# Patient Record
Sex: Female | Born: 1935 | Race: White | Hispanic: No | Marital: Married | Smoking: Never smoker
Health system: Southern US, Community
[De-identification: ages and names within clinical notes are randomized; demographics above are authoritative.]

## PROBLEM LIST (undated history)

## (undated) DIAGNOSIS — E039 Hypothyroidism, unspecified: Secondary | ICD-10-CM

## (undated) DIAGNOSIS — E785 Hyperlipidemia, unspecified: Secondary | ICD-10-CM

## (undated) DIAGNOSIS — F419 Anxiety disorder, unspecified: Secondary | ICD-10-CM

## (undated) DIAGNOSIS — M549 Dorsalgia, unspecified: Secondary | ICD-10-CM

## (undated) DIAGNOSIS — K279 Peptic ulcer, site unspecified, unspecified as acute or chronic, without hemorrhage or perforation: Secondary | ICD-10-CM

## (undated) DIAGNOSIS — Z973 Presence of spectacles and contact lenses: Secondary | ICD-10-CM

## (undated) DIAGNOSIS — K219 Gastro-esophageal reflux disease without esophagitis: Secondary | ICD-10-CM

## (undated) DIAGNOSIS — G4700 Insomnia, unspecified: Secondary | ICD-10-CM

## (undated) DIAGNOSIS — M1990 Unspecified osteoarthritis, unspecified site: Secondary | ICD-10-CM

## (undated) DIAGNOSIS — I10 Essential (primary) hypertension: Secondary | ICD-10-CM

## (undated) DIAGNOSIS — R413 Other amnesia: Secondary | ICD-10-CM

## (undated) DIAGNOSIS — I499 Cardiac arrhythmia, unspecified: Secondary | ICD-10-CM

## (undated) DIAGNOSIS — I4891 Unspecified atrial fibrillation: Secondary | ICD-10-CM

## (undated) HISTORY — DX: Gastro-esophageal reflux disease without esophagitis: K21.9

## (undated) HISTORY — DX: Unspecified atrial fibrillation: I48.91

## (undated) HISTORY — PX: ESOPHAGOGASTRODUODENOSCOPY: SHX1529

## (undated) HISTORY — PX: COLONOSCOPY: SHX174

## (undated) HISTORY — DX: Peptic ulcer, site unspecified, unspecified as acute or chronic, without hemorrhage or perforation: K27.9

## (undated) HISTORY — DX: Dorsalgia, unspecified: M54.9

## (undated) HISTORY — DX: Hypothyroidism, unspecified: E03.9

## (undated) HISTORY — PX: OTHER SURGICAL HISTORY: SHX169

## (undated) HISTORY — DX: Hyperlipidemia, unspecified: E78.5

## (undated) HISTORY — DX: Unspecified osteoarthritis, unspecified site: M19.90

---

## 1997-06-17 ENCOUNTER — Ambulatory Visit (HOSPITAL_BASED_OUTPATIENT_CLINIC_OR_DEPARTMENT_OTHER): Admission: RE | Admit: 1997-06-17 | Discharge: 1997-06-17 | Payer: Self-pay | Admitting: *Deleted

## 1997-09-23 ENCOUNTER — Ambulatory Visit (HOSPITAL_BASED_OUTPATIENT_CLINIC_OR_DEPARTMENT_OTHER): Admission: RE | Admit: 1997-09-23 | Discharge: 1997-09-23 | Payer: Self-pay | Admitting: *Deleted

## 2002-01-30 ENCOUNTER — Ambulatory Visit (HOSPITAL_COMMUNITY): Admission: RE | Admit: 2002-01-30 | Discharge: 2002-01-30 | Payer: Self-pay | Admitting: Gastroenterology

## 2002-09-05 ENCOUNTER — Inpatient Hospital Stay (HOSPITAL_COMMUNITY): Admission: EM | Admit: 2002-09-05 | Discharge: 2002-09-07 | Payer: Self-pay | Admitting: Emergency Medicine

## 2002-09-06 ENCOUNTER — Encounter (INDEPENDENT_AMBULATORY_CARE_PROVIDER_SITE_OTHER): Payer: Self-pay | Admitting: Specialist

## 2007-09-12 ENCOUNTER — Ambulatory Visit (HOSPITAL_COMMUNITY): Admission: RE | Admit: 2007-09-12 | Discharge: 2007-09-12 | Payer: Self-pay | Admitting: Family Medicine

## 2007-09-12 ENCOUNTER — Ambulatory Visit: Payer: Self-pay | Admitting: Vascular Surgery

## 2009-07-03 ENCOUNTER — Encounter: Payer: Self-pay | Admitting: Cardiology

## 2009-07-03 ENCOUNTER — Ambulatory Visit: Payer: Self-pay | Admitting: Cardiology

## 2009-07-03 ENCOUNTER — Ambulatory Visit (HOSPITAL_COMMUNITY): Admission: RE | Admit: 2009-07-03 | Discharge: 2009-07-03 | Payer: Self-pay | Admitting: Cardiology

## 2009-07-03 ENCOUNTER — Encounter (INDEPENDENT_AMBULATORY_CARE_PROVIDER_SITE_OTHER): Payer: Self-pay | Admitting: *Deleted

## 2009-07-03 ENCOUNTER — Ambulatory Visit: Payer: Self-pay

## 2009-07-03 DIAGNOSIS — E039 Hypothyroidism, unspecified: Secondary | ICD-10-CM | POA: Insufficient documentation

## 2009-07-03 DIAGNOSIS — M810 Age-related osteoporosis without current pathological fracture: Secondary | ICD-10-CM

## 2009-07-03 DIAGNOSIS — I4891 Unspecified atrial fibrillation: Secondary | ICD-10-CM | POA: Insufficient documentation

## 2009-07-03 DIAGNOSIS — K219 Gastro-esophageal reflux disease without esophagitis: Secondary | ICD-10-CM

## 2009-07-03 DIAGNOSIS — M549 Dorsalgia, unspecified: Secondary | ICD-10-CM | POA: Insufficient documentation

## 2009-07-03 DIAGNOSIS — E785 Hyperlipidemia, unspecified: Secondary | ICD-10-CM

## 2009-07-03 DIAGNOSIS — M1710 Unilateral primary osteoarthritis, unspecified knee: Secondary | ICD-10-CM

## 2009-07-11 DIAGNOSIS — R079 Chest pain, unspecified: Secondary | ICD-10-CM

## 2009-07-14 ENCOUNTER — Telehealth: Payer: Self-pay | Admitting: Cardiology

## 2009-07-21 ENCOUNTER — Ambulatory Visit: Payer: Self-pay | Admitting: Cardiology

## 2009-07-21 DIAGNOSIS — I10 Essential (primary) hypertension: Secondary | ICD-10-CM

## 2009-07-25 ENCOUNTER — Ambulatory Visit: Payer: Self-pay | Admitting: Internal Medicine

## 2009-07-25 LAB — CONVERTED CEMR LAB: POC INR: 1.3

## 2009-07-31 ENCOUNTER — Ambulatory Visit: Payer: Self-pay | Admitting: Cardiology

## 2009-08-01 ENCOUNTER — Telehealth: Payer: Self-pay | Admitting: Cardiology

## 2009-08-02 ENCOUNTER — Emergency Department (HOSPITAL_COMMUNITY): Admission: EM | Admit: 2009-08-02 | Discharge: 2009-08-02 | Payer: Self-pay | Admitting: Emergency Medicine

## 2009-08-07 ENCOUNTER — Ambulatory Visit: Payer: Self-pay | Admitting: Internal Medicine

## 2009-08-14 ENCOUNTER — Ambulatory Visit: Payer: Self-pay

## 2009-08-25 ENCOUNTER — Ambulatory Visit: Payer: Self-pay | Admitting: Cardiology

## 2009-08-25 LAB — CONVERTED CEMR LAB: POC INR: 1.6

## 2009-09-01 ENCOUNTER — Ambulatory Visit: Payer: Self-pay | Admitting: Cardiology

## 2009-09-01 LAB — CONVERTED CEMR LAB: POC INR: 1.7

## 2009-09-11 ENCOUNTER — Ambulatory Visit: Payer: Self-pay | Admitting: Internal Medicine

## 2009-09-17 ENCOUNTER — Ambulatory Visit: Payer: Self-pay | Admitting: Internal Medicine

## 2009-09-17 ENCOUNTER — Ambulatory Visit: Payer: Self-pay | Admitting: Cardiology

## 2009-09-29 ENCOUNTER — Ambulatory Visit: Payer: Self-pay | Admitting: Cardiology

## 2009-10-13 ENCOUNTER — Ambulatory Visit: Payer: Self-pay | Admitting: Cardiovascular Disease

## 2009-10-13 LAB — CONVERTED CEMR LAB: POC INR: 1.8

## 2009-10-27 ENCOUNTER — Ambulatory Visit: Payer: Self-pay | Admitting: Cardiology

## 2009-11-12 ENCOUNTER — Ambulatory Visit: Payer: Self-pay | Admitting: Cardiovascular Disease

## 2009-11-24 ENCOUNTER — Ambulatory Visit: Payer: Self-pay | Admitting: Cardiovascular Disease

## 2009-11-24 LAB — CONVERTED CEMR LAB: POC INR: 2.6

## 2009-12-15 ENCOUNTER — Ambulatory Visit: Payer: Self-pay | Admitting: Cardiology

## 2009-12-15 LAB — CONVERTED CEMR LAB: POC INR: 2.7

## 2010-01-12 ENCOUNTER — Ambulatory Visit: Payer: Self-pay | Admitting: Internal Medicine

## 2010-01-12 LAB — CONVERTED CEMR LAB
INR: 2.8
POC INR: 2.8

## 2010-02-09 ENCOUNTER — Ambulatory Visit: Payer: Self-pay | Admitting: Internal Medicine

## 2010-03-05 ENCOUNTER — Ambulatory Visit: Payer: Self-pay | Admitting: Cardiology

## 2010-03-05 LAB — CONVERTED CEMR LAB: POC INR: 2.6

## 2010-04-02 ENCOUNTER — Ambulatory Visit: Admission: RE | Admit: 2010-04-02 | Discharge: 2010-04-02 | Payer: Self-pay | Source: Home / Self Care

## 2010-04-07 NOTE — Medication Information (Signed)
Summary: rov/sp  Anticoagulant Therapy  Managed by: Weston Brass, PharmD Referring MD: Daleen Squibb PCP: Dewain Penning Supervising MD: Antoine Poche MD, Fayrene Fearing Indication 1: Atrial Fibrillation Lab Used: [facility] [place] Site: [address] INR POC 2.3 INR RANGE 2.0-3.0  Dietary changes: no    Health status changes: no    Bleeding/hemorrhagic complications: no    Recent/future hospitalizations: no    Any changes in medication regimen? no    Recent/future dental: no  Any missed doses?: no       Is patient compliant with meds? yes       Allergies: 1)  ! Asa 2)  ! Codeine 3)  ! Sulfa  Anticoagulation Management History:      The patient is taking warfarin and comes in today for a routine follow up visit.  Positive risk factors for bleeding include an age of 75 years or older.  The bleeding index is 'intermediate risk'.  Positive CHADS2 values include History of HTN and Age > 18 years old.  Anticoagulation responsible provider: Antoine Poche MD, Fayrene Fearing.  INR POC: 2.3.  Cuvette Lot#: [id number].  Exp: 12/2010.    Anticoagulation Management Assessment/Plan:      The patient's current anticoagulation dose is Warfarin sodium 2.5 mg tabs: 1 once daily  AS DIRECTED.  The target INR is 2.0-3.0.  The next INR is due 11/12/2009.  Anticoagulation instructions were given to patient.  Results were reviewed/authorized by Weston Brass, PharmD.  She was notified by Liana Gerold, PharmD Candidate.         Prior Anticoagulation Instructions: INR 1.8  Take 2 1/2 tablets today then increase dose to 2 tablets every day except 1 1/2 tablets on Tuesday and Saturday.   Current Anticoagulation Instructions: INR 2.3  Continue 2 tablets daily except 1.5 tablets on Tue and Sat.  Return to clinic in 2 weeks.

## 2010-04-07 NOTE — Medication Information (Signed)
Summary: rov/cb  Anticoagulant Therapy  Managed by: Cloyde Reams, RN, BSN Referring MD: Daleen Squibb PCP: Dewain Penning Supervising MD: Ladona Ridgel MD, Sharlot Gowda Indication 1: Atrial Fibrillation Lab Used: [facility] [place] Site: [address] INR POC 1.6 INR RANGE 2.0-3.0  Dietary changes: no    Health status changes: no    Bleeding/hemorrhagic complications: no    Recent/future hospitalizations: no    Any changes in medication regimen? no    Recent/future dental: no  Any missed doses?: no       Is patient compliant with meds? yes       Allergies: 1)  ! Asa 2)  ! Codeine 3)  ! Sulfa  Anticoagulation Management History:      The patient is taking warfarin and comes in today for a routine follow up visit.  Positive risk factors for bleeding include an age of 75 years or older.  The bleeding index is 'intermediate risk'.  Positive CHADS2 values include History of HTN and Age > 12 years old.  Anticoagulation responsible provider: Ladona Ridgel MD, Sharlot Gowda.  INR POC: 1.6.  Cuvette Lot#: [id number].  Exp: 11/2010.    Anticoagulation Management Assessment/Plan:      The patient's current anticoagulation dose is Warfarin sodium 2.5 mg tabs: 1 once daily  AS DIRECTED.  The target INR is 2.0-3.0.  The next INR is due 09/17/2009.  Anticoagulation instructions were given to patient.  Results were reviewed/authorized by Cloyde Reams, RN, BSN.  She was notified by Cloyde Reams, RN, BSN.         Prior Anticoagulation Instructions: INR 1.7. Take 2 tablets today, then take 1.5 tablets daily except 1 tablet on Mon and Fri. Recheck in 7-10 days.  Current Anticoagulation Instructions: INR 1.6  Start taking 1.5 tablets daily. Recheck in 10 days.

## 2010-04-07 NOTE — Medication Information (Signed)
Summary: rov/sp  Anticoagulant Therapy  Managed by: Eda Keys, PharmD Referring MD: Daleen Squibb PCP: Dewain Penning Supervising MD: Shirlee Latch MD, Hodari Chuba Indication 1: Atrial Fibrillation Lab Used: [facility] [place] Site: [address] INR POC 1.6 INR RANGE 2.0-3.0  Dietary changes: yes       Details: A couple extra servings of cabbage and slaw this week.    Health status changes: no    Bleeding/hemorrhagic complications: no    Recent/future hospitalizations: no    Any changes in medication regimen? no    Recent/future dental: no  Any missed doses?: no       Is patient compliant with meds? yes      Comments: Patient has been taking 1.5 tablets only on TTS, vs STTS as recorded at last visit.   Allergies: 1)  ! Asa 2)  ! Codeine 3)  ! Sulfa  Anticoagulation Management History:      The patient is taking warfarin and comes in today for a routine follow up visit.  Positive risk factors for bleeding include an age of 5 years or older.  The bleeding index is 'intermediate risk'.  Positive CHADS2 values include History of HTN and Age > 14 years old.  Anticoagulation responsible provider: Shirlee Latch MD, Kholton Coate.  INR POC: 1.6.  Cuvette Lot#: [id number].  Exp: 11/2010.    Anticoagulation Management Assessment/Plan:      The patient's current anticoagulation dose is Warfarin sodium 2.5 mg tabs: 1 once daily  AS DIRECTED.  The target INR is 2.0-3.0.  The next INR is due 09/01/2009.  Anticoagulation instructions were given to patient.  Results were reviewed/authorized by Eda Keys, PharmD.  She was notified by Eda Keys.         Prior Anticoagulation Instructions: INR 1.7  Take 2 tablets today then increase dose to 1 1/2 tablets every day except 1 tablet on Monday, Wednesday and Friday   Current Anticoagulation Instructions: INR 1.6  Take 2 tablets today.  Then start NEW dosing schedule of 1 tablet on Monday, Wednesday and Friday and 1.5 tablets on Sunday,  Tuesday, Thursday, and Saturday.  Return to clinic in 1 week.

## 2010-04-07 NOTE — Assessment & Plan Note (Signed)
Summary: per check out/sf   Visit Type:  rov Primary Provider:  Dewain Penning  CC:  fatigue.....  History of Present Illness: Amanda [name] comes in today for further evaluation and management of her tachycardia and palpitations. she carries a diagnosis of atrial fibrillation.  Since increasing her metoprolol to 50 mg a day, she has had very few symptoms. She may have a little bit more fatigue but it is not a significant issue per her daughter. Her biggest complaint is that of constipation. She takes milk of magnesia with resultant diarrhea.  She is tolerating Coumadin well. No bleeding.  Her blood pressure has also been under better control.  Current Medications (verified): 1)  Calcium-Vitamin D 600-200 Mg-Unit Tabs (Calcium-Vitamin D) .Marland Kitchen.. 1 Tab Three Times A Day 2)  Centrum  Tabs (Multiple Vitamins-Minerals) .Marland Kitchen.. 1 Tab Once Daily 3)  Fish Oil 1000 Mg Caps (Omega-3 Fatty Acids) .Marland Kitchen.. 1 Cap Two Times A Day 4)  Garlic Oil 1000 Mg Caps (Garlic) .Marland Kitchen.. 1 Cap Once Daily 5)  Lorazepam 0.5 Mg Tabs (Lorazepam) .... 1/2-1 Tab At Bedtime As Needed 6)  Red Yeast Rice 600 Mg Caps (Red Yeast Rice Extract) .Marland Kitchen.. 1 Cap Once Daily 7)  Synthroid 75 Mcg Tabs (Levothyroxine Sodium) .Marland Kitchen.. 1 Tab Once Daily 8)  Turmeric Curcumin  Caps (Misc Natural Products) .Marland Kitchen.. 1 Cap Once Daily 9)  Vitamin D 400 Unit Caps (Cholecalciferol) .Marland Kitchen.. 1 Cap Once Daily 10)  Vitamin E 400 Unit Caps (Vitamin E) .Marland Kitchen.. 1 Cap Once Daily 11)  Folic Acid 400 Mcg Tabs (Folic Acid) .Marland Kitchen.. 1 Tab Once Daily 12)  Cinnamon 500 Mg Caps (Cinnamon) .Marland Kitchen.. 1 Cap Once Daily 13)  Metoprolol Succinate 50 Mg Xr24h-Tab (Metoprolol Succinate) .Marland Kitchen.. 1 Once Daily 14)  Warfarin Sodium 2.5 Mg Tabs (Warfarin Sodium) .Marland Kitchen.. 1 Once Daily  As Directed  Allergies: 1)  ! Asa 2)  ! Codeine 3)  ! Sulfa  Past History:  Past Medical History: Last updated: 07/11/2009 ATRIAL FIBRILLATION (ICD-427.31) CHEST PAIN (ICD-786.50) HYPERLIPIDEMIA (ICD-272.4) GERD  (ICD-530.81) BACK PAIN (ICD-724.5) OSTEOARTHRITIS, KNEE (ICD-715.96) HYPOTHYROIDISM (ICD-244.9) OSTEOPOROSIS (ICD-733.00)    Past Surgical History: Last updated: 07/03/2009 Thyroid Arthritis Ulcer surgery  Family History: Last updated: 07/03/2009 Family History of Cancer: Kidney Family History of Coronary Artery Disease:  Fx h/o arthritis  Social History: Last updated: 07/03/2009 Tobacco Use - No.  Alcohol Use - no Drug Use - no  Risk Factors: Smoking Status: never (07/03/2009)  Review of Systems       negative other than history of present illness  Vital Signs:  Patient profile:   77 year old female Height:      63 inches Weight:      123 pounds BMI:     21.87 Pulse rate:   88 / minute Pulse rhythm:   regular BP sitting:   120 / 80  (left arm) Cuff size:   large  Vitals Entered By: Danielle Rankin, CMA (September 17, 2009 1:52 PM)  Physical Exam  General:  Well developed, well nourished, in no acute distress. Head:  normocephalic and atraumatic Eyes:  PERRLA/EOM intact; conjunctiva and lids normal. Neck:  Neck supple, no JVD. No masses, thyromegaly or abnormal cervical nodes. Chest Amanda [name]:  no deformities or breast masses noted Lungs:  Clear bilaterally to auscultation and percussion. Heart:  Non-displaced PMI, chest non-tender; regular rate and rhythm, S1, S2 without murmurs, rubs or gallops. Carotid upstroke normal, no bruit. Normal abdominal aortic size, no bruits. Femorals normal pulses, no bruits.  Pedals normal pulses. No edema, no varicosities. Msk:  Back normal, normal gait. Muscle strength and tone normal. Pulses:  pulses normal in all 4 extremities Extremities:  No clubbing or cyanosis. Neurologic:  Alert and oriented x 3. Skin:  Intact without lesions or rashes. Psych:  Normal affect.   Impression & Recommendations:  Problem # 1:  HYPERTENSION, UNSPECIFIED (ICD-401.9) Assessment Improved  Her updated medication list for this problem includes:     Metoprolol Succinate 50 Mg Xr24h-tab (Metoprolol succinate) .Marland Kitchen... 1 once daily  Problem # 2:  ENCOUNTER FOR LONG-TERM USE OF OTHER MEDICATIONS (ICD-V58.69)  Problem # 3:  ATRIAL FIBRILLATION (ICD-427.31) Assessment: Unchanged  Her updated medication list for this problem includes:    Metoprolol Succinate 50 Mg Xr24h-tab (Metoprolol succinate) .Marland Kitchen... 1 once daily    Warfarin Sodium 2.5 Mg Tabs (Warfarin sodium) .Marland Kitchen... 1 once daily  as directed  Problem # 4:  CHEST PAIN (ICD-786.50) Assessment: Improved  Her updated medication list for this problem includes:    Metoprolol Succinate 50 Mg Xr24h-tab (Metoprolol succinate) .Marland Kitchen... 1 once daily    Warfarin Sodium 2.5 Mg Tabs (Warfarin sodium) .Marland Kitchen... 1 once daily  as directed  Patient Instructions: 1)  Your physician recommends that you schedule a follow-up appointment in: 1 year with Dr. Daleen Squibb 2)  Your physician recommends that you continue on your current medications as directed. Please refer to the Current Medication list given to you today. 3)  You may take an over the counter stool softner (docusate sodium) for constipation.  If that does not help you may take otc  Miralax

## 2010-04-07 NOTE — Assessment & Plan Note (Signed)
Summary: np6/atrial fib/jml   Visit Type:  DOD ADD-ON Primary Provider:  Dewain Penning  CC:  pt was seen today at Dr. Yehuda Budd office and had an EKG done today which showed Afib so pt was referred over to see Dr. Daleen Squibb today...pt denies any cardiac complaints today.  History of Present Illness: Amanda [name] comes in today for evaluation and management of new onset atrial fibrillation.  She went to Dr.Spear's office today for blood work and have her knee injected. She's found to be in atrial fibrillation. Her electrocardiogram shows a heart rate of about 90 beats per minute with no specific ST segment changes.  She is totally asymptomatic. She says she's had heart quivers of brief duration for years but nothing being found. She denies any history of stroke, diabetes, history of congestive heart failure, coronary disease, or vascular disease  She is very active working in her yard raking and working her flowers.  She has a history of black tarry stools years ago. At the time she was taking 81 mg of aspirin. Endoscopy showed a peptic ulcer. Her daughter thinks that it may have been caused by bacteria and perhaps aspirin may worse. Her aspirin was stopped at that time. She has had no bleeding since.  She really doesn't want to take Coumadin.   Current Medications (verified): 1)  Calcium-Vitamin D 600-200 Mg-Unit Tabs (Calcium-Vitamin D) .Marland Kitchen.. 1 Tab Three Times A Day 2)  Centrum  Tabs (Multiple Vitamins-Minerals) .Marland Kitchen.. 1 Tab Once Daily 3)  Fish Oil 1000 Mg Caps (Omega-3 Fatty Acids) .Marland Kitchen.. 1 Cap Two Times A Day 4)  Garlic Oil 1000 Mg Caps (Garlic) .Marland Kitchen.. 1 Cap Once Daily 5)  Lorazepam 0.5 Mg Tabs (Lorazepam) .... 1/2-1 Tab At Bedtime As Needed 6)  Red Yeast Rice 600 Mg Caps (Red Yeast Rice Extract) .Marland Kitchen.. 1 Cap Once Daily 7)  Synthroid 75 Mcg Tabs (Levothyroxine Sodium) .Marland Kitchen.. 1 Tab Once Daily 8)  Turmeric Curcumin  Caps (Misc Natural Products) .Marland Kitchen.. 1 Cap Once Daily 9)  Vitamin D 400 Unit Caps  (Cholecalciferol) .Marland Kitchen.. 1 Cap Once Daily 10)  Vitamin E 400 Unit Caps (Vitamin E) .Marland Kitchen.. 1 Cap Once Daily 11)  Folic Acid 400 Mcg Tabs (Folic Acid) .Marland Kitchen.. 1 Tab Once Daily 12)  Cinnamon 500 Mg Caps (Cinnamon) .Marland Kitchen.. 1 Cap Once Daily  Allergies (verified): 1)  ! Asa 2)  ! Codeine 3)  ! Sulfa  Review of Systems       negative other than history of present illness  Vital Signs:  Patient profile:   77 year old female Height:      63 inches Weight:      126 pounds BMI:     22.40 Pulse rate:   98 / minute Pulse rhythm:   regular BP sitting:   132 / 80  (left arm) Cuff size:   large  Vitals Entered By: Danielle Rankin, CMA (July 03, 2009 1:57 PM)  Physical Exam  General:  Well developed, well nourished, in no acute distress. Head:  normocephalic and atraumatic Eyes:  PERRLA/EOM intact; conjunctiva and lids normal. Neck:  Neck supple, no JVD. No masses, thyromegaly or abnormal cervical nodes. Chest Montrell Cessna:  no deformities or breast masses noted Lungs:  Clear bilaterally to auscultation and percussion. Heart:  PMI nondisplaced, regular rate and rhythm, no murmur rub or gallop. Carotids are normal without bruits Abdomen:  normal with no midline bruit Msk:  decreased ROM.   Pulses:  pulses normal in all 4 extremities Extremities:  No clubbing or cyanosis. Neurologic:  Alert and oriented x 3. Skin:  Intact without lesions or rashes. Psych:  Normal affect.   EKG  Procedure date:  07/03/2009  Findings:      atrial fibrillation, rate about 90-95 beats per minute, nonspecific ST segment changes.  Impression & Recommendations:  Problem # 1:  ATRIAL FIBRILLATION (ICD-427.31) Assessment New She is relatively asymptomatic. She has occasional heart quivers but has had these for years. Her risk of thromboembolic stroke is low if she has a normal 2-D echocardiogram. We are going to obtain a 2-D echo today. She is not very active and taking Coumadin but is willing to try enteric-coated aspirin  despite the above history. We could probably give her 81 mg of enteric coated aspirin per day with meals and watch for any sign of bleeding. Her daughter's cough with this as well.  Rate control may be an issue as well. I would like to try low dose of a beta blocker such as metoprolol succinate 25 mg a day.  We will bring her back in about 3-4 weeks to followup history further questions.  Problem # 2:  HYPOTHYROIDISM (ICD-244.9) Dr. Collins Scotland is checking blood work including a TSH. Her updated medication list for this problem includes:    Synthroid 75 Mcg Tabs (Levothyroxine sodium) .Marland Kitchen... 1 tab once daily  Other Orders: Echocardiogram (Echo)  Patient Instructions: 1)  Your physician recommends that you schedule a follow-up appointment in: 3-4 WEEKS  WITH DR Zeina Akkerman 2)  Your physician has recommended you make the following change in your medication: METOPROLOL SUCC 25 MG once daily 3)  Your physician has requested that you have an echocardiogram.  Echocardiography is a painless test that uses sound waves to create images of your heart. It provides your doctor with information about the size and shape of your heart and how well your heart's chambers and valves are working.  This procedure takes approximately one hour. There are no restrictions for this procedure.IF ECHO OKAY START ENTERIC COATED ASA 81 MG  once daily

## 2010-04-07 NOTE — Medication Information (Signed)
Summary: rov/eac  Anticoagulant Therapy  Managed by: Elaina Pattee, PharmD Referring MD: Daleen Squibb PCP: Dewain Penning Supervising MD: Jens Som MD, Arlys John Indication 1: Atrial Fibrillation Lab Used: [facility]  Site: [address] INR POC 1.7 INR RANGE 2.0-3.0  Dietary changes: no    Health status changes: no    Bleeding/hemorrhagic complications: no    Recent/future hospitalizations: no    Any changes in medication regimen? no    Recent/future dental: no  Any missed doses?: no       Is patient compliant with meds? yes       Allergies: 1)  ! Asa 2)  ! Codeine 3)  ! Sulfa  Anticoagulation Management History:      The patient is taking warfarin and comes in today for a routine follow up visit.  Positive risk factors for bleeding include an age of 75 years or older.  The bleeding index is 'intermediate risk'.  Positive CHADS2 values include History of HTN and Age > 70 years old.  Anticoagulation responsible provider: Jens Som MD, Arlys John.  INR POC: 1.7.  Cuvette Lot#: [id number].  Exp: 11/2010.    Anticoagulation Management Assessment/Plan:      The patient's current anticoagulation dose is Warfarin sodium 2.5 mg tabs: 1 once daily  AS DIRECTED.  The target INR is 2.0-3.0.  The next INR is due 09/11/2009.  Anticoagulation instructions were given to patient.  Results were reviewed/authorized by Elaina Pattee, PharmD.  She was notified by Elaina Pattee, PharmD.         Prior Anticoagulation Instructions: INR 1.6  Take 2 tablets today.  Then start NEW dosing schedule of 1 tablet on Monday, Wednesday and Friday and 1.5 tablets on Sunday, Tuesday, Thursday, and Saturday.  Return to clinic in 1 week.    Current Anticoagulation Instructions: INR 1.7. Take 2 tablets today, then take 1.5 tablets daily except 1 tablet on Mon and Fri. Recheck in 7-10 days.

## 2010-04-07 NOTE — Medication Information (Signed)
Summary: rov/tm  Anticoagulant Therapy  Managed by: Bethena Midget, RN, BSN Referring MD: Daleen Squibb PCP: Dewain Penning Supervising MD: Riley Kill MD, Maisie Fus Indication 1: Atrial Fibrillation Lab Used: [facility] [place] Site: [address] INR POC 1.7 INR RANGE 2.0-3.0  Dietary changes: no    Health status changes: no    Bleeding/hemorrhagic complications: no    Recent/future hospitalizations: no    Any changes in medication regimen? no    Recent/future dental: no  Any missed doses?: no       Is patient compliant with meds? yes       Allergies: 1)  ! Asa 2)  ! Codeine 3)  ! Sulfa  Anticoagulation Management History:      The patient is taking warfarin and comes in today for a routine follow up visit.  Positive risk factors for bleeding include an age of 75 years or older.  The bleeding index is 'intermediate risk'.  Positive CHADS2 values include History of HTN and Age > 33 years old.  Anticoagulation responsible provider: Riley Kill MD, Maisie Fus.  INR POC: 1.7.  Cuvette Lot#: [id number].  Exp: 12/2010.    Anticoagulation Management Assessment/Plan:      The patient's current anticoagulation dose is Warfarin sodium 2.5 mg tabs: 1 once daily  AS DIRECTED.  The target INR is 2.0-3.0.  The next INR is due 10/13/2009.  Anticoagulation instructions were given to patient.  Results were reviewed/authorized by Bethena Midget, RN, BSN.  She was notified by Bethena Midget, RN, BSN.         Prior Anticoagulation Instructions: INR 1.6 Today take 2 pills then change dose to 1 1/2 pills everyday except 2 pills on Thursdays. Recheck in 10 days.   Current Anticoagulation Instructions: INR 1.7 Change dose to 1.5 pills everyday except 2 pills on Mondays, Wednesdays and Fridays. Recheck in 2 weeks.

## 2010-04-07 NOTE — Progress Notes (Signed)
Summary: echo results  Phone Note Call from Patient Call back at Home Phone [phone]   Caller: Patient Reason for Call: Lab or Test Results Summary of Call: request results of echo Initial call taken by: Migdalia Dk,  Jul 14, 2009 8:06 AM  Follow-up for Phone Call        Spoke with Mrs. Jared and she is aware of ECHO results.  She has a scheduled appt. with Dr. Daleen Squibb on 07/21/09. Lisabeth Devoid RN

## 2010-04-07 NOTE — Medication Information (Signed)
Summary: rov/sl  Anticoagulant Therapy  Managed by: Louann Sjogren, PharmD Referring MD: Daleen Squibb PCP: Dewain Penning Supervising MD: Allred MD,James Indication 1: Atrial Fibrillation Lab Used: [facility] [place] Site: [address] INR POC 2.8 INR RANGE 2.0-3.0  Dietary changes: no    Health status changes: no    Bleeding/hemorrhagic complications: no    Recent/future hospitalizations: no    Any changes in medication regimen? no    Recent/future dental: yes     Details: Tooth pulled 11/8; fax INR to dentist  Any missed doses?: no       Is patient compliant with meds? yes       Current Medications (verified): 1)  Calcium-Vitamin D 600-200 Mg-Unit Tabs (Calcium-Vitamin D) .Marland Kitchen.. 1 Tab Three Times A Day 2)  Centrum  Tabs (Multiple Vitamins-Minerals) .Marland Kitchen.. 1 Tab Once Daily 3)  Fish Oil 1000 Mg Caps (Omega-3 Fatty Acids) .Marland Kitchen.. 1 Cap Two Times A Day 4)  Garlic Oil 1000 Mg Caps (Garlic) .Marland Kitchen.. 1 Cap Once Daily 5)  Lorazepam 0.5 Mg Tabs (Lorazepam) .... 1/2-1 Tab At Bedtime As Needed 6)  Red Yeast Rice 600 Mg Caps (Red Yeast Rice Extract) .Marland Kitchen.. 1 Cap Once Daily 7)  Synthroid 75 Mcg Tabs (Levothyroxine Sodium) .Marland Kitchen.. 1 Tab Once Daily 8)  Turmeric Curcumin  Caps (Misc Natural Products) .Marland Kitchen.. 1 Cap Once Daily 9)  Vitamin D 400 Unit Caps (Cholecalciferol) .Marland Kitchen.. 1 Cap Once Daily 10)  Vitamin E 400 Unit Caps (Vitamin E) .Marland Kitchen.. 1 Cap Once Daily 11)  Folic Acid 400 Mcg Tabs (Folic Acid) .Marland Kitchen.. 1 Tab Once Daily 12)  Cinnamon 500 Mg Caps (Cinnamon) .Marland Kitchen.. 1 Cap Once Daily 13)  Metoprolol Succinate 50 Mg Xr24h-Tab (Metoprolol Succinate) .Marland Kitchen.. 1 Once Daily 14)  Warfarin Sodium 2.5 Mg Tabs (Warfarin Sodium) .Marland Kitchen.. 1 Once Daily  As Directed  Allergies (verified): 1)  ! Asa 2)  ! Codeine 3)  ! Sulfa  Anticoagulation Management History:      The patient is taking warfarin and comes in today for a routine follow up visit.  Positive risk factors for bleeding include an age of 75 years or older.   The bleeding index is 'intermediate risk'.  Positive CHADS2 values include History of HTN and Age > 10 years old.  Today's INR is 2.8.  Anticoagulation responsible provider: Allred MD,James.  INR POC: 2.8.  Cuvette Lot#: [id number].  Exp: 12/2010.    Anticoagulation Management Assessment/Plan:      The patient's current anticoagulation dose is Warfarin sodium 2.5 mg tabs: 1 once daily  AS DIRECTED.  The target INR is 2.0-3.0.  The next INR is due 01/11/2011.  Anticoagulation instructions were given to patient.  Results were reviewed/authorized by Louann Sjogren, PharmD.         Prior Anticoagulation Instructions: INR 2.7  Continue taking Coumadin 2 tabs (5 mg) all days except Coumadin 1.5 tabs (3.75 mg).  Return to clinic in 4 weeks.   Current Anticoagulation Instructions: INR 2.8  Continue current regimen of 2 tablets daily except 1.5 tablets on Tuesday.

## 2010-04-07 NOTE — Medication Information (Signed)
Summary: rov/sp  Anticoagulant Therapy  Managed by: Weston Brass, PharmD Referring MD: Daleen Squibb PCP: Dewain Penning Supervising MD: Tenny Craw MD, Gunnar Fusi Indication 1: Atrial Fibrillation Lab Used: [facility] [place] Site: [address] INR POC 1.7 INR RANGE 2.0-3.0  Dietary changes: no    Health status changes: yes       Details: stools have returned to normal since off abx   Bleeding/hemorrhagic complications: no    Recent/future hospitalizations: no    Any changes in medication regimen? yes       Details: finished cephalexin  Recent/future dental: no  Any missed doses?: no       Is patient compliant with meds? yes       Allergies: 1)  ! Asa 2)  ! Codeine 3)  ! Sulfa  Anticoagulation Management History:      The patient is taking warfarin and comes in today for a routine follow up visit.  Positive risk factors for bleeding include an age of 75 years or older.  The bleeding index is 'intermediate risk'.  Positive CHADS2 values include History of HTN and Age > 75 years old.  Anticoagulation responsible provider: Tenny Craw MD, Gunnar Fusi.  INR POC: 1.7.  Cuvette Lot#: [id number].  Exp: 10/2010.    Anticoagulation Management Assessment/Plan:      The patient's current anticoagulation dose is Warfarin sodium 2.5 mg tabs: 1 once daily  AS DIRECTED.  The target INR is 2.0-3.0.  The next INR is due 08/25/2009.  Anticoagulation instructions were given to patient.  Results were reviewed/authorized by Weston Brass, PharmD.  She was notified by Weston Brass PharmD.         Prior Anticoagulation Instructions: INR 1.7  Increase dose to 1 tablet every day except 1 1/2 tablets on Tuesday, Thursday, and Saturday  Current Anticoagulation Instructions: INR 1.7  Take 2 tablets today then increase dose to 1 1/2 tablets every day except 1 tablet on Monday, Wednesday and Friday

## 2010-04-07 NOTE — Miscellaneous (Signed)
  Clinical Lists Changes  Medications: Added new medication of METOPROLOL SUCCINATE 25 MG XR24H-TAB (METOPROLOL SUCCINATE) 1 once daily - Signed Rx of METOPROLOL SUCCINATE 25 MG XR24H-TAB (METOPROLOL SUCCINATE) 1 once daily;  #30 x 11;  Signed;  Entered by: Scherrie Bateman, LPN;  Authorized by: Gaylord Shih, MD, [facility];  Method used: Electronically to [facility]  [phone]*, [address], [place], [place], [place]  [address], Ph: [phone] or [phone], Fax: [phone]    Prescriptions: METOPROLOL SUCCINATE 25 MG XR24H-TAB (METOPROLOL SUCCINATE) 1 once daily  #30 x 11   Entered by:   Scherrie Bateman, LPN   Authorized by:   Gaylord Shih, MD, [facility]   Signed by:   Scherrie Bateman, LPN on 76/19/5093   Method used:   Electronically to        [facility]  [phone]* (retail)       [address]       [place], [place]  [address]       Ph: [phone] or [phone]       Fax: [phone]   RxID:   [id number]

## 2010-04-07 NOTE — Medication Information (Signed)
Summary: rov/ewj  Anticoagulant Therapy  Managed by: Bethena Midget, RN, BSN Referring MD: Daleen Squibb PCP: Dewain Penning Supervising MD: Johney Frame MD, Fayrene Fearing Indication 1: Atrial Fibrillation Lab Used: [facility] [place] Site: [address] INR POC 1.6 INR RANGE 2.0-3.0  Dietary changes: no    Health status changes: no    Bleeding/hemorrhagic complications: no    Recent/future hospitalizations: no    Any changes in medication regimen? no    Recent/future dental: no  Any missed doses?: no       Is patient compliant with meds? yes       Allergies: 1)  ! Asa 2)  ! Codeine 3)  ! Sulfa  Anticoagulation Management History:      The patient is taking warfarin and comes in today for a routine follow up visit.  Positive risk factors for bleeding include an age of 75 years or older.  The bleeding index is 'intermediate risk'.  Positive CHADS2 values include History of HTN and Age > 42 years old.  Anticoagulation responsible provider: Timothy Trudell MD, Fayrene Fearing.  INR POC: 1.6.  Cuvette Lot#: [id number].  Exp: 11/2010.    Anticoagulation Management Assessment/Plan:      The patient's current anticoagulation dose is Warfarin sodium 2.5 mg tabs: 1 once daily  AS DIRECTED.  The target INR is 2.0-3.0.  The next INR is due 09/29/2009.  Anticoagulation instructions were given to patient.  Results were reviewed/authorized by Bethena Midget, RN, BSN.  She was notified by Bethena Midget, RN, BSN.         Prior Anticoagulation Instructions: INR 1.6  Start taking 1.5 tablets daily. Recheck in 10 days.    Current Anticoagulation Instructions: INR 1.6 Today take 2 pills then change dose to 1 1/2 pills everyday except 2 pills on Thursdays. Recheck in 10 days.

## 2010-04-07 NOTE — Medication Information (Signed)
Summary: rov/eac  Anticoagulant Therapy  Managed by: Weston Brass, PharmD Referring MD: Daleen Squibb PCP: Dewain Penning Supervising MD: Graciela Husbands MD, Viviann Spare Indication 1: Atrial Fibrillation Lab Used: [facility] [place] Site: [address] INR POC 1.7 INR RANGE 2.0-3.0  Dietary changes: no    Health status changes: yes       Details: went to ER over the weekend for UTI  Bleeding/hemorrhagic complications: yes       Details: pt noticed some darker stools.  Has a history of ulcer that was over 5 years ago.  Suggested she f/u with Dr. Yehuda Budd and possible hemmocult stools to determine if there is any blood in the stool  Recent/future hospitalizations: no    Any changes in medication regimen? yes       Details: on cephalexin for UTI- has about 6 days left  Recent/future dental: no  Any missed doses?: no       Is patient compliant with meds? yes       Allergies: 1)  ! Asa 2)  ! Codeine 3)  ! Sulfa  Anticoagulation Management History:      The patient is taking warfarin and comes in today for a routine follow up visit.  Positive risk factors for bleeding include an age of 75 years or older.  The bleeding index is 'intermediate risk'.  Positive CHADS2 values include History of HTN and Age > 44 years old.  Anticoagulation responsible provider: Graciela Husbands MD, Viviann Spare.  INR POC: 1.7.  Cuvette Lot#: [id number].  Exp: 10/2010.    Anticoagulation Management Assessment/Plan:      The patient's current anticoagulation dose is Warfarin sodium 2.5 mg tabs: 1 once daily  AS DIRECTED.  The target INR is 2.0-3.0.  The next INR is due 08/14/2009.  Anticoagulation instructions were given to patient.  Results were reviewed/authorized by Weston Brass, PharmD.  She was notified by Weston Brass PharmD.         Prior Anticoagulation Instructions: INR 1.7  Start NEW dosing schedule of 1.5 tablets on Thursday and Tuesday and 1 tablet all other days.  Return to clinic in 1 week.    Current Anticoagulation  Instructions: INR 1.7  Increase dose to 1 tablet every day except 1 1/2 tablets on Tuesday, Thursday, and Saturday

## 2010-04-07 NOTE — Medication Information (Signed)
Summary: rov/sp  Anticoagulant Therapy  Managed by: Eda Keys, PharmD Referring MD: Daleen Squibb PCP: Dewain Penning Supervising MD: Daleen Squibb MD, Maisie Fus Indication 1: Atrial Fibrillation Lab Used: [facility] [place] Site: [address] INR POC 1.7 INR RANGE 2.0-3.0  Dietary changes: no    Health status changes: no    Bleeding/hemorrhagic complications: no    Recent/future hospitalizations: no    Any changes in medication regimen? no    Recent/future dental: no  Any missed doses?: no       Is patient compliant with meds? yes       Allergies: 1)  ! Asa 2)  ! Codeine 3)  ! Sulfa  Anticoagulation Management History:      The patient is taking warfarin and comes in today for a routine follow up visit.  Positive risk factors for bleeding include an age of 74 years or older.  The bleeding index is 'intermediate risk'.  Positive CHADS2 values include History of HTN and Age > 73 years old.  Anticoagulation responsible provider: Daleen Squibb MD, Maisie Fus.  INR POC: 1.7.  Cuvette Lot#: [id number].  Exp: 10/2010.    Anticoagulation Management Assessment/Plan:      The patient's current anticoagulation dose is Warfarin sodium 2.5 mg tabs: 1 once daily  AS DIRECTED.  The target INR is 2.0-3.0.  The next INR is due 08/07/2009.  Anticoagulation instructions were given to patient.  Results were reviewed/authorized by Eda Keys, PharmD.  She was notified by Eda Keys.         Prior Anticoagulation Instructions: INR 1.3  Take 1 1/2 tablets today and tomorrow then resume 1 tablet every day.   Current Anticoagulation Instructions: INR 1.7  Start NEW dosing schedule of 1.5 tablets on Thursday and Tuesday and 1 tablet all other days.  Return to clinic in 1 week.

## 2010-04-07 NOTE — Medication Information (Signed)
Summary: rov/sp  Anticoagulant Therapy  Managed by: Weston Brass, PharmD Referring MD: Daleen Squibb PCP: Dewain Penning Supervising MD: Clifton James MD,Christopher Indication 1: Atrial Fibrillation Lab Used: [facility] [place] Site: [address] INR POC 1.9 INR RANGE 2.0-3.0  Dietary changes: no    Health status changes: no    Bleeding/hemorrhagic complications: no    Recent/future hospitalizations: no    Any changes in medication regimen? no    Recent/future dental: no  Any missed doses?: no       Is patient compliant with meds? yes       Allergies: 1)  ! Asa 2)  ! Codeine 3)  ! Sulfa  Anticoagulation Management History:      The patient is taking warfarin and comes in today for a routine follow up visit.  Positive risk factors for bleeding include an age of 75 years or older.  The bleeding index is 'intermediate risk'.  Positive CHADS2 values include History of HTN and Age > 81 years old.  Anticoagulation responsible provider: Clifton James MD,Christopher.  INR POC: 1.9.  Cuvette Lot#: [id number].  Exp: 12/2010.    Anticoagulation Management Assessment/Plan:      The patient's current anticoagulation dose is Warfarin sodium 2.5 mg tabs: 1 once daily  AS DIRECTED.  The target INR is 2.0-3.0.  The next INR is due 11/24/2009.  Anticoagulation instructions were given to patient.  Results were reviewed/authorized by Weston Brass, PharmD.  She was notified by Lyna Poser PharmD.         Prior Anticoagulation Instructions: INR 2.3  Continue 2 tablets daily except 1.5 tablets on Tue and Sat.  Return to clinic in 2 weeks.  Current Anticoagulation Instructions: INR 1.9 Take 2 tablets on saturday instead of 1.5. Continue the same dose all other days. 2 tablets on sunday, monday, wednesday, thursday, friday and saturday. Take 1.5 tablets on tuesday. see you in 1 week.

## 2010-04-07 NOTE — Progress Notes (Signed)
Summary: not feeling well  Phone Note Call from Patient Call back at [phone]   Caller: Daughter/Deloris Reason for Call: Talk to Nurse Summary of Call: every since the dr up her meds she has not been feeling well....very tired, dizzy, headache and nausea, is not going to take her meds until she hears from [place] Initial call taken by: Migdalia Dk,  Aug 01, 2009 10:08 AM  Follow-up for Phone Call        PT daughter call back regarding her mother not feeling well  Deloris call back [phone] Judie Grieve  Aug 01, 2009 1:12 PM   Additional Follow-up for Phone Call Additional follow up Details #1::        SPOKE WITH DR Lindzee Gouge RE PT PER DR TAKE METOPROLOL 50 MG 1/2 TAB two times a day  AND TO PURCHASE  A B/P MACHINE IF CAN AFFORD AND KEEP A B/P LOG INSTRUCTED TO CALL BACK BEGINNING OF NEXT WEEK WITH UPDATE.VERBALIZED UNDERSTANDING. Additional Follow-up by: Scherrie Bateman, LPN,  Aug 01, 2009 2:42 PM

## 2010-04-07 NOTE — Medication Information (Signed)
Summary: rov/jaj  Anticoagulant Therapy  Managed by: Reina Fuse, PharmD Referring MD: Daleen Squibb PCP: Dewain Penning Supervising MD: Antoine Poche MD, Fayrene Fearing Indication 1: Atrial Fibrillation Lab Used: [facility] [place] Site: [address] INR POC 2.7 INR RANGE 2.0-3.0  Dietary changes: no    Health status changes: no    Bleeding/hemorrhagic complications: no    Recent/future hospitalizations: no    Any changes in medication regimen? no    Recent/future dental: no  Any missed doses?: no       Is patient compliant with meds? yes       Current Medications (verified): 1)  Calcium-Vitamin D 600-200 Mg-Unit Tabs (Calcium-Vitamin D) .Marland Kitchen.. 1 Tab Three Times A Day 2)  Centrum  Tabs (Multiple Vitamins-Minerals) .Marland Kitchen.. 1 Tab Once Daily 3)  Fish Oil 1000 Mg Caps (Omega-3 Fatty Acids) .Marland Kitchen.. 1 Cap Two Times A Day 4)  Garlic Oil 1000 Mg Caps (Garlic) .Marland Kitchen.. 1 Cap Once Daily 5)  Lorazepam 0.5 Mg Tabs (Lorazepam) .... 1/2-1 Tab At Bedtime As Needed 6)  Red Yeast Rice 600 Mg Caps (Red Yeast Rice Extract) .Marland Kitchen.. 1 Cap Once Daily 7)  Synthroid 75 Mcg Tabs (Levothyroxine Sodium) .Marland Kitchen.. 1 Tab Once Daily 8)  Turmeric Curcumin  Caps (Misc Natural Products) .Marland Kitchen.. 1 Cap Once Daily 9)  Vitamin D 400 Unit Caps (Cholecalciferol) .Marland Kitchen.. 1 Cap Once Daily 10)  Vitamin E 400 Unit Caps (Vitamin E) .Marland Kitchen.. 1 Cap Once Daily 11)  Folic Acid 400 Mcg Tabs (Folic Acid) .Marland Kitchen.. 1 Tab Once Daily 12)  Cinnamon 500 Mg Caps (Cinnamon) .Marland Kitchen.. 1 Cap Once Daily 13)  Metoprolol Succinate 50 Mg Xr24h-Tab (Metoprolol Succinate) .Marland Kitchen.. 1 Once Daily 14)  Warfarin Sodium 2.5 Mg Tabs (Warfarin Sodium) .Marland Kitchen.. 1 Once Daily  As Directed  Allergies (verified): 1)  ! Asa 2)  ! Codeine 3)  ! Sulfa  Anticoagulation Management History:      Positive risk factors for bleeding include an age of 45 years or older.  The bleeding index is 'intermediate risk'.  Positive CHADS2 values include History of HTN and Age > 36 years old.  Anticoagulation  responsible provider: Antoine Poche MD, Fayrene Fearing.  INR POC: 2.7.  Exp: 12/2010.    Anticoagulation Management Assessment/Plan:      The patient's current anticoagulation dose is Warfarin sodium 2.5 mg tabs: 1 once daily  AS DIRECTED.  The target INR is 2.0-3.0.  The next INR is due 01/12/2010.  Anticoagulation instructions were given to patient.  Results were reviewed/authorized by Reina Fuse, PharmD.  She was notified by Reina Fuse, PharmD.         Prior Anticoagulation Instructions: INR 2.6  Continue taking 2 tablets everyday except take 1 1/2 tablets on Tuesdays. Re-check INR in 3 weeks.   Current Anticoagulation Instructions: INR 2.7  Continue taking Coumadin 2 tabs (5 mg) all days except Coumadin 1.5 tabs (3.75 mg).  Return to clinic in 4 weeks.

## 2010-04-07 NOTE — Medication Information (Signed)
Summary: rov  Anticoagulant Therapy  Managed by: Weston Brass, PharmD Referring MD: Daleen Squibb PCP: Dewain Penning Supervising MD: Tenny Craw MD, Gunnar Fusi Indication 1: Atrial Fibrillation Lab Used: [facility] [place] Site: [address] INR POC 3.2 INR RANGE 2.0-3.0  Dietary changes: no    Health status changes: no    Bleeding/hemorrhagic complications: no    Recent/future hospitalizations: no    Any changes in medication regimen? no    Recent/future dental: no  Any missed doses?: no       Is patient compliant with meds? yes       Allergies: 1)  ! Asa 2)  ! Codeine 3)  ! Sulfa  Anticoagulation Management History:      The patient is taking warfarin and comes in today for a routine follow up visit.  Positive risk factors for bleeding include an age of 75 years or older.  The bleeding index is 'intermediate risk'.  Positive CHADS2 values include History of HTN and Age > 75 years old.  Her last INR was 2.8.  Anticoagulation responsible provider: Tenny Craw MD, Gunnar Fusi.  INR POC: 3.2.  Cuvette Lot#: [id number].  Exp: 02/2011.    Anticoagulation Management Assessment/Plan:      The patient's current anticoagulation dose is Warfarin sodium 2.5 mg tabs: 1 once daily  AS DIRECTED.  The target INR is 2.0-3.0.  The next INR is due 03/05/2010.  Anticoagulation instructions were given to patient.  Results were reviewed/authorized by Weston Brass, PharmD.  She was notified by Weston Brass PharmD.         Prior Anticoagulation Instructions: INR 2.8  Continue current regimen of 2 tablets daily except 1.5 tablets on Tuesday.  Current Anticoagulation Instructions: INR 3.2  Take 1 tablet today then resume same dose of 2 tablets every day excpet 1 1/2 tablets on Tuesday.  Recheck INR in 3 weeks.

## 2010-04-07 NOTE — Medication Information (Signed)
Summary: rov/tm  Anticoagulant Therapy  Managed by: Weston Brass, PharmD Referring MD: Daleen Squibb PCP: Dewain Penning Supervising MD: Clifton James MD, Cristal Deer Indication 1: Atrial Fibrillation Lab Used: [facility] [place] Site: [address] INR POC 1.8 INR RANGE 2.0-3.0  Dietary changes: no    Health status changes: no    Bleeding/hemorrhagic complications: no    Recent/future hospitalizations: no    Any changes in medication regimen? yes       Details: Began taking OTC probiotic  Recent/future dental: no  Any missed doses?: no       Is patient compliant with meds? yes       Allergies: 1)  ! Asa 2)  ! Codeine 3)  ! Sulfa  Anticoagulation Management History:      The patient is taking warfarin and comes in today for a routine follow up visit.  Positive risk factors for bleeding include an age of 16 years or older.  The bleeding index is 'intermediate risk'.  Positive CHADS2 values include History of HTN and Age > 34 years old.  Anticoagulation responsible provider: Clifton James MD, Cristal Deer.  INR POC: 1.8.  Exp: 12/2010.    Anticoagulation Management Assessment/Plan:      The patient's current anticoagulation dose is Warfarin sodium 2.5 mg tabs: 1 once daily  AS DIRECTED.  The target INR is 2.0-3.0.  The next INR is due 10/27/2009.  Anticoagulation instructions were given to patient.  Results were reviewed/authorized by Weston Brass, PharmD.  She was notified by Liana Gerold, PharmD Candidate.         Prior Anticoagulation Instructions: INR 1.7 Change dose to 1.5 pills everyday except 2 pills on Mondays, Wednesdays and Fridays. Recheck in 2 weeks.   Current Anticoagulation Instructions: INR 1.8  Take 2 1/2 tablets today then increase dose to 2 tablets every day except 1 1/2 tablets on Tuesday and Saturday.

## 2010-04-07 NOTE — Medication Information (Signed)
Summary: rov/mw  Anticoagulant Therapy  Managed by: Weston Brass, PharmD Referring MD: Daleen Squibb PCP: Dewain Penning Supervising MD: Excell Seltzer MD, Casimiro Needle Indication 1: Atrial Fibrillation Lab Used: [facility] [place] Site: [address] INR POC 2.6 INR RANGE 2.0-3.0  Dietary changes: no    Health status changes: no    Bleeding/hemorrhagic complications: no    Recent/future hospitalizations: no    Any changes in medication regimen? no    Recent/future dental: no  Any missed doses?: no       Is patient compliant with meds? yes       Allergies: 1)  ! Asa 2)  ! Codeine 3)  ! Sulfa  Anticoagulation Management History:      The patient is taking warfarin and comes in today for a routine follow up visit.  Positive risk factors for bleeding include an age of 75 years or older.  The bleeding index is 'intermediate risk'.  Positive CHADS2 values include History of HTN and Age > 69 years old.  Anticoagulation responsible provider: Excell Seltzer MD, Casimiro Needle.  INR POC: 2.6.  Cuvette Lot#: [id number].  Exp: 12/2010.    Anticoagulation Management Assessment/Plan:      The patient's current anticoagulation dose is Warfarin sodium 2.5 mg tabs: 1 once daily  AS DIRECTED.  The target INR is 2.0-3.0.  The next INR is due 12/15/2009.  Anticoagulation instructions were given to patient.  Results were reviewed/authorized by Weston Brass, PharmD.  She was notified by Harrel Carina, PharmD candidate.         Prior Anticoagulation Instructions: INR 1.9 Take 2 tablets on saturday instead of 1.5. Continue the same dose all other days. 2 tablets on sunday, monday, wednesday, thursday, friday and saturday. Take 1.5 tablets on tuesday. see you in 1 week.  Current Anticoagulation Instructions: INR 2.6  Continue taking 2 tablets everyday except take 1 1/2 tablets on Tuesdays. Re-check INR in 3 weeks.

## 2010-04-07 NOTE — Medication Information (Signed)
Summary: started 2.5mg / 5/16/afib/saf  Anticoagulant Therapy  Managed by: Weston Brass, PharmD Referring MD: Daleen Squibb PCP: Dewain Penning Supervising MD: Tenny Craw MD, Gunnar Fusi Indication 1: Atrial Fibrillation Lab Used: [facility] [place] Site: [address] INR POC 1.3 INR RANGE 2.0-3.0  Dietary changes: no    Health status changes: yes       Details: Started on Coumadin by Dr. Daleen Squibb 4 days ago in the office.  Was started on 2.5mg  daily   Bleeding/hemorrhagic complications: no    Recent/future hospitalizations: no    Any changes in medication regimen? no    Recent/future dental: no  Any missed doses?: no       Is patient compliant with meds? yes      Comments: Pt educated on risks of bleeding, medication interactions, and dietary concerns  Allergies: 1)  ! Asa 2)  ! Codeine 3)  ! Sulfa  Anticoagulation Management History:      The patient comes in today for her initial visit for anticoagulation therapy.  Positive risk factors for bleeding include an age of 75 years or older.  The bleeding index is 'intermediate risk'.  Positive CHADS2 values include History of HTN and Age > 43 years old.  Anticoagulation responsible provider: Tenny Craw MD, Gunnar Fusi.  INR POC: 1.3.  Cuvette Lot#: [id number].  Exp: 10/2010.    Anticoagulation Management Assessment/Plan:      The patient's current anticoagulation dose is Warfarin sodium 2.5 mg tabs: 1 once daily  AS DIRECTED.  The target INR is 2.0-3.0.  The next INR is due 07/31/2009.  Anticoagulation instructions were given to patient.  Results were reviewed/authorized by Weston Brass, PharmD.  She was notified by Weston Brass PharmD.         Current Anticoagulation Instructions: INR 1.3  Take 1 1/2 tablets today and tomorrow then resume 1 tablet every day.

## 2010-04-07 NOTE — Assessment & Plan Note (Signed)
Summary: per check out/sf   Visit Type:  rov Primary Provider:  Dewain Penning  CC:  no cardiac complaints today.  History of Present Illness: Mrs. Halls returns today for followup of her new onset atrial fibrillation.  She feels better with less heart fluttering since starting the low-dose metoprolol.  She did not start the aspirin waiting to hear from the echocardiogram.  Echo showed anterior lateral hypokinesia with normal Nevelyn Mellott thickness and normal cavity size. Her ejection fraction is around 40%. His mom much regurgitation and moderate left H1 enlargement mild right enlargement. She has mild-to-moderate tricuspid regurgitation with a peak pulmonary pressure 42 mm of mercury.  There is no history of a coronary event but she has had a history of hypertension. She is totally asymptomatic.  Current Medications (verified): 1)  Calcium-Vitamin D 600-200 Mg-Unit Tabs (Calcium-Vitamin D) .Marland Kitchen.. 1 Tab Three Times A Day 2)  Centrum  Tabs (Multiple Vitamins-Minerals) .Marland Kitchen.. 1 Tab Once Daily 3)  Fish Oil 1000 Mg Caps (Omega-3 Fatty Acids) .Marland Kitchen.. 1 Cap Two Times A Day 4)  Garlic Oil 1000 Mg Caps (Garlic) .Marland Kitchen.. 1 Cap Once Daily 5)  Lorazepam 0.5 Mg Tabs (Lorazepam) .... 1/2-1 Tab At Bedtime As Needed 6)  Red Yeast Rice 600 Mg Caps (Red Yeast Rice Extract) .Marland Kitchen.. 1 Cap Once Daily 7)  Synthroid 75 Mcg Tabs (Levothyroxine Sodium) .Marland Kitchen.. 1 Tab Once Daily 8)  Turmeric Curcumin  Caps (Misc Natural Products) .Marland Kitchen.. 1 Cap Once Daily 9)  Vitamin D 400 Unit Caps (Cholecalciferol) .Marland Kitchen.. 1 Cap Once Daily 10)  Vitamin E 400 Unit Caps (Vitamin E) .Marland Kitchen.. 1 Cap Once Daily 11)  Folic Acid 400 Mcg Tabs (Folic Acid) .Marland Kitchen.. 1 Tab Once Daily 12)  Cinnamon 500 Mg Caps (Cinnamon) .Marland Kitchen.. 1 Cap Once Daily 13)  Metoprolol Succinate 25 Mg Xr24h-Tab (Metoprolol Succinate) .Marland Kitchen.. 1 Once Daily  Allergies: 1)  ! Asa 2)  ! Codeine 3)  ! Sulfa  Past History:  Past Medical History: Last updated: 07/11/2009 ATRIAL FIBRILLATION  (ICD-427.31) CHEST PAIN (ICD-786.50) HYPERLIPIDEMIA (ICD-272.4) GERD (ICD-530.81) BACK PAIN (ICD-724.5) OSTEOARTHRITIS, KNEE (ICD-715.96) HYPOTHYROIDISM (ICD-244.9) OSTEOPOROSIS (ICD-733.00)    Past Surgical History: Last updated: 07/03/2009 Thyroid Arthritis Ulcer surgery  Family History: Last updated: 07/03/2009 Family History of Cancer: Kidney Family History of Coronary Artery Disease:  Fx h/o arthritis  Social History: Last updated: 07/03/2009 Tobacco Use - No.  Alcohol Use - no Drug Use - no  Risk Factors: Smoking Status: never (07/03/2009)  Review of Systems       negative other than history of present illness  Vital Signs:  Patient profile:   77 year old female Height:      63 inches Weight:      127 pounds BMI:     22.58 Pulse rate:   92 / minute Pulse rhythm:   regular BP sitting:   148 / 100  (left arm) Cuff size:   large  Vitals Entered By: Danielle Rankin, CMA (Jul 21, 2009 1:42 PM)  Physical Exam  General:  elderly, alert oriented x3. Good balance and ambulation. Head:  normocephalic and atraumatic Eyes:  PERRLA/EOM intact; conjunctiva and lids normal. Neck:  Neck supple, no JVD. No masses, thyromegaly or abnormal cervical nodes. Chest Welda Azzarello:  no deformities or breast masses noted Lungs:  Clear bilaterally to auscultation and percussion. Heart:  irregular rate and rhythm, soft systolic murmur at the apex, bearable S1-S2 Msk:  decreased ROM.  decreased ROM.   Pulses:  pulses normal in all 4  extremities Extremities:  No clubbing or cyanosis. Neurologic:  Alert and oriented x 3. Skin:  Intact without lesions or rashes. Psych:  Normal affect.   Problems:  Medical Problems Added: 1)  Dx of Hypertension, Unspecified  (ICD-401.9) 2)  Dx of Encounter For Long-term Use of Other Medications  (ICD-V58.69)  Impression & Recommendations:  Problem # 1:  ATRIAL FIBRILLATION (ICD-427.31) She feels remarkably better on low-dose metoprolol. With her  hypertension, and her rate is still around 80, probably increase her metoprolol to 50 mg per day. In addition, with her left ventricular dysfunction, probable vascular disease including coronary disease, age, and hypertension I have recommended Coumadin. After long discussion she has agreed to proceed. Risks potential benefits and discussed with her daughter. Her brother had a history of what sounds like blue toe syndrome. My daughters a pharmacology said there is no association her risk with her. We'll watch her closely and have her come back in 4 days for protime. I will see her back in 4-6 weeks. Her updated medication list for this problem includes:    Metoprolol Succinate 50 Mg Xr24h-tab (Metoprolol succinate) .Marland Kitchen... 1 once daily    Warfarin Sodium 2.5 Mg Tabs (Warfarin sodium) .Marland Kitchen... 1 once daily  as directed  Her updated medication list for this problem includes:    Metoprolol Succinate 25 Mg Xr24h-tab (Metoprolol succinate) .Marland Kitchen... 1 once daily  Orders: [facility] Referral (Coumadin clinic)  Problem # 2:  ENCOUNTER FOR LONG-TERM USE OF OTHER MEDICATIONS (ICD-V58.69) Assessment: New  Orders: [facility] Referral (Coumadin clinic)  Problem # 3:  HYPERTENSION, UNSPECIFIED (ICD-401.9)  Her updated medication list for this problem includes:    Metoprolol Succinate 50 Mg Xr24h-tab (Metoprolol succinate) .Marland Kitchen... 1 once daily  Her updated medication list for this problem includes:    Metoprolol Succinate 50 Mg Xr24h-tab (Metoprolol succinate) .Marland Kitchen... 1 once daily  Patient Instructions: 1)  Your physician recommends that you schedule a follow-up appointment in: 6-8 WEEKS WITH DR Eather Chaires 2)  Your physician has recommended you make the following change in your medication: INCREASE METOPROLOL 50 MG 1 once daily 3)  COUMADIN 2.5 MG once daily 4)  You have been referred toCOUMADIN CLINIC  NEEDS APPT ON FRI. Prescriptions: METOPROLOL SUCCINATE 50 MG XR24H-TAB (METOPROLOL  SUCCINATE) 1 once daily  #30 x 11   Entered by:   Scherrie Bateman, LPN   Authorized by:   Gaylord Shih, MD, [facility]   Signed by:   Scherrie Bateman, LPN on 52/84/1324   Method used:   Electronically to        [facility]  [phone]* (retail)       [address]       [place], [place]  [address]       Ph: [phone] or [phone]       Fax: [phone]   RxID:   [id number] WARFARIN SODIUM 2.5 MG TABS (WARFARIN SODIUM) 1 once daily  AS DIRECTED  #30 x 3   Entered by:   Scherrie Bateman, LPN   Authorized by:   Gaylord Shih, MD, [facility]   Signed by:   Scherrie Bateman, LPN on 57/32/2025   Method used:   Electronically to        [facility]  [phone]* (retail)       [address]       [place], [place]  [address]       Ph: [phone] or [phone]       Fax: [phone]   RxID:   [id number]

## 2010-04-09 NOTE — Medication Information (Signed)
Summary: rov/tm  Anticoagulant Therapy  Managed by: Windell Hummingbird, RN Referring MD: Daleen Squibb PCP: Dewain Penning Supervising MD: Myrtis Ser MD, Tinnie Gens Indication 1: Atrial Fibrillation Lab Used: [facility] [place] Site: [address] INR POC 2.5 INR RANGE 2.0-3.0  Dietary changes: no    Health status changes: no    Bleeding/hemorrhagic complications: no    Recent/future hospitalizations: no    Any changes in medication regimen? no    Recent/future dental: no  Any missed doses?: no       Is patient compliant with meds? yes       Allergies: 1)  ! Asa 2)  ! Codeine 3)  ! Sulfa  Anticoagulation Management History:      The patient is taking warfarin and comes in today for a routine follow up visit.  Positive risk factors for bleeding include an age of 24 years or older.  The bleeding index is 'intermediate risk'.  Positive CHADS2 values include History of HTN and Age > 58 years old.  Her last INR was 2.8.  Anticoagulation responsible provider: Myrtis Ser MD, Tinnie Gens.  INR POC: 2.5.  Cuvette Lot#: [id number].  Exp: 03/2011.    Anticoagulation Management Assessment/Plan:      The patient's current anticoagulation dose is Warfarin sodium 2.5 mg tabs: Take as directed by coumadin clinic..  The target INR is 2.0-3.0.  The next INR is due 04/30/2010.  Anticoagulation instructions were given to patient.  Results were reviewed/authorized by Windell Hummingbird, RN.  She was notified by Windell Hummingbird, RN.         Prior Anticoagulation Instructions: INR 2.6 Continue 2 pills everyday except 1.5 pills on Tuesdays. Recheck in 4 weeks.   Current Anticoagulation Instructions: INR 2.5 Continue taking 2 tablets every day except Tuesdays take 1.5 tablets. Recheck in 4 weeks.

## 2010-04-09 NOTE — Medication Information (Signed)
Summary: rov/sp  Anticoagulant Therapy  Managed by: Bethena Midget, RN, BSN Referring MD: Daleen Squibb PCP: Dewain Penning Supervising MD: Jens Som MD, Arlys John Indication 1: Atrial Fibrillation Lab Used: [facility] [place] Site: [address] INR POC 2.6 INR RANGE 2.0-3.0  Dietary changes: no    Health status changes: no    Bleeding/hemorrhagic complications: no    Recent/future hospitalizations: no    Any changes in medication regimen? no    Recent/future dental: no  Any missed doses?: no       Is patient compliant with meds? yes       Allergies: 1)  ! Asa 2)  ! Codeine 3)  ! Sulfa  Anticoagulation Management History:      The patient is taking warfarin and comes in today for a routine follow up visit.  Positive risk factors for bleeding include an age of 75 years or older.  The bleeding index is 'intermediate risk'.  Positive CHADS2 values include History of HTN and Age > 75 years old.  Her last INR was 2.8.  Anticoagulation responsible provider: Jens Som MD, Arlys John.  INR POC: 2.6.  Cuvette Lot#: [id number].  Exp: 04/2011.    Anticoagulation Management Assessment/Plan:      The patient's current anticoagulation dose is Warfarin sodium 2.5 mg tabs: Take as directed by coumadin clinic..  The target INR is 2.0-3.0.  The next INR is due 04/02/2010.  Anticoagulation instructions were given to patient.  Results were reviewed/authorized by Bethena Midget, RN, BSN.  She was notified by Bethena Midget, RN, BSN.         Prior Anticoagulation Instructions: INR 3.2  Take 1 tablet today then resume same dose of 2 tablets every day excpet 1 1/2 tablets on Tuesday.  Recheck INR in 3 weeks.   Current Anticoagulation Instructions: INR 2.6 Continue 2 pills everyday except 1.5 pills on Tuesdays. Recheck in 4 weeks.

## 2010-04-21 ENCOUNTER — Encounter: Payer: Self-pay | Admitting: Physician Assistant

## 2010-04-23 ENCOUNTER — Encounter (INDEPENDENT_AMBULATORY_CARE_PROVIDER_SITE_OTHER): Payer: Medicare Other | Admitting: Physician Assistant

## 2010-04-23 ENCOUNTER — Encounter: Payer: Self-pay | Admitting: Physician Assistant

## 2010-04-23 DIAGNOSIS — Z01810 Encounter for preprocedural cardiovascular examination: Secondary | ICD-10-CM

## 2010-04-23 DIAGNOSIS — R079 Chest pain, unspecified: Secondary | ICD-10-CM

## 2010-04-27 ENCOUNTER — Telehealth (INDEPENDENT_AMBULATORY_CARE_PROVIDER_SITE_OTHER): Payer: Self-pay

## 2010-04-29 NOTE — Assessment & Plan Note (Signed)
Summary: surgical clearance for  hernia repair. gd   Visit Type:  Pre-op Evaluation Primary Provider:  Dewain Penning   History of Present Illness: Primary Cardiologist:  Dr. Valera Castle  Amanda [name] is an 77 yo female with a h/o atrial fibrillation on chronic Coumadin therapy, hyperlipidemia, hypothyroidism and GERD.  Prior echocardiogram demonstrates an ejection fraction of 40% with mild mitral regurgitation, moderate LAE, mild RAE, PASP 42.    The patient has a right inguinal hernia and she is contemplating repair.  Her surgeon is Dr. Carolynne Edouard.  She will occasionally have some chest tightness.  She is unable to really quantify or qualify this.  However, she does note that she is very active.  She cleans her house which includes vacuuming and she does outdoor activities such as raking leaves without significant chest pain or shortness of breath.  She denies syncope.  She denies orthopnea, PND or edema.  Of note, her hernia repair will have to be done by an open approach.  Current Medications (verified): 1)  Calcium-Vitamin D 600-200 Mg-Unit Tabs (Calcium-Vitamin D) .Marland Kitchen.. 1 Tab Three Times A Day 2)  Centrum  Tabs (Multiple Vitamins-Minerals) .Marland Kitchen.. 1 Tab Once Daily 3)  Fish Oil 1000 Mg Caps (Omega-3 Fatty Acids) .Marland Kitchen.. 1 Cap Two Times A Day 4)  Garlic Oil 1000 Mg Caps (Garlic) .Marland Kitchen.. 1 Cap Once Daily 5)  Lorazepam 0.5 Mg Tabs (Lorazepam) .... 1/2-1 Tab At Bedtime As Needed 6)  Red Yeast Rice 600 Mg Caps (Red Yeast Rice Extract) .Marland Kitchen.. 1 Cap Once Daily 7)  Synthroid 75 Mcg Tabs (Levothyroxine Sodium) .Marland Kitchen.. 1 Tab Once Daily 8)  Turmeric Curcumin  Caps (Misc Natural Products) .Marland Kitchen.. 1 Cap Once Daily 9)  Vitamin D 400 Unit Caps (Cholecalciferol) .Marland Kitchen.. 1 Cap Once Daily 10)  Vitamin E 400 Unit Caps (Vitamin E) .Marland Kitchen.. 1 Cap Once Daily 11)  Folic Acid 400 Mcg Tabs (Folic Acid) .Marland Kitchen.. 1 Tab Once Daily 12)  Metoprolol Succinate 50 Mg Xr24h-Tab (Metoprolol Succinate) .Marland Kitchen.. 1 Once Daily 13)  Warfarin Sodium 2.5 Mg Tabs  (Warfarin Sodium) .... Take As Directed By Coumadin Clinic.  Allergies (verified): 1)  ! Asa 2)  ! Codeine 3)  ! Sulfa  Past History:  Past Medical History: ATRIAL FIBRILLATION (ICD-427.31) CHEST PAIN (ICD-786.50) HYPERLIPIDEMIA (ICD-272.4) GERD (ICD-530.81) BACK PAIN (ICD-724.5) OSTEOARTHRITIS, KNEE (ICD-715.96) HYPOTHYROIDISM (ICD-244.9) OSTEOPOROSIS (ICD-733.00) Peptic ulcer disease with a prior history of upper GI bleeding  Review of Systems       As per  the HPI.  All other systems reviewed and negative.   Vital Signs:  Patient profile:   77 year old female Height:      63 inches Weight:      129 pounds Pulse rate:   81 / minute BP sitting:   154 / 89  (left arm) Cuff size:   large  Vitals Entered By: Burnett Kanaris, CNA (April 23, 2010 3:17 PM)  Physical Exam  General:  Well nourished, well developed, in no acute distress HEENT: normal Neck: no JVD Cardiac:  normal S1, S2; irregularly irregular; no murmur Lungs:  clear to auscultation bilaterally, no wheezing, rhonchi or rales Abd: soft, nontender, no hepatomegaly Ext: trace-1+ ankle edema Vascular: no carotid  bruits Skin: warm and dry Neuro:  CNs 2-12 intact, no focal abnormalities noted Endo: No thyromegaly   EKG  Procedure date:  04/23/2010  Findings:      atrial fibrillation Heart rate 81 Leftward axis LVH Nonspecific ST-T wave changes  Impression &  Recommendations:  Problem # 1:  CHEST PAIN (ICD-786.50) She has occasional chest discomfort.  She describes this as a tightness.  It is somewhat atypical.  Her functional status appears to be good.  However, with a prior history of reduced LV function and her contemplating surgery (that sounds as though it will be a mild to moderate risk surgery with an open approach) I will arrange for her to have a Lexiscan Myoview.  She will be brought back in followup with Dr. Daleen Squibb to review the results.  Orders: EKG w/ Interpretation (93000) Nuclear  Stress Test (Nuc Stress Test)  Problem # 2:  PRE-OPERATIVE CARDIOVASCULAR EXAMINATION (ICD-V72.81) As above, we will proceed with a Myoview study prior to clearing her.  Problem # 3:  ATRIAL FIBRILLATION (ICD-427.31) Rate control.  She will continue on Coumadin.  Orders: EKG w/ Interpretation (93000)  Problem # 4:  HYPERTENSION, UNSPECIFIED (ICD-401.9) She has an elevated blood pressure today.  She describes what sounds like whitecoat hypertension.  I've asked her to monitor blood pressures at home.  We could certainly go up on her Toprol if her pressures at home are consistent with what we obtained today.  Patient Instructions: 1)  Your physician recommends that you schedule a follow-up appointment in: 2 wseeks with Dr. Daleen Squibb after pt haas had [facility]. 2)  Your physician has requested that you have an [employer]  For further information please visit [email or web address]  Please follow instruction sheet, as given. 3)  Your physician recommends that you continue on your current medications as directed. Please refer to the Current Medication list given to you today.

## 2010-04-30 ENCOUNTER — Encounter (INDEPENDENT_AMBULATORY_CARE_PROVIDER_SITE_OTHER): Payer: Medicare Other

## 2010-04-30 ENCOUNTER — Encounter: Payer: Self-pay | Admitting: Internal Medicine

## 2010-04-30 DIAGNOSIS — I4891 Unspecified atrial fibrillation: Secondary | ICD-10-CM

## 2010-04-30 DIAGNOSIS — Z7901 Long term (current) use of anticoagulants: Secondary | ICD-10-CM

## 2010-04-30 LAB — CONVERTED CEMR LAB: POC INR: 2.6

## 2010-05-04 ENCOUNTER — Other Ambulatory Visit (HOSPITAL_COMMUNITY): Payer: Medicare Other

## 2010-05-05 NOTE — Medication Information (Signed)
Summary: rov/sp/rs per pt son=mj  Anticoagulant Therapy  Managed by: Cloyde Reams, RN, BSN Referring MD: Daleen Squibb PCP: Dewain Penning Supervising MD: Ladona Ridgel MD, Sharlot Gowda Indication 1: Atrial Fibrillation Lab Used: [facility] [place] Site: [address] INR POC 2.6 INR RANGE 2.0-3.0  Dietary changes: no    Health status changes: no    Bleeding/hemorrhagic complications: no    Recent/future hospitalizations: no    Any changes in medication regimen? no    Recent/future dental: no  Any missed doses?: no       Is patient compliant with meds? yes       Allergies: 1)  ! Asa 2)  ! Codeine 3)  ! Sulfa  Anticoagulation Management History:      The patient is taking warfarin and comes in today for a routine follow up visit.  Positive risk factors for bleeding include an age of 8 years or older.  The bleeding index is 'intermediate risk'.  Positive CHADS2 values include History of HTN and Age > 68 years old.  Her last INR was 2.8.  Anticoagulation responsible provider: Ladona Ridgel MD, Sharlot Gowda.  INR POC: 2.6.  Cuvette Lot#: [id number].  Exp: 04/2011.    Anticoagulation Management Assessment/Plan:      The patient's current anticoagulation dose is Warfarin sodium 2.5 mg tabs: Take as directed by coumadin clinic..  The target INR is 2.0-3.0.  The next INR is due 05/28/2010.  Anticoagulation instructions were given to patient.  Results were reviewed/authorized by Cloyde Reams, RN, BSN.  She was notified by Cloyde Reams RN.         Prior Anticoagulation Instructions: INR 2.5 Continue taking 2 tablets every day except Tuesdays take 1.5 tablets. Recheck in 4 weeks.  Current Anticoagulation Instructions: INR 2.6  Continue on same dosage 2 tablets daily except 1.5 tablets on Tuesdays.  Recheck in 4 weeks.

## 2010-05-05 NOTE — Progress Notes (Signed)
Summary: Cancellation of myoview  Phone Note Other Incoming   Caller: Tommie (son) Summary of Call: The patient's son called to cancel his mom's myoview test on 05/04/10 due to patient doesn't want the test. Dr. Vern Claude nurse notified of cancellation. Nanea Jared,RN.

## 2010-05-11 ENCOUNTER — Ambulatory Visit: Payer: Medicare Other | Admitting: Cardiology

## 2010-05-25 LAB — COMPREHENSIVE METABOLIC PANEL
ALT: 14 U/L (ref 0–35)
AST: 31 U/L (ref 0–37)
CO2: 23 mEq/L (ref 19–32)
Calcium: 8.7 mg/dL (ref 8.4–10.5)
Chloride: 101 mEq/L (ref 96–112)
Creatinine, Ser: 1.22 mg/dL — ABNORMAL HIGH (ref 0.4–1.2)
GFR calc non Af Amer: 42 mL/min — ABNORMAL LOW (ref >60)

## 2010-05-25 LAB — URINE CULTURE

## 2010-05-25 LAB — URINALYSIS, ROUTINE W REFLEX MICROSCOPIC
Ketones, ur: 15 mg/dL — AB
Specific Gravity, Urine: 1.023 (ref 1.005–1.030)
pH: 6 (ref 5.0–8.0)

## 2010-05-25 LAB — CBC
MCHC: 33.3 g/dL (ref 30.0–36.0)
Platelets: 136 10*3/uL — ABNORMAL LOW (ref 150–400)
RBC: 4.43 MIL/uL (ref 3.87–5.11)
RDW: 13.9 % (ref 11.5–15.5)

## 2010-05-25 LAB — DIFFERENTIAL
Basophils Absolute: 0 10*3/uL (ref 0.0–0.1)
Eosinophils Relative: 0 % (ref 0–5)
Lymphocytes Relative: 5 % — ABNORMAL LOW (ref 12–46)
Monocytes Relative: 7 % (ref 3–12)
Neutro Abs: 13.5 10*3/uL — ABNORMAL HIGH (ref 1.7–7.7)

## 2010-05-25 LAB — TROPONIN I: Troponin I: 0.05 ng/mL (ref 0.00–0.06)

## 2010-05-25 LAB — URINE MICROSCOPIC-ADD ON

## 2010-05-25 LAB — CK TOTAL AND CKMB (NOT AT ARMC): CK, MB: 1.7 ng/mL (ref 0.3–4.0)

## 2010-05-25 LAB — PROTIME-INR: INR: 1.53 — ABNORMAL HIGH (ref 0.00–1.49)

## 2010-05-28 ENCOUNTER — Ambulatory Visit (INDEPENDENT_AMBULATORY_CARE_PROVIDER_SITE_OTHER): Payer: Medicare Other | Admitting: *Deleted

## 2010-05-28 DIAGNOSIS — Z7901 Long term (current) use of anticoagulants: Secondary | ICD-10-CM

## 2010-05-28 DIAGNOSIS — I4891 Unspecified atrial fibrillation: Secondary | ICD-10-CM

## 2010-05-28 NOTE — Patient Instructions (Signed)
INR 2.7 Continue current dose: 2 tablet (5mg ) every day except for Tuesday take 1.5tablet (3.75mg ) Recheck INR in 4 weeks

## 2010-06-17 ENCOUNTER — Other Ambulatory Visit: Payer: Self-pay | Admitting: Cardiology

## 2010-06-23 ENCOUNTER — Telehealth: Payer: Self-pay | Admitting: Cardiology

## 2010-06-23 NOTE — Telephone Encounter (Signed)
I spoke with daughter. Her mother saw an advertisement ? "On television ...stating stop metoprolol it causes death".  Reassured daughter we have not received any updated information regarding metoprolol.  She will talk with her mother and reassure her it is okay to take her medication. Mylo Red RN

## 2010-06-25 ENCOUNTER — Ambulatory Visit (INDEPENDENT_AMBULATORY_CARE_PROVIDER_SITE_OTHER): Payer: Medicare Other | Admitting: *Deleted

## 2010-06-25 ENCOUNTER — Encounter: Payer: Medicare Other | Admitting: *Deleted

## 2010-06-25 DIAGNOSIS — I4891 Unspecified atrial fibrillation: Secondary | ICD-10-CM

## 2010-06-25 LAB — POCT INR: INR: 2.6

## 2010-07-20 ENCOUNTER — Other Ambulatory Visit: Payer: Self-pay | Admitting: Cardiology

## 2010-07-23 ENCOUNTER — Ambulatory Visit (INDEPENDENT_AMBULATORY_CARE_PROVIDER_SITE_OTHER): Payer: Medicare Other | Admitting: *Deleted

## 2010-07-23 DIAGNOSIS — I4891 Unspecified atrial fibrillation: Secondary | ICD-10-CM

## 2010-07-23 LAB — POCT INR: INR: 2.9

## 2010-07-24 NOTE — H&P (Signed)
NAME:  Amanda [name], Amanda [name]                           ACCOUNT NO.:  [id number]   MEDICAL RECORD NO.:  [id number]                   PATIENT TYPE:  EMS   LOCATION:  ED                                   FACILITY:  [facility]   PHYSICIAN:  Rosanne Sack, M.D.         DATE OF BIRTH:  06/14/1922   DATE OF ADMISSION:  09/05/2002  DATE OF DISCHARGE:                                HISTORY & PHYSICAL   CHIEF COMPLAINT:  Weakness, shortness of breath, presyncope.   PROBLEM LIST:  1. GI bleed with reports of six black, tarry diarrhea testing occult blood     positive at [facility] today.     a. History of occult blood positive stool, November 2003, with GI consult        with Dr. Madilyn Fireman.     b. Upper endoscopy, January 30, 2002, noting lower esophageal ring        hiatal hernia, clover deformity suggestive of previous healed peptic        ulcer disease with family report CLOtest negative.     c. Status post colonoscopy January 30, 2002, noting diverticulosis.        Otherwise unremarkable.  2. New anemia, likely secondary to problem #1.  Baseline hemoglobin in 2000     was 14.4.     a. Current hemoglobin 10.9 with stool tested heme positive.     b. Rare p.r.n. use of iron replacement.     c. Presyncope while standing and complaints of shortness of breath        thought secondary to #1.  Supine blood pressure stable.  Systolic        blood pressure in the 120's.  3. Left lateral chest pain with shortness of breath on exertion, felt likely     secondary to problem #1.     a. No history of coronary artery disease listed.     b. Does have a previous history of hyperlipidemia with no listed statins        received currently.  4. Hypothyroidism on thyroid replacement.  5. Complaint of suprapubic pain, rule out urinary tract infection.  6. Mild prerenal azotemia.  Baseline BUN 15, and creatinine 1.0.  Currently     56 and 1.2 respectively.  7. History of anxiety disorder.  8.  History of osteoporosis.     a. Status post bone density scan in 2002.  Unsure of results.     b. 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  9. No previous surgical history listed.  10.      Allergies listed - CODEINE caused nausea.  MYCIN, ACTONEL, and     FOSAMAX caused lower esophageal pain.   HISTORY OF PRESENT ILLNESS:  This 77 year old female lives at home  independently with her husband.  She is followed in primary care by Dr.  Herb Grays at [facility].  She had one incident of  diarrhea yesterday and began feeling weak.  She had six progressive diarrhea  today with increased weakness, presyncope with standing, and shortness of  breath with exertion.  She went to [facility] office.  Hemoglobin was checked there, and this was in the low 9's.  Her stool was  checked for occult blood, and this was positive.  She has a history of  previous occult blood positive stool followed in GI consult by Dr. Madilyn Fireman.  He did both an upper endoscopy and colonoscopy with results as above.  Her  last known hemoglobin per the office information was 14.4 done in 2000.  The  patient reports a previous attempt to use both Actonel and Fosamax with GI  intolerance.  She reports she is currently on Evista and tolerating this.  She is also taking 81 mg aspirin routinely.  She was sent from the  [facility] office to the [facility] emergency room for  further evaluation and treatment.   FAMILY HISTORY:  Noncontributory in this 77 year old female.   SOCIAL HISTORY:  The patient is married and lives at home independently with  her husband.  She has two children, both present in the emergency room  today.  She has no known history of tobacco use.  She denies any previous  alcohol use.  No illicit drugs.   REVIEW OF SYSTEMS:  HEENT:  Complains of dizziness and lightheadedness with  standing.  This is relieved with seated.  HEART:  Denies palpitation.  She   did feel some left lateral chest pain yesterday.  She reported taking three  full strength aspirin at that time.  LUNGS:  No known history of COPD, and  no previous tobacco use.  Complained of dyspnea with exertion over the past  24 hours.  ABDOMEN:  Denies nausea or vomiting.  Denies abdominal pain.  Previous history of occult blood stool and GI consult with upper and lower  endoscopy as reported above.  She reports 6-7 diarrheal stools that were  black and of tarry consistency.  Stools checked at [facility] today were occult blood positive.  URINARY/GENITAL:  Does complain  of some suprapubic area pain.  Denies any change in frequency or hematuria.  RECTAL:  As above.  MUSCULOSKELETAL:  No complaints of arthralgias or  neuralgias.  Does complain of increased weakness.  NEUROLOGIC:  Denies any  focal changes.  Denies any change in speech, vision, hearing.  ENDOCRINE:  Does have a previous history of hypothyroidism.   MEDICATIONS AT THE TIME OF ADMISSION:  1. Synthroid 0.25 mg one-half tablet daily.  2. Multivitamin daily.  3. P.R.N. iron replacement.  4. Aspirin 81 mg daily.  5. Reports Evista 60 mg daily.   LABORATORY DATA:  Chest x-ray is pending.  CBC found WBC elevated at 14.2,  hemoglobin 10.9, hematocrit 31.4, platelets 218.  Absolute granulocytes  elevated at 10.5, and absolute monocytes elevated at 0.9.  Metabolic panel  revealed sodium of 139, potassium 4.0, chloride 107, CO2 24, BUN 56, and  creatinine 1.2.  A 12-lead EKG notes sinus tachycardia with nonspecific T  wave changes.  There are some depressed ST V2, V3, and V4.  Stool checked  occult blood positive at [facility], and this was not  repeated in the ER.  CK 54, MB 2.1, troponin 0.02.  UA found pyuria with 3-6  WBC's.   PHYSICAL EXAMINATION:  GENERAL:  Well-developed elderly female in no acute  distress.  She is  alert, cooperative, and oriented x4. VITAL SIGNS:  Temperature 97.3,  pulse 117, respirations 18-22, blood  pressure 122/78.  HEENT:  Head normocephalic.  Eyes - pupils equal, round and reactive to  light and accommodation.  Ears - canals clear, and hearing grossly normal.  Nose - Nares patent without masses or discharge noted.  Oral mucosa pink and  moist.  Conjunctival sacs are slightly pale.  NECK:  No jugular venous distention or bruits noted.  Thyroid nonpalpable.  No cervical adenopathy.  HEART:  Regular rhythm with tachycardic rate, approximately 100 apically.  No murmur, S3, S4 noted.  LUNGS:  Breath sounds clear and equal without distress.  O2 saturations 100%  on room air.  BREAST EXAM:  Deferred.  There was no axillary adenopathy.  ABDOMEN:  Soft and round with positive bowel sounds in all four quadrants.  No pain or masses to palpation.  No organomegaly noted.  URINARY/GENITAL:  Mild suprapubic pain with palpation.  No CVA tenderness.  Genital exam deferred, noncontributory.  RECTAL:  Exam deferred.  Was performed at [facility] with  report of occult blood positive stool.  MUSCULOSKELETAL:  Range of motion is full.  Strength 5/5 and equal.  NEUROLOGIC:  Cranial nerves II-XII grossly intact.  No unilateral or focal  defects noted.  INTEGUMENTARY:  Skin warm and dry.  May be slightly pale.  No ulcers or  suspicious lesions noted.  Turgor appears adequate.   IMPRESSIONS AND PLAN:  1. Likely upper gastrointestinal bleed with elevated BUN of 56.  Stool     occult blood positive with diarrhea.  Type and screen for 2 units of     packed red blood cells.  Hold aspirin and Evista.  Start proton pump     inhibitor IV.  NPO except ice chips.  GI consult with Dr. Madilyn Fireman.  2. Presyncope, likely secondary to #1.  Admit to telemetry bed, the service     of Dr. Elliot Gurney, [phone].  Close follow up of blood pressure.     Up to chair only with assistance.  Blood pressures do appear stable in     the supine position now.  Gentle IV  hydration.  3. Left lateral chest pain.  Enzymes negative x1 set.  EKG appears unchanged     compared to previous baseline.  Telemetry bed as above.  Would obtain two     more sets of enzymes to be thorough.  4. Anemia.  Last known baseline hemoglobin 14.4 done in 2000.  Hemoglobin     now 10.9.  Previous stool reported occult blood positive.  Would follow     hemoglobin and hematocrit closely post admission.  Type and screen two     units packed red blood cells.  Serial stools for occult blood.  GI     consult as above.  Would check coags, and anemia panel has been ordered.  5. Mild dehydration/azotemia.  Baseline BUN and creatinine 17 and 0.9.     Today's values are 56 and 1.2 respectively.  Gentle IV hydration.  NPO     status except ice chips.  Recheck basic metabolic panel in a.m.  6. Rule out urinary tract infection.  Pyuria per microscopic exam with 3-6     WBC's.  Cipro antibiotic therapy, and follow for urine culture.  No    current indication for blood culture.  7. Hypothyroidism on thyroid replacement.  Will hold this medication with     NPO status for now,  but restart as soon as possible.  Follow serum TSH     level.   DISPOSITION:  Telemetry bed service, Dr. Elliot Gurney, [phone].   CONDITION:  Guarded.   DIET:  NPO except ice chips.   ACTIVITY:  Up in chair as tolerated with assistance.     Everett Graff, N.P.                     Rosanne Sack, M.D.    TC/MEDQ  D:  09/05/2002  T:  09/05/2002  Job:  [id number]

## 2010-07-24 NOTE — Consult Note (Signed)
NAME:  Amanda [name], Amanda [name] NO.:  [id number]   MEDICAL RECORD NO.:  [id number]                   PATIENT TYPE:  EMS   LOCATION:  ED                                   FACILITY:  [facility]   PHYSICIAN:  Graylin Shiver, M.D.                DATE OF BIRTH:  [birth year]   DATE OF CONSULTATION:  09/05/2002  DATE OF DISCHARGE:                                   CONSULTATION   REFERRING PHYSICIAN:  Rosanne Sack, M.D.   PRIMARY PHYSICIAN:  Tammy R. Collins Scotland, M.D.   REASON FOR CONSULTATION:  This patient is a 77 year old Caucasian female who  presented to the emergency room at [facility] with complaints of  melena which began approximately 20 hours ago.  She has had approximately  six episodes of melenic stools.  Her last melenic stool was about six hours  ago.  This was confirmed to be Hemoccult positive.  The patient does state  that she took an iron pill two days ago.  She denies heartburn, epigastric  pain, dysphagia or odynophagia, vomiting, hematemesis, or bright red blood  per rectum.  She did have some dizziness today, and her concerns brought her  to the emergency room.  She had an EGD in November 2003, done by Dr. Dorena Cookey with findings suggestive of old peptic ulcer disease in the duodenal  bulb.  There was also a hiatal hernia.  A colonoscopy was also done in  November 2003, with findings of diverticulosis.   ALLERGIES:  1. CODEINE.  2. Possibly MYCIN DRUGS.  3. ACTONEL.   MEDICATIONS PRIOR TO ADMISSION:  1. Synthroid.  2. Multivitamin.  3. Ferrous sulfate.  4. Aspirin 81 mg daily.  5. Evista.   PAST SURGICAL HISTORY:  None.   SOCIAL HISTORY:  She does not smoke cigarettes or drink alcohol.   FAMILY HISTORY:  Mother died of kidney cancer.  Father and brother died of  myocardial infarctions.   REVIEW OF SYSTEMS:  Generally, she was feeling reasonably well prior to this  onset.  She denies poor appetite, dysphagia, or  odynophagia.  She currently  denies anginal chest pain, shortness of breath, cough, sputum production, or  hemoptysis.   PHYSICAL EXAMINATION:  VITAL SIGNS:  Blood pressure 122/78, pulse 117,  respirations 18.  GENERAL:  She is laying comfortably in bed.  She does not appear in any  acute distress.  HEENT:  She is not icteric.  NECK:  Supple with no masses, adenopathy, or goiter.  HEART:  Regular rhythm, no murmurs, rubs, or gallops.  LUNGS:  Clear.  ABDOMEN:  Bowel sounds normal, soft, nontender, no hepatosplenomegaly.   LABORATORY DATA:  Hemoglobin is 10.9.   IMPRESSION:  1. Upper gastrointestinal bleed presenting with melena.  This is probably a     mild bleed from either gastritis related to aspirin use or reactivation  of peptic ulcer disease.  2. Anemia secondary to #1.   PLAN:  The patient is admitted to the hospital for further evaluation and  treatment.  She will receive intravenous hydration, IV Protonix.  Serial  hemoglobin's and hematocrit's will be checked.  Blood will be typed and  screened.  I will proceed with an EGD in the morning to further evaluate the  source of the melena.   Thank you for this consult.                                                 Graylin Shiver, M.D.    [name]  D:  09/05/2002  T:  09/05/2002  Job:  [id number]   cc:   Rosanne Sack, M.D.  [address]  [place], [place] [address]  Fax: [phone]   Tammy R. Collins Scotland, M.D.  [address]  [place]  [place] [address]  Fax: [phone]   Everardo All. Madilyn Fireman, M.D.  [address]  [place]  [place] [address]  Fax: [phone]

## 2010-07-24 NOTE — Op Note (Signed)
   [name], Amanda [name]                           ACCOUNT NO.:  [id number]   MEDICAL RECORD NO.:  [id number]                   PATIENT TYPE:  AMB   LOCATION:  ENDO                                 FACILITY:  [facility]   PHYSICIAN:  John C. Madilyn Fireman, M.D.                 DATE OF BIRTH:  18-Mar-1922   DATE OF PROCEDURE:  01/30/2002  DATE OF DISCHARGE:                                 OPERATIVE REPORT   PROCEDURE:  Esophagogastroduodenoscopy with biopsy.   INDICATIONS FOR PROCEDURE:  Recent dark heme positive stools. The patient is  also scheduled to undergo colonoscopy today.   DESCRIPTION OF PROCEDURE:  The patient was placed in the left lateral  decubitus position then placed on the pulse monitor with continuous low flow  oxygen delivered by nasal cannula. She was sedated with 50 mcg IV fentanyl  and 4 mg IV Versed. The Olympus video endoscope was advanced under direct  vision into the oropharynx and esophagus. The esophagus was straight and of  normal caliber with the squamocolumnar line at 38 cm. There was a 3 cm  hiatal hernia distal to it. There also appeared to be a very thin widely  patent lower extremity esophageal ring.  There were no visible erosions and  no ulcers within the hernia sac. There was no suggestion of Barrett's  esophagus.  The stomach was entered and a small amount of liquid secretions  were suctioned from the fundus. Retroflexed view of the cardia confirmed a  hiatal hernia and was otherwise  unremarkable. The fundus, body, and antrum  all appeared normal. The pylorus was nondeformed and easily allowed passage  of the endoscope tip into the duodenum. The bulb did show a mild clover lead  deformity with no obvious erosions or ulcer. The post bulbar duodenum  appeared normal. The scope was withdrawn back in the stomach and a CLOtest  was obtained. The scope was then withdrawn and the patient returned to the  recovery room in stable condition.  She tolerated the procedure  well and  there were no immediate complications.   IMPRESSION:  1. Lower esophageal ring with hiatal hernia.  2. Clover leaf deformities suggesting previous peptic ulcer disease.   PLAN:  1. Await CLOtest and will treat for eradication of helicobacter if positive.  2. Proceed with colonoscopy.                                                John C. Madilyn Fireman, M.D.    JCH/MEDQ  D:  01/30/2002  T:  01/30/2002  Job:  [id number]   cc:   Tammy R. Collins Scotland, M.D.  [address]  [place]  [place] [address]  Fax: [phone]

## 2010-07-24 NOTE — Op Note (Signed)
   Amanda [name], SON                           ACCOUNT NO.:  [id number]   MEDICAL RECORD NO.:  [id number]                   PATIENT TYPE:  AMB   LOCATION:  ENDO                                 FACILITY:  [facility]   PHYSICIAN:  John C. Madilyn Fireman, M.D.                 DATE OF BIRTH:  16-Jun-1922   DATE OF PROCEDURE:  01/30/2002  DATE OF DISCHARGE:                                 OPERATIVE REPORT   PROCEDURE:  Colonoscopy.   INDICATION FOR PROCEDURE:  Heme-positive stool.   DESCRIPTION OF PROCEDURE:  The patient was placed in the left lateral  decubitus position and placed on the pulse monitor with continuous low-flow  oxygen delivered by nasal cannula.  She was sedated with 25 mcg IV fentanyl  and 3 mg IV Versed in addition to the medicine given for the previous EGD.  The Olympus video colonoscope was inserted into the rectum and advanced to  the cecum, confirmed by transillumination at McBurney's point and  visualization of the ileocecal valve and appendiceal orifice.  The prep was  good.  The cecum, ascending, transverse, and descending colon all appeared  normal with no masses, polyps, diverticula, or other mucosal abnormalities.  Within the sigmoid colon, there were seen numerous scattered diverticula and  no other abnormalities.  The rectum appeared normal, and retroflexed view of  the anus revealed only small internal hemorrhoids.  The colonoscope was then  withdrawn, and the patient returned to the recovery room in stable  condition.  She tolerated the procedure well, and there were no immediate  complications.   IMPRESSION:  Diverticulosis, otherwise normal colonoscopy.   PLAN:  Expectant management regarding further work-up for any suspected  occult blood loss.                                               John C. Madilyn Fireman, M.D.    JCH/MEDQ  D:  01/30/2002  T:  01/30/2002  Job:  [id number]   cc:   Tammy R. Collins Scotland, M.D.  [address]  [place]  [place] [address]  Fax: [phone]

## 2010-07-24 NOTE — Op Note (Signed)
   NAMESHAMAINE, MULKERN NO.:  [id number]   MEDICAL RECORD NO.:  [id number]                   PATIENT TYPE:  INP   LOCATION:  0373                                 FACILITY:  [facility]   PHYSICIAN:  Graylin Shiver, M.D.                DATE OF BIRTH:  11-04-1922   DATE OF PROCEDURE:  09/06/2002  DATE OF DISCHARGE:                                 OPERATIVE REPORT   PROCEDURE:  Esophagogastroduodenoscopy with biopsy.   INDICATION FOR PROCEDURE:  This 77 year old female was admitted with melena  and anemia.  Endoscopy is done to evaluate the upper GI tract.   Informed consent was obtained.   PREMEDICATION:  Fentanyl 25 mcg IV, Versed 2 mg IV.   PROCEDURE:  After informed consent was obtained after an explanation of the  risks of bleeding, infection, and perforation, conscious sedation was  achieved.  The Olympus gastroscope was inserted into the oral cavity and  passed into the esophagus.  ______________ to the stomach and into the  duodenum.  The second portion and bulb of the duodenum were normal.  The  stomach revealed two _________ gastric ulcers in the midbody of the stomach.  These were linear ulcers and of the greater curvature at the midbody.  There  was no evidence of active bleeding and no evidence of visible vessel.  Biopsies were obtained of the areas.  No other abnormalities were noted in  the stomach, including the fundus and the cardia seen on retroflexion.  The  esophagus looked normal in its entirety.  She tolerated the procedure well  without complications.   IMPRESSION:  Two small linear gastric ulcers on the midbody greater  curvature, which were not bleeding, and there was no evidence of visible  vessel.    PLAN:  The patient should remain on a proton pump inhibitor for healing of  the ulcer.  Biopsy will be checked.  If Helicobacter pylori is present, it  should be treated.  It is quite possible that the aspirin that she is taking  has contributed to the ulcers.                                               Graylin Shiver, M.D.    [name]  D:  09/06/2002  T:  09/06/2002  Job:  [id number]   cc:   Rosanne Sack, M.D.  [address]  [place], [place] [address]  Fax: [phone]   Tammy R. Collins Scotland, M.D.  [address]  [place]  [place] [address]  Fax: [phone]   Dr. Madilyn Fireman

## 2010-08-20 ENCOUNTER — Ambulatory Visit (INDEPENDENT_AMBULATORY_CARE_PROVIDER_SITE_OTHER): Payer: Medicare Other | Admitting: *Deleted

## 2010-08-20 DIAGNOSIS — I4891 Unspecified atrial fibrillation: Secondary | ICD-10-CM

## 2010-08-20 LAB — POCT INR: INR: 2.8

## 2010-09-03 ENCOUNTER — Telehealth: Payer: Self-pay | Admitting: Cardiology

## 2010-09-03 NOTE — Telephone Encounter (Signed)
Pt went to PCP and cholesterol was checked it and it was high and they talked about cholesterol meds and they want to talk about this

## 2010-09-04 NOTE — Telephone Encounter (Signed)
I spoke with daughter, Mrs. Axel Filler, at length regarding pt recent labs at her pcp, Dr. Yehuda Budd.  She said pcp wanted to possibly place pt on a statin or try red yeast rice and diet.  The lab results are done at pcp in house and were no longer available.  Daughter is concerned and would like input from Dr. Daleen Squibb.  I have called Dr. Alda Berthold office and they only have a note made by pcp regarding total cholesterol of 235 and LDL of 169. They will fax this to our office for Dr. Daleen Squibb to review.  They have planned to redraw in the next few weeks. since original results were no longer available in their system and will contact daughter. Mylo Red RN

## 2010-09-07 NOTE — Telephone Encounter (Signed)
Follow advice of Primary Care.

## 2010-09-17 ENCOUNTER — Ambulatory Visit (INDEPENDENT_AMBULATORY_CARE_PROVIDER_SITE_OTHER): Payer: Medicare Other | Admitting: *Deleted

## 2010-09-17 DIAGNOSIS — I4891 Unspecified atrial fibrillation: Secondary | ICD-10-CM

## 2010-10-02 ENCOUNTER — Encounter: Payer: Self-pay | Admitting: Cardiology

## 2010-10-07 ENCOUNTER — Ambulatory Visit (INDEPENDENT_AMBULATORY_CARE_PROVIDER_SITE_OTHER): Payer: Medicare Other | Admitting: Cardiology

## 2010-10-07 ENCOUNTER — Ambulatory Visit (INDEPENDENT_AMBULATORY_CARE_PROVIDER_SITE_OTHER): Payer: Medicare Other | Admitting: *Deleted

## 2010-10-07 ENCOUNTER — Encounter: Payer: Self-pay | Admitting: Cardiology

## 2010-10-07 VITALS — BP 160/80 | HR 84 | Resp 14 | Ht 63.0 in | Wt 125.0 lb

## 2010-10-07 DIAGNOSIS — Z7901 Long term (current) use of anticoagulants: Secondary | ICD-10-CM | POA: Insufficient documentation

## 2010-10-07 DIAGNOSIS — I4891 Unspecified atrial fibrillation: Secondary | ICD-10-CM

## 2010-10-07 DIAGNOSIS — I10 Essential (primary) hypertension: Secondary | ICD-10-CM

## 2010-10-07 LAB — POCT INR: INR: 2.4

## 2010-10-07 NOTE — Assessment & Plan Note (Signed)
Up a bit today. Advised to restrict sodium, continue metoprolol, and follow up with her primary care.

## 2010-10-07 NOTE — Progress Notes (Signed)
HPI Amanda [name] returns for evaluation and management of her chronic A. Fib.  He has minimal palpitations. He's had no melena or bleeding on Coumadin. He denies any falling. He loves to work in her yard and is very active.  EKG shows chronic atrial fibrillation with a well-controlled ventricular rate.  Past Medical History  Diagnosis Date  . Atrial fibrillation   . Chest pain   . HLD (hyperlipidemia)   . GERD (gastroesophageal reflux disease)   . Back pain   . Osteoarthritis     knee  . Hypothyroidism   . Osteoporosis   . PUD (peptic ulcer disease)     hx of upper GI bleeding   . Arthritis     Past Surgical History  Procedure Date  . Thyorid surgery   . Ulcer surgery     Family History  Problem Relation Age of Onset  . Cancer      kidney- family hx  . Coronary artery disease      family hx  . Arthritis      family hx    History   Social History  . Marital Status: Married    Spouse Name: N/A    Number of Children: N/A  . Years of Education: N/A   Occupational History  . Not on file.   Social History Main Topics  . Smoking status: Never Smoker   . Smokeless tobacco: Not on file   Comment: tobacco use - no  . Alcohol Use: No  . Drug Use: No  . Sexually Active: Not on file   Other Topics Concern  . Not on file   Social History Narrative  . No narrative on file    Allergies  Allergen Reactions  . Aspirin     REACTION: black tarry stools  . Codeine     REACTION: GI upset  . Sulfonamide Derivatives     Current Outpatient Prescriptions  Medication Sig Dispense Refill  . Calcium Carbonate-Vitamin D (CALCIUM-VITAMIN D) 600-200 MG-UNIT CAPS Take 1 capsule by mouth 3 (three) times daily.        . folic acid (FOLVITE) 400 MCG tablet Take 400 mcg by mouth daily.        . Garlic Oil 1000 MG CAPS Take 1 capsule by mouth daily.        Marland Kitchen levothyroxine (SYNTHROID, LEVOTHROID) 75 MCG tablet Take 75 mcg by mouth daily.        Marland Kitchen LORazepam (ATIVAN) 0.5 MG  tablet Take 0.25-0.5 mg by mouth at bedtime as needed.        . metoprolol (TOPROL-XL) 50 MG 24 hr tablet TAKE ONE TABLET BY MOUTH EVERY DAY  30 tablet  12  . Misc Natural Products (TURMERIC CURCUMIN) CAPS Take 1 capsule by mouth daily.        . Multiple Vitamins-Minerals (CENTRUM PO) Take 1 tablet by mouth daily.        . Omega-3 Fatty Acids (FISH OIL) 1000 MG CAPS Take 1 capsule by mouth 2 (two) times daily.        . Red Yeast Rice 600 MG CAPS Take 1 capsule by mouth daily.        . vitamin D, CHOLECALCIFEROL, 400 UNITS tablet Take 400 Units by mouth daily.        . vitamin E 400 UNIT capsule Take 400 Units by mouth daily.        Marland Kitchen warfarin (COUMADIN) 2.5 MG tablet Take 1 tablet (2.5 mg total) by mouth  as directed.  60 tablet  3    ROS Negative other than HPI.   PE General Appearance: well developed, well nourished in no acute distress HEENT: symmetrical face, PERRLA, good dentition  Neck: no JVD, thyromegaly, or adenopathy, trachea midline Chest: symmetric without deformity Cardiac: PMI non-displaced,irregular rate and rhythm, no gallop or murmur Lung: clear to ausculation and percussion Vascular: all pulses full without bruits  Abdominal: nondistended, nontender, good bowel sounds, no HSM, no bruits Extremities: no cyanosis, clubbing or edema, no sign of DVT, no varicosities  Skin: normal color, no rashes Neuro: alert and oriented x 3, non-focal Pysch: normal affect Filed Vitals:   10/07/10 1524  BP: 160/80  Pulse: 84  Resp: 14  Height: 5\' 3"  (1.6 m)  Weight: 125 lb (56.7 kg)    EKG  Labs and Studies Reviewed.   Lab Results  Component Value Date   WBC 15.4* 08/02/2009   HGB 14.1 08/02/2009   HCT 42.4 08/02/2009   MCV 95.7 08/02/2009   PLT 136* 08/02/2009      Chemistry      Component Value Date/Time   NA 133* 08/02/2009 1600   K 4.3 08/02/2009 1600   CL 101 08/02/2009 1600   CO2 23 08/02/2009 1600   BUN 21 08/02/2009 1600   CREATININE 1.22* 08/02/2009 1600        Component Value Date/Time   CALCIUM 8.7 08/02/2009 1600   ALKPHOS 63 08/02/2009 1600   AST 31 08/02/2009 1600   ALT 14 08/02/2009 1600   BILITOT 1.9* 08/02/2009 1600       No results found for this basename: CHOL   No results found for this basename: HDL   No results found for this basename: LDLCALC   No results found for this basename: TRIG   No results found for this basename: CHOLHDL   No results found for this basename: HGBA1C   Lab Results  Component Value Date   ALT 14 08/02/2009   AST 31 08/02/2009   ALKPHOS 63 08/02/2009   BILITOT 1.9* 08/02/2009   No results found for this basename: TSH   Past Medical History  Diagnosis Date  . Atrial fibrillation   . Chest pain   . HLD (hyperlipidemia)   . GERD (gastroesophageal reflux disease)   . Back pain   . Osteoarthritis     knee  . Hypothyroidism   . Osteoporosis   . PUD (peptic ulcer disease)     hx of upper GI bleeding   . Arthritis     Past Surgical History  Procedure Date  . Thyorid surgery   . Ulcer surgery     Family History  Problem Relation Age of Onset  . Cancer      kidney- family hx  . Coronary artery disease      family hx  . Arthritis      family hx    History   Social History  . Marital Status: Married    Spouse Name: N/A    Number of Children: N/A  . Years of Education: N/A   Occupational History  . Not on file.   Social History Main Topics  . Smoking status: Never Smoker   . Smokeless tobacco: Not on file   Comment: tobacco use - no  . Alcohol Use: No  . Drug Use: No  . Sexually Active: Not on file   Other Topics Concern  . Not on file   Social History Narrative  . No narrative  on file    Allergies  Allergen Reactions  . Aspirin     REACTION: black tarry stools  . Codeine     REACTION: GI upset  . Sulfonamide Derivatives     Current Outpatient Prescriptions  Medication Sig Dispense Refill  . Calcium Carbonate-Vitamin D (CALCIUM-VITAMIN D) 600-200 MG-UNIT CAPS  Take 1 capsule by mouth 3 (three) times daily.        . folic acid (FOLVITE) 400 MCG tablet Take 400 mcg by mouth daily.        . Garlic Oil 1000 MG CAPS Take 1 capsule by mouth daily.        Marland Kitchen levothyroxine (SYNTHROID, LEVOTHROID) 75 MCG tablet Take 75 mcg by mouth daily.        Marland Kitchen LORazepam (ATIVAN) 0.5 MG tablet Take 0.25-0.5 mg by mouth at bedtime as needed.        . metoprolol (TOPROL-XL) 50 MG 24 hr tablet TAKE ONE TABLET BY MOUTH EVERY DAY  30 tablet  12  . Misc Natural Products (TURMERIC CURCUMIN) CAPS Take 1 capsule by mouth daily.        . Multiple Vitamins-Minerals (CENTRUM PO) Take 1 tablet by mouth daily.        . Omega-3 Fatty Acids (FISH OIL) 1000 MG CAPS Take 1 capsule by mouth 2 (two) times daily.        . Red Yeast Rice 600 MG CAPS Take 1 capsule by mouth daily.        . vitamin D, CHOLECALCIFEROL, 400 UNITS tablet Take 400 Units by mouth daily.        . vitamin E 400 UNIT capsule Take 400 Units by mouth daily.        Marland Kitchen warfarin (COUMADIN) 2.5 MG tablet Take 1 tablet (2.5 mg total) by mouth as directed.  60 tablet  3    ROS Negative other than HPI.   PE  Filed Vitals:   10/07/10 1524  BP: 160/80  Pulse: 84  Resp: 14  Height: 5\' 3"  (1.6 m)  Weight: 125 lb (56.7 kg)    EKG  Labs and Studies Reviewed.   Lab Results  Component Value Date   WBC 15.4* 08/02/2009   HGB 14.1 08/02/2009   HCT 42.4 08/02/2009   MCV 95.7 08/02/2009   PLT 136* 08/02/2009      Chemistry      Component Value Date/Time   NA 133* 08/02/2009 1600   K 4.3 08/02/2009 1600   CL 101 08/02/2009 1600   CO2 23 08/02/2009 1600   BUN 21 08/02/2009 1600   CREATININE 1.22* 08/02/2009 1600      Component Value Date/Time   CALCIUM 8.7 08/02/2009 1600   ALKPHOS 63 08/02/2009 1600   AST 31 08/02/2009 1600   ALT 14 08/02/2009 1600   BILITOT 1.9* 08/02/2009 1600       No results found for this basename: CHOL   No results found for this basename: HDL   No results found for this basename: LDLCALC    No results found for this basename: TRIG   No results found for this basename: CHOLHDL   No results found for this basename: HGBA1C   Lab Results  Component Value Date   ALT 14 08/02/2009   AST 31 08/02/2009   ALKPHOS 63 08/02/2009   BILITOT 1.9* 08/02/2009   No results found for this basename: TSH

## 2010-10-07 NOTE — Patient Instructions (Signed)
Your physician has recommended you make the following change in your medication:   Stop taking the following medications: Garlic capasules, omega-3 fish oil, and vitamin E  Your physician recommends that you schedule a follow-up appointment in: 1 year with Dr. Daleen Squibb  Continue to reduce your salt intake  Follow-up with your primary care Dr. Yehuda Budd regularly

## 2010-10-07 NOTE — Assessment & Plan Note (Signed)
Stable. No change in treatment. 

## 2010-10-19 ENCOUNTER — Other Ambulatory Visit: Payer: Self-pay | Admitting: Cardiology

## 2010-11-04 ENCOUNTER — Encounter: Payer: Medicare Other | Admitting: *Deleted

## 2010-11-19 ENCOUNTER — Ambulatory Visit (INDEPENDENT_AMBULATORY_CARE_PROVIDER_SITE_OTHER): Payer: Medicare Other | Admitting: *Deleted

## 2010-11-19 DIAGNOSIS — I4891 Unspecified atrial fibrillation: Secondary | ICD-10-CM

## 2010-11-19 LAB — POCT INR: INR: 3.1

## 2010-12-17 ENCOUNTER — Ambulatory Visit (INDEPENDENT_AMBULATORY_CARE_PROVIDER_SITE_OTHER): Payer: Medicare Other | Admitting: *Deleted

## 2010-12-17 DIAGNOSIS — I4891 Unspecified atrial fibrillation: Secondary | ICD-10-CM

## 2010-12-17 LAB — POCT INR: INR: 2.8

## 2011-01-14 ENCOUNTER — Ambulatory Visit (INDEPENDENT_AMBULATORY_CARE_PROVIDER_SITE_OTHER): Payer: Medicare Other | Admitting: *Deleted

## 2011-01-14 DIAGNOSIS — Z7901 Long term (current) use of anticoagulants: Secondary | ICD-10-CM

## 2011-01-14 DIAGNOSIS — I4891 Unspecified atrial fibrillation: Secondary | ICD-10-CM

## 2011-01-14 LAB — POCT INR: INR: 3.2

## 2011-02-11 ENCOUNTER — Ambulatory Visit (INDEPENDENT_AMBULATORY_CARE_PROVIDER_SITE_OTHER): Payer: Medicare Other | Admitting: *Deleted

## 2011-02-11 DIAGNOSIS — Z7901 Long term (current) use of anticoagulants: Secondary | ICD-10-CM

## 2011-02-11 DIAGNOSIS — I4891 Unspecified atrial fibrillation: Secondary | ICD-10-CM

## 2011-02-22 ENCOUNTER — Other Ambulatory Visit: Payer: Self-pay | Admitting: Cardiology

## 2011-03-11 ENCOUNTER — Ambulatory Visit (INDEPENDENT_AMBULATORY_CARE_PROVIDER_SITE_OTHER): Payer: Medicare Other | Admitting: *Deleted

## 2011-03-11 DIAGNOSIS — Z7901 Long term (current) use of anticoagulants: Secondary | ICD-10-CM

## 2011-03-11 DIAGNOSIS — I4891 Unspecified atrial fibrillation: Secondary | ICD-10-CM

## 2011-03-11 LAB — POCT INR: INR: 2.6

## 2011-04-08 ENCOUNTER — Ambulatory Visit (INDEPENDENT_AMBULATORY_CARE_PROVIDER_SITE_OTHER): Payer: Medicare Other | Admitting: Pharmacist

## 2011-04-08 DIAGNOSIS — I4891 Unspecified atrial fibrillation: Secondary | ICD-10-CM

## 2011-04-08 DIAGNOSIS — Z7901 Long term (current) use of anticoagulants: Secondary | ICD-10-CM

## 2011-04-08 LAB — POCT INR: INR: 2.7

## 2011-05-13 ENCOUNTER — Ambulatory Visit (INDEPENDENT_AMBULATORY_CARE_PROVIDER_SITE_OTHER): Payer: Medicare Other | Admitting: *Deleted

## 2011-05-13 DIAGNOSIS — I4891 Unspecified atrial fibrillation: Secondary | ICD-10-CM

## 2011-05-13 DIAGNOSIS — Z7901 Long term (current) use of anticoagulants: Secondary | ICD-10-CM

## 2011-05-26 ENCOUNTER — Other Ambulatory Visit: Payer: Self-pay | Admitting: Dermatology

## 2011-05-29 ENCOUNTER — Other Ambulatory Visit: Payer: Self-pay | Admitting: Cardiology

## 2011-06-10 ENCOUNTER — Ambulatory Visit (INDEPENDENT_AMBULATORY_CARE_PROVIDER_SITE_OTHER): Payer: Medicare Other

## 2011-06-10 DIAGNOSIS — I4891 Unspecified atrial fibrillation: Secondary | ICD-10-CM

## 2011-06-10 DIAGNOSIS — Z7901 Long term (current) use of anticoagulants: Secondary | ICD-10-CM

## 2011-07-08 ENCOUNTER — Ambulatory Visit (INDEPENDENT_AMBULATORY_CARE_PROVIDER_SITE_OTHER): Payer: Medicare Other | Admitting: *Deleted

## 2011-07-08 DIAGNOSIS — Z7901 Long term (current) use of anticoagulants: Secondary | ICD-10-CM

## 2011-07-08 DIAGNOSIS — I4891 Unspecified atrial fibrillation: Secondary | ICD-10-CM

## 2011-07-08 LAB — POCT INR: INR: 4.7

## 2011-07-22 ENCOUNTER — Ambulatory Visit (INDEPENDENT_AMBULATORY_CARE_PROVIDER_SITE_OTHER): Payer: Medicare Other | Admitting: *Deleted

## 2011-07-22 DIAGNOSIS — Z7901 Long term (current) use of anticoagulants: Secondary | ICD-10-CM

## 2011-07-22 DIAGNOSIS — I4891 Unspecified atrial fibrillation: Secondary | ICD-10-CM

## 2011-08-12 ENCOUNTER — Ambulatory Visit (INDEPENDENT_AMBULATORY_CARE_PROVIDER_SITE_OTHER): Payer: Medicare Other | Admitting: *Deleted

## 2011-08-12 DIAGNOSIS — Z7901 Long term (current) use of anticoagulants: Secondary | ICD-10-CM

## 2011-08-12 DIAGNOSIS — I4891 Unspecified atrial fibrillation: Secondary | ICD-10-CM

## 2011-08-14 ENCOUNTER — Other Ambulatory Visit: Payer: Self-pay | Admitting: Cardiology

## 2011-09-06 ENCOUNTER — Ambulatory Visit (INDEPENDENT_AMBULATORY_CARE_PROVIDER_SITE_OTHER): Payer: Medicare Other | Admitting: Pharmacist

## 2011-09-06 DIAGNOSIS — Z7901 Long term (current) use of anticoagulants: Secondary | ICD-10-CM

## 2011-09-06 DIAGNOSIS — I4891 Unspecified atrial fibrillation: Secondary | ICD-10-CM

## 2011-09-06 LAB — POCT INR: INR: 3.5

## 2011-09-30 ENCOUNTER — Ambulatory Visit (INDEPENDENT_AMBULATORY_CARE_PROVIDER_SITE_OTHER): Payer: Medicare Other | Admitting: *Deleted

## 2011-09-30 DIAGNOSIS — Z7901 Long term (current) use of anticoagulants: Secondary | ICD-10-CM

## 2011-09-30 DIAGNOSIS — I4891 Unspecified atrial fibrillation: Secondary | ICD-10-CM

## 2011-09-30 LAB — POCT INR: INR: 3.9

## 2011-10-12 ENCOUNTER — Other Ambulatory Visit: Payer: Self-pay | Admitting: Cardiology

## 2011-10-14 ENCOUNTER — Ambulatory Visit (INDEPENDENT_AMBULATORY_CARE_PROVIDER_SITE_OTHER): Payer: Medicare Other | Admitting: *Deleted

## 2011-10-14 DIAGNOSIS — Z7901 Long term (current) use of anticoagulants: Secondary | ICD-10-CM

## 2011-10-14 DIAGNOSIS — I4891 Unspecified atrial fibrillation: Secondary | ICD-10-CM

## 2011-11-11 ENCOUNTER — Ambulatory Visit (INDEPENDENT_AMBULATORY_CARE_PROVIDER_SITE_OTHER): Payer: Medicare Other

## 2011-11-11 DIAGNOSIS — Z7901 Long term (current) use of anticoagulants: Secondary | ICD-10-CM

## 2011-11-11 DIAGNOSIS — I4891 Unspecified atrial fibrillation: Secondary | ICD-10-CM

## 2011-12-02 ENCOUNTER — Ambulatory Visit: Payer: Medicare Other | Admitting: Cardiology

## 2011-12-09 ENCOUNTER — Ambulatory Visit (INDEPENDENT_AMBULATORY_CARE_PROVIDER_SITE_OTHER): Payer: Medicare Other | Admitting: *Deleted

## 2011-12-09 DIAGNOSIS — I4891 Unspecified atrial fibrillation: Secondary | ICD-10-CM

## 2011-12-09 DIAGNOSIS — Z7901 Long term (current) use of anticoagulants: Secondary | ICD-10-CM

## 2012-01-13 ENCOUNTER — Ambulatory Visit (INDEPENDENT_AMBULATORY_CARE_PROVIDER_SITE_OTHER): Payer: Medicare Other | Admitting: *Deleted

## 2012-01-13 DIAGNOSIS — Z7901 Long term (current) use of anticoagulants: Secondary | ICD-10-CM

## 2012-01-13 DIAGNOSIS — I4891 Unspecified atrial fibrillation: Secondary | ICD-10-CM

## 2012-01-13 LAB — POCT INR: INR: 2.5

## 2012-02-24 ENCOUNTER — Ambulatory Visit (INDEPENDENT_AMBULATORY_CARE_PROVIDER_SITE_OTHER): Payer: Medicare Other | Admitting: Pharmacist

## 2012-02-24 DIAGNOSIS — Z7901 Long term (current) use of anticoagulants: Secondary | ICD-10-CM

## 2012-02-24 DIAGNOSIS — I4891 Unspecified atrial fibrillation: Secondary | ICD-10-CM

## 2012-02-29 ENCOUNTER — Other Ambulatory Visit: Payer: Self-pay | Admitting: Cardiology

## 2012-02-29 MED ORDER — WARFARIN SODIUM 2.5 MG PO TABS: ORAL_TABLET | ORAL | Status: DC

## 2012-03-14 ENCOUNTER — Other Ambulatory Visit: Payer: Self-pay | Admitting: *Deleted

## 2012-03-14 MED ORDER — METOPROLOL SUCCINATE ER 50 MG PO TB24: 50.0000 mg | ORAL_TABLET | Freq: Every day | ORAL | Status: DC

## 2012-03-29 ENCOUNTER — Ambulatory Visit: Payer: Medicare Other | Admitting: Cardiology

## 2012-04-05 ENCOUNTER — Encounter: Payer: Self-pay | Admitting: Cardiology

## 2012-04-05 ENCOUNTER — Ambulatory Visit (INDEPENDENT_AMBULATORY_CARE_PROVIDER_SITE_OTHER): Payer: Medicare Other | Admitting: *Deleted

## 2012-04-05 ENCOUNTER — Ambulatory Visit (INDEPENDENT_AMBULATORY_CARE_PROVIDER_SITE_OTHER): Payer: Medicare Other | Admitting: Cardiology

## 2012-04-05 VITALS — BP 160/84 | HR 88 | Ht 63.0 in | Wt 126.0 lb

## 2012-04-05 DIAGNOSIS — I4891 Unspecified atrial fibrillation: Secondary | ICD-10-CM

## 2012-04-05 DIAGNOSIS — Z7901 Long term (current) use of anticoagulants: Secondary | ICD-10-CM

## 2012-04-05 DIAGNOSIS — I10 Essential (primary) hypertension: Secondary | ICD-10-CM

## 2012-04-05 MED ORDER — METOPROLOL TARTRATE 25 MG PO TABS: 25.0000 mg | ORAL_TABLET | Freq: Two times a day (BID) | ORAL | Status: DC

## 2012-04-05 NOTE — Assessment & Plan Note (Signed)
Good rate control and has been very compliant with therapeutic INRs. No change. Return in one year.

## 2012-04-05 NOTE — Assessment & Plan Note (Signed)
Well controlled 

## 2012-04-05 NOTE — Assessment & Plan Note (Signed)
Good therapeutic levels and compliance. No falls. No bleeding reported. No change in current therapy.

## 2012-04-05 NOTE — Patient Instructions (Addendum)
Stop Toprol  Start Metoprolol tartrate 25mg . 1 tablet twice a day  Your physician wants you to follow-up in: 1 year with Dr. Daleen Squibb. You will receive a reminder letter in the mail two months in advance. If you don't receive a letter, please call our office to schedule the follow-up appointment.

## 2012-04-05 NOTE — Progress Notes (Signed)
HPI Amanda [name] returns today for evaluation and management of her chronic atrial for ablation and anticoagulation.  She is totally asymptomatic and denies any symptoms of palpitations, presyncope, syncope or falls. She has no chest pain or dyspnea on exertion. She denies any bleeding. Her INRs have been consistently therapeutic.  Her daughter wants to change her to metoprolol tartrate for cost.  Past Medical History  Diagnosis Date  . Atrial fibrillation   . Chest pain   . HLD (hyperlipidemia)   . GERD (gastroesophageal reflux disease)   . Back pain   . Osteoarthritis     knee  . Hypothyroidism   . Osteoporosis   . PUD (peptic ulcer disease)     hx of upper GI bleeding   . Arthritis     Current Outpatient Prescriptions  Medication Sig Dispense Refill  . Calcium Carbonate-Vitamin D (CALCIUM-VITAMIN D) 600-200 MG-UNIT CAPS Take 1 capsule by mouth 3 (three) times daily.        . fish oil-omega-3 fatty acids 1000 MG capsule Take 1,200 mg by mouth daily.      . folic acid (FOLVITE) 400 MCG tablet Take 400 mcg by mouth daily.        Marland Kitchen levothyroxine (SYNTHROID, LEVOTHROID) 75 MCG tablet Take 75 mcg by mouth daily.        Marland Kitchen LORazepam (ATIVAN) 0.5 MG tablet Take 0.25-0.5 mg by mouth at bedtime as needed.        . Misc Natural Products (TURMERIC CURCUMIN) CAPS Take 1 capsule by mouth daily.        . Multiple Vitamins-Minerals (CENTRUM PO) Take 1 tablet by mouth daily.        . pravastatin (PRAVACHOL) 40 MG tablet Take 40 mg by mouth daily.      . vitamin D, CHOLECALCIFEROL, 400 UNITS tablet Take 400 Units by mouth daily.       Marland Kitchen warfarin (COUMADIN) 2.5 MG tablet Take as directed by anticoagulation clinic  60 tablet  3  . metoprolol tartrate (LOPRESSOR) 25 MG tablet Take 1 tablet (25 mg total) by mouth 2 (two) times daily.  180 tablet  3    Allergies  Allergen Reactions  . Aspirin     REACTION: black tarry stools  . Codeine     REACTION: GI upset  . Sulfonamide Derivatives      Family History  Problem Relation Age of Onset  . Cancer      kidney- family hx  . Coronary artery disease      family hx  . Arthritis      family hx    History   Social History  . Marital Status: Married    Spouse Name: N/A    Number of Children: N/A  . Years of Education: N/A   Occupational History  . Not on file.   Social History Main Topics  . Smoking status: Never Smoker   . Smokeless tobacco: Not on file     Comment: tobacco use - no  . Alcohol Use: No  . Drug Use: No  . Sexually Active: Not on file   Other Topics Concern  . Not on file   Social History Narrative  . No narrative on file    ROS ALL NEGATIVE EXCEPT THOSE NOTED IN HPI  PE  General Appearance: well developed, well nourished in no acute distress, looks younger than stated age HEENT: symmetrical face, PERRLA, good dentition  Neck: no JVD, thyromegaly, or adenopathy, trachea midline Chest: symmetric without  deformity Cardiac: PMI non-displaced, irregular rate and rhythm, normal S1, S2, no gallop or murmur Lung: clear to ausculation and percussion Vascular: all pulses full without bruits  Abdominal: nondistended, nontender, good bowel sounds, no HSM, no bruits Extremities: no cyanosis, clubbing or edema, no sign of DVT, no varicosities  Skin: normal color, no rashes Neuro: alert and oriented x 3, non-focal Pysch: normal affect  EKG Chronic atrial fibrillation with occasional PVC, well-controlled rate BMET    Component Value Date/Time   NA 133* 08/02/2009 1600   K 4.3 08/02/2009 1600   CL 101 08/02/2009 1600   CO2 23 08/02/2009 1600   GLUCOSE 145* 08/02/2009 1600   BUN 21 08/02/2009 1600   CREATININE 1.22* 08/02/2009 1600   CALCIUM 8.7 08/02/2009 1600   GFRNONAA 42* 08/02/2009 1600   GFRAA  Value: 51        The eGFR has been calculated using the MDRD equation. This calculation has not been validated in all clinical situations. eGFR's persistently <60 mL/min signify possible Chronic Kidney  Disease.* 08/02/2009 1600    Lipid Panel  No results found for this basename: chol, trig, hdl, cholhdl, vldl, ldlcalc    CBC    Component Value Date/Time   WBC 15.4* 08/02/2009 1600   RBC 4.43 08/02/2009 1600   HGB 14.1 08/02/2009 1600   HCT 42.4 08/02/2009 1600   PLT 136* 08/02/2009 1600   MCV 95.7 08/02/2009 1600   MCHC 33.3 08/02/2009 1600   RDW 13.9 08/02/2009 1600   LYMPHSABS 0.7 08/02/2009 1600   MONOABS 1.1* 08/02/2009 1600   EOSABS 0.0 08/02/2009 1600   BASOSABS 0.0 08/02/2009 1600

## 2012-04-10 ENCOUNTER — Telehealth: Payer: Self-pay | Admitting: Cardiology

## 2012-04-10 DIAGNOSIS — I4891 Unspecified atrial fibrillation: Secondary | ICD-10-CM

## 2012-04-10 MED ORDER — METOPROLOL TARTRATE 25 MG PO TABS: 25.0000 mg | ORAL_TABLET | Freq: Two times a day (BID) | ORAL | Status: DC

## 2012-04-10 NOTE — Telephone Encounter (Signed)
New problem:   Metoprolol  25 mg    [facility] on [address]

## 2012-04-10 NOTE — Telephone Encounter (Signed)
Dr Vern Claude escribe is still down. Rx was called in to [facility] on [address] to replace Metoprolol Succinate 50mg . See prior phone note for rx info     Micki Riley CMA

## 2012-04-10 NOTE — Telephone Encounter (Signed)
Called patient back about rx sent to [facility] on [address] on 04-05-12. Spoke with son. Son states the pharmacy told him we never sent them a new rx for metoprolol tartrate 25mg  twice daily. I resent rx to [facility] on [address] and asked the son to call us back if there were anymore problems with this refill. Metoprolol Tartrate 25mg  BID, #180, 3 refills   Deidrick Rainey CMA

## 2012-04-14 ENCOUNTER — Ambulatory Visit: Payer: Self-pay | Admitting: Cardiology

## 2012-04-14 DIAGNOSIS — I4891 Unspecified atrial fibrillation: Secondary | ICD-10-CM

## 2012-04-14 DIAGNOSIS — Z7901 Long term (current) use of anticoagulants: Secondary | ICD-10-CM

## 2013-04-09 ENCOUNTER — Other Ambulatory Visit: Payer: Self-pay | Admitting: Cardiology

## 2013-04-10 ENCOUNTER — Other Ambulatory Visit: Payer: Self-pay | Admitting: *Deleted

## 2013-04-10 DIAGNOSIS — I4891 Unspecified atrial fibrillation: Secondary | ICD-10-CM

## 2013-04-10 MED ORDER — METOPROLOL TARTRATE 25 MG PO TABS: 25.0000 mg | ORAL_TABLET | Freq: Two times a day (BID) | ORAL | Status: DC

## 2013-04-10 NOTE — Telephone Encounter (Signed)
Needs appt with Eloy EndK Nelson, MD

## 2013-04-26 ENCOUNTER — Other Ambulatory Visit: Payer: Self-pay | Admitting: Cardiology

## 2013-04-30 ENCOUNTER — Other Ambulatory Visit: Payer: Self-pay

## 2013-05-28 ENCOUNTER — Encounter: Payer: Self-pay | Admitting: *Deleted

## 2013-05-30 ENCOUNTER — Other Ambulatory Visit: Payer: Self-pay | Admitting: Cardiology

## 2013-06-07 ENCOUNTER — Encounter: Payer: Self-pay | Admitting: Cardiology

## 2013-06-07 ENCOUNTER — Ambulatory Visit (INDEPENDENT_AMBULATORY_CARE_PROVIDER_SITE_OTHER): Payer: Medicare Other | Admitting: Cardiology

## 2013-06-07 VITALS — BP 176/90 | HR 66 | Ht 63.0 in | Wt 125.0 lb

## 2013-06-07 DIAGNOSIS — I509 Heart failure, unspecified: Secondary | ICD-10-CM

## 2013-06-07 NOTE — Progress Notes (Signed)
Patient ID: Amanda [name], female   DOB: 1922-10-21, 80 y.o.   MRN: [id number]     Patient Name: Amanda [name] Date of Encounter: 06/07/2013  Primary Care Provider:  Herb Grays, MD Primary Cardiologist:  Lars Masson Previously Dr Daleen Squibb  Problem List   Past Medical History  Diagnosis Date  . Atrial fibrillation   . Chest pain   . HLD (hyperlipidemia)   . GERD (gastroesophageal reflux disease)   . Back pain   . Osteoarthritis     knee  . Hypothyroidism   . Osteoporosis   . PUD (peptic ulcer disease)     hx of upper GI bleeding   . Arthritis    Past Surgical History  Procedure Laterality Date  . Thyroid surgery    . Ulcer surgery      Allergies  Allergies  Allergen Reactions  . Aspirin     REACTION: black tarry stools  . Codeine     REACTION: GI upset  . Sulfonamide Derivatives     HPI  Mrs. Ammons returns today for evaluation and management of her chronic atrial fibrillation and anticoagulation.  She is totally asymptomatic and denies any symptoms of palpitations, presyncope, syncope or falls. She has no chest pain or dyspnea on exertion. She denies any bleeding. Her INRs have been consistently therapeutic.  She still very active working in her backyard. It isolated episode of fall in the bathroom approximately 6 months ago.   Home Medications  Prior to Admission medications   Medication Sig Start Date End Date Taking? Authorizing Provider  Calcium Carbonate-Vitamin D (CALCIUM 600+D) 600-400 MG-UNIT per tablet Take 1 tablet by mouth 2 (two) times daily.   Yes Historical Provider, MD  folic acid (FOLVITE) 400 MCG tablet Take 400 mcg by mouth daily.     Yes Historical Provider, MD  levothyroxine (SYNTHROID, LEVOTHROID) 75 MCG tablet Take 75 mcg by mouth daily.     Yes Historical Provider, MD  LORazepam (ATIVAN) 0.5 MG tablet Take 0.25-0.5 mg by mouth at bedtime as needed.     Yes Historical Provider, MD  metoprolol tartrate (LOPRESSOR) 25 MG tablet TAKE  ONE TABLET BY MOUTH TWICE DAILY.  MUST SCHEDULE APPOINTMENT FOR FURTHER REFILLS. 05/30/13  Yes Lars Masson, MD  Misc Natural Products (TURMERIC CURCUMIN) CAPS Take 1 capsule by mouth daily.     Yes Historical Provider, MD  Multiple Vitamins-Minerals (CENTRUM PO) Take 1 tablet by mouth daily.     Yes Historical Provider, MD  Omega-3 Fatty Acids (FISH OIL) 1200 MG CAPS Take by mouth 2 (two) times daily.   Yes Historical Provider, MD  pravastatin (PRAVACHOL) 40 MG tablet Take 40 mg by mouth 2 (two) times daily.    Yes Historical Provider, MD  vitamin D, CHOLECALCIFEROL, 400 UNITS tablet Take 400 Units by mouth daily.    Yes Historical Provider, MD  warfarin (COUMADIN) 2.5 MG tablet Take as directed by anticoagulation clinic 02/29/12  Yes Gaylord Shih, MD    Family History  Family History  Problem Relation Age of Onset  . Kidney cancer      family hx  . Coronary artery disease      family hx  . Arthritis      family hx    Social History  History   Social History  . Marital Status: Married    Spouse Name: N/A    Number of Children: N/A  . Years of Education: N/A   Occupational History  . Not  on file.   Social History Main Topics  . Smoking status: Never Smoker   . Smokeless tobacco: Not on file     Comment: tobacco use - no  . Alcohol Use: No  . Drug Use: No  . Sexual Activity: Not on file   Other Topics Concern  . Not on file   Social History Narrative  . No narrative on file     Review of Systems, as per HPI, otherwise negative General:  No chills, fever, night sweats or weight changes.  Cardiovascular:  No chest pain, dyspnea on exertion, edema, orthopnea, palpitations, paroxysmal nocturnal dyspnea. Dermatological: No rash, lesions/masses Respiratory: No cough, dyspnea Urologic: No hematuria, dysuria Abdominal:   No nausea, vomiting, diarrhea, bright red blood per rectum, melena, or hematemesis Neurologic:  No visual changes, wkns, changes in mental  status. All other systems reviewed and are otherwise negative except as noted above.  Physical Exam  Blood pressure 176/90, pulse 66, height 5\' 3"  (1.6 m), weight 125 lb (56.7 kg).  General: Pleasant, NAD Psych: Normal affect. Neuro: Alert and oriented X 3. Moves all extremities spontaneously. HEENT: Normal  Neck: Supple without bruits or JVD. Lungs:  Resp regular and unlabored, CTA. Heart: Irregularly irregular no s3, s4, or murmurs. Abdomen: Soft, non-tender, non-distended, BS + x 4.  Extremities: No clubbing, cyanosis or edema. DP/PT/Radials 2+ and equal bilaterally.  Labs:  No results found for this basename: CKTOTAL, CKMB, TROPONINI,  in the last 72 hours Lab Results  Component Value Date   WBC 15.4* 08/02/2009   HGB 14.1 08/02/2009   HCT 42.4 08/02/2009   MCV 95.7 08/02/2009   PLT 136* 08/02/2009   Accessory Clinical Findings  Echocardiogram - 07/03/2009  - Left ventricle: There is anterolateral hypokinesis. The cavity size was normal. Wall thickness was normal. The estimated ejection fraction was 40%. - Mitral valve: Mild regurgitation. - Left atrium: The atrium was moderately dilated. - Right atrium: The atrium was mildly dilated. - Tricuspid valve: Mild-moderate regurgitation. - Pulmonary arteries: PA peak pressure: 42mm Hg (S).  ECG - atrial fibrillation with controlled ventricular rate 66 beats per minute, abnormal EKG   Assessment & Plan  Very pleasant functional 80 year old female  1. chronic atrial fibrillation - rate controlled with metoprolol to continue compliant to warfarin.  2.  Chronic systolic CHF, LV EF 40% in 2011. Patient is euvolemic and well compensated. We'll repeat echocardiogram to reevaluate LV ejection fraction.  3. Hypertension - based on patient's son always controlled at home with repeated here 145 systolic. We'll continue the same medication  4. Hyperlipidemia- followed by primary care physician and  Followup in 1 year    Lars MassonNELSON,  Khala Tarte H, MD, [facility]FACC 06/07/2013, 12:23 PM

## 2013-06-07 NOTE — Patient Instructions (Signed)
Your physician recommends that you continue on your current medications as directed. Please refer to the Current Medication list given to you today.  Your physician has requested that you have an echocardiogram. Echocardiography is a painless test that uses sound waves to create images of your heart. It provides your doctor with information about the size and shape of your heart and how well your heart's chambers and valves are working. This procedure takes approximately one hour. There are no restrictions for this procedure.  Your physician recommends that you schedule a follow-up appointment in: ONE YEAR

## 2013-06-14 ENCOUNTER — Other Ambulatory Visit: Payer: Self-pay | Admitting: Cardiology

## 2013-06-21 ENCOUNTER — Encounter: Payer: Self-pay | Admitting: Cardiology

## 2013-06-21 ENCOUNTER — Ambulatory Visit (HOSPITAL_COMMUNITY): Payer: Medicare Other | Attending: Cardiology | Admitting: Radiology

## 2013-06-21 DIAGNOSIS — R079 Chest pain, unspecified: Secondary | ICD-10-CM | POA: Insufficient documentation

## 2013-06-21 DIAGNOSIS — R9431 Abnormal electrocardiogram [ECG] [EKG]: Secondary | ICD-10-CM

## 2013-06-21 DIAGNOSIS — E785 Hyperlipidemia, unspecified: Secondary | ICD-10-CM | POA: Insufficient documentation

## 2013-06-21 DIAGNOSIS — I059 Rheumatic mitral valve disease, unspecified: Secondary | ICD-10-CM | POA: Insufficient documentation

## 2013-06-21 DIAGNOSIS — I4891 Unspecified atrial fibrillation: Secondary | ICD-10-CM | POA: Insufficient documentation

## 2013-06-21 DIAGNOSIS — I509 Heart failure, unspecified: Secondary | ICD-10-CM

## 2013-06-21 DIAGNOSIS — I10 Essential (primary) hypertension: Secondary | ICD-10-CM | POA: Insufficient documentation

## 2013-06-21 NOTE — Progress Notes (Signed)
Echocardiogram Performed. 

## 2014-04-23 ENCOUNTER — Inpatient Hospital Stay (HOSPITAL_COMMUNITY)
Admission: EM | Admit: 2014-04-23 | Discharge: 2014-04-29 | DRG: 470 | Disposition: A | Discharge status: Skilled Nursing Facility | Payer: Medicare Other | Attending: Internal Medicine | Admitting: Internal Medicine

## 2014-04-23 ENCOUNTER — Encounter (HOSPITAL_COMMUNITY): Payer: Self-pay | Admitting: Internal Medicine

## 2014-04-23 ENCOUNTER — Emergency Department (HOSPITAL_COMMUNITY): Payer: Medicare Other

## 2014-04-23 DIAGNOSIS — Z882 Allergy status to sulfonamides status: Secondary | ICD-10-CM

## 2014-04-23 DIAGNOSIS — I4891 Unspecified atrial fibrillation: Secondary | ICD-10-CM | POA: Diagnosis present

## 2014-04-23 DIAGNOSIS — E785 Hyperlipidemia, unspecified: Secondary | ICD-10-CM | POA: Diagnosis present

## 2014-04-23 DIAGNOSIS — K219 Gastro-esophageal reflux disease without esophagitis: Secondary | ICD-10-CM | POA: Diagnosis present

## 2014-04-23 DIAGNOSIS — W01198A Fall on same level from slipping, tripping and stumbling with subsequent striking against other object, initial encounter: Secondary | ICD-10-CM | POA: Diagnosis present

## 2014-04-23 DIAGNOSIS — Z7901 Long term (current) use of anticoagulants: Secondary | ICD-10-CM | POA: Diagnosis not present

## 2014-04-23 DIAGNOSIS — K4090 Unilateral inguinal hernia, without obstruction or gangrene, not specified as recurrent: Secondary | ICD-10-CM | POA: Diagnosis present

## 2014-04-23 DIAGNOSIS — Z886 Allergy status to analgesic agent status: Secondary | ICD-10-CM

## 2014-04-23 DIAGNOSIS — Z8261 Family history of arthritis: Secondary | ICD-10-CM

## 2014-04-23 DIAGNOSIS — K5900 Constipation, unspecified: Secondary | ICD-10-CM | POA: Diagnosis not present

## 2014-04-23 DIAGNOSIS — E876 Hypokalemia: Secondary | ICD-10-CM | POA: Diagnosis not present

## 2014-04-23 DIAGNOSIS — Z885 Allergy status to narcotic agent status: Secondary | ICD-10-CM

## 2014-04-23 DIAGNOSIS — E039 Hypothyroidism, unspecified: Secondary | ICD-10-CM | POA: Diagnosis present

## 2014-04-23 DIAGNOSIS — M80051A Age-related osteoporosis with current pathological fracture, right femur, initial encounter for fracture: Principal | ICD-10-CM | POA: Diagnosis present

## 2014-04-23 DIAGNOSIS — F0390 Unspecified dementia without behavioral disturbance: Secondary | ICD-10-CM | POA: Diagnosis present

## 2014-04-23 DIAGNOSIS — M810 Age-related osteoporosis without current pathological fracture: Secondary | ICD-10-CM | POA: Diagnosis present

## 2014-04-23 DIAGNOSIS — Y92010 Kitchen of single-family (private) house as the place of occurrence of the external cause: Secondary | ICD-10-CM | POA: Diagnosis not present

## 2014-04-23 DIAGNOSIS — D62 Acute posthemorrhagic anemia: Secondary | ICD-10-CM | POA: Diagnosis not present

## 2014-04-23 DIAGNOSIS — Z79899 Other long term (current) drug therapy: Secondary | ICD-10-CM | POA: Diagnosis not present

## 2014-04-23 DIAGNOSIS — M25551 Pain in right hip: Secondary | ICD-10-CM | POA: Diagnosis present

## 2014-04-23 DIAGNOSIS — I10 Essential (primary) hypertension: Secondary | ICD-10-CM | POA: Diagnosis present

## 2014-04-23 DIAGNOSIS — S72009A Fracture of unspecified part of neck of unspecified femur, initial encounter for closed fracture: Secondary | ICD-10-CM | POA: Diagnosis present

## 2014-04-23 DIAGNOSIS — Z9889 Other specified postprocedural states: Secondary | ICD-10-CM

## 2014-04-23 DIAGNOSIS — S72001A Fracture of unspecified part of neck of right femur, initial encounter for closed fracture: Secondary | ICD-10-CM

## 2014-04-23 DIAGNOSIS — M80059A Age-related osteoporosis with current pathological fracture, unspecified femur, initial encounter for fracture: Secondary | ICD-10-CM | POA: Diagnosis present

## 2014-04-23 DIAGNOSIS — Z8249 Family history of ischemic heart disease and other diseases of the circulatory system: Secondary | ICD-10-CM | POA: Diagnosis not present

## 2014-04-23 DIAGNOSIS — S72001S Fracture of unspecified part of neck of right femur, sequela: Secondary | ICD-10-CM

## 2014-04-23 LAB — COMPREHENSIVE METABOLIC PANEL
ALT: 21 U/L (ref 0–35)
AST: 40 U/L — ABNORMAL HIGH (ref 0–37)
Albumin: 4.2 g/dL (ref 3.5–5.2)
Alkaline Phosphatase: 65 U/L (ref 39–117)
Anion gap: 7 (ref 5–15)
BILIRUBIN TOTAL: 1.4 mg/dL — AB (ref 0.3–1.2)
BUN: 15 mg/dL (ref 6–23)
CHLORIDE: 105 mmol/L (ref 96–112)
CO2: 27 mmol/L (ref 19–32)
CREATININE: 0.92 mg/dL (ref 0.50–1.10)
Calcium: 9.1 mg/dL (ref 8.4–10.5)
GFR calc Af Amer: 62 mL/min — ABNORMAL LOW (ref >90)
GFR, EST NON AFRICAN AMERICAN: 54 mL/min — AB (ref >90)
Glucose, Bld: 183 mg/dL — ABNORMAL HIGH (ref 70–99)
Potassium: 4.2 mmol/L (ref 3.5–5.1)
SODIUM: 139 mmol/L (ref 135–145)
Total Protein: 8.2 g/dL (ref 6.0–8.3)

## 2014-04-23 LAB — CBC
HEMATOCRIT: 46.1 % — AB (ref 36.0–46.0)
HEMOGLOBIN: 15.1 g/dL — AB (ref 12.0–15.0)
MCH: 31 pg (ref 26.0–34.0)
MCHC: 32.8 g/dL (ref 30.0–36.0)
MCV: 94.7 fL (ref 78.0–100.0)
Platelets: 136 10*3/uL — ABNORMAL LOW (ref 150–400)
RBC: 4.87 MIL/uL (ref 3.87–5.11)
RDW: 13.4 % (ref 11.5–15.5)
WBC: 12.9 10*3/uL — ABNORMAL HIGH (ref 4.0–10.5)

## 2014-04-23 LAB — PROTIME-INR
INR: 3.37 — ABNORMAL HIGH (ref 0.00–1.49)
Prothrombin Time: 34.4 seconds — ABNORMAL HIGH (ref 11.6–15.2)

## 2014-04-23 LAB — I-STAT TROPONIN, ED: TROPONIN I, POC: 0.03 ng/mL (ref 0.00–0.08)

## 2014-04-23 MED ORDER — ACETAMINOPHEN 650 MG RE SUPP: 650.0000 mg | Freq: Four times a day (QID) | RECTAL | Status: DC | PRN

## 2014-04-23 MED ORDER — METOPROLOL TARTRATE 25 MG PO TABS: 25.0000 mg | ORAL_TABLET | Freq: Two times a day (BID) | ORAL | Status: DC

## 2014-04-23 MED ORDER — FENTANYL CITRATE 0.05 MG/ML IJ SOLN
25.0000 ug | Freq: Once | INTRAMUSCULAR | Status: AC
  Administered 2014-04-23: 25 ug via INTRAVENOUS
  Filled 2014-04-23: qty 2

## 2014-04-23 MED ORDER — ONDANSETRON HCL 4 MG PO TABS
4.0000 mg | ORAL_TABLET | Freq: Four times a day (QID) | ORAL | Status: DC | PRN
  Administered 2014-04-26 – 2014-04-29 (×7): 4 mg via ORAL
  Filled 2014-04-23 (×7): qty 1

## 2014-04-23 MED ORDER — FENTANYL CITRATE 0.05 MG/ML IJ SOLN
12.5000 ug | INTRAMUSCULAR | Status: DC | PRN
  Administered 2014-04-23 – 2014-04-26 (×8): 12.5 ug via INTRAVENOUS
  Filled 2014-04-23 (×8): qty 2

## 2014-04-23 MED ORDER — SODIUM CHLORIDE 0.9 % IJ SOLN
3.0000 mL | Freq: Two times a day (BID) | INTRAMUSCULAR | Status: DC
  Administered 2014-04-23 – 2014-04-28 (×5): 3 mL via INTRAVENOUS

## 2014-04-23 MED ORDER — SODIUM CHLORIDE 0.9 % IJ SOLN
3.0000 mL | INTRAMUSCULAR | Status: DC | PRN
  Administered 2014-04-23 – 2014-04-28 (×2): 3 mL via INTRAVENOUS
  Filled 2014-04-23: qty 3

## 2014-04-23 MED ORDER — LEVOTHYROXINE SODIUM 75 MCG PO TABS
75.0000 ug | ORAL_TABLET | Freq: Every day | ORAL | Status: DC
  Administered 2014-04-23 – 2014-04-29 (×7): 75 ug via ORAL
  Filled 2014-04-23 (×9): qty 1

## 2014-04-23 MED ORDER — SODIUM CHLORIDE 0.9 % IV SOLN: 250.0000 mL | INTRAVENOUS | Status: DC | PRN

## 2014-04-23 MED ORDER — ONDANSETRON HCL 4 MG/2ML IJ SOLN
4.0000 mg | Freq: Four times a day (QID) | INTRAMUSCULAR | Status: DC | PRN
  Administered 2014-04-23 – 2014-04-26 (×9): 4 mg via INTRAVENOUS
  Filled 2014-04-23 (×10): qty 2

## 2014-04-23 MED ORDER — PRAVASTATIN SODIUM 40 MG PO TABS
40.0000 mg | ORAL_TABLET | Freq: Every day | ORAL | Status: DC
  Administered 2014-04-23 – 2014-04-29 (×6): 40 mg via ORAL
  Filled 2014-04-23 (×7): qty 1

## 2014-04-23 MED ORDER — METOPROLOL TARTRATE 25 MG PO TABS
25.0000 mg | ORAL_TABLET | Freq: Two times a day (BID) | ORAL | Status: DC
  Administered 2014-04-23 – 2014-04-29 (×12): 25 mg via ORAL
  Filled 2014-04-23 (×13): qty 1

## 2014-04-23 MED ORDER — SODIUM CHLORIDE 0.9 % IV SOLN
INTRAVENOUS | Status: DC
  Administered 2014-04-23 – 2014-04-25 (×2): via INTRAVENOUS

## 2014-04-23 MED ORDER — DOCUSATE SODIUM 100 MG PO CAPS
100.0000 mg | ORAL_CAPSULE | Freq: Two times a day (BID) | ORAL | Status: DC
Start: 2014-04-23 — End: 2014-04-29
  Administered 2014-04-23 – 2014-04-29 (×12): 100 mg via ORAL
  Filled 2014-04-23 (×12): qty 1

## 2014-04-23 MED ORDER — ACETAMINOPHEN 325 MG PO TABS
650.0000 mg | ORAL_TABLET | Freq: Four times a day (QID) | ORAL | Status: DC | PRN
  Administered 2014-04-23 – 2014-04-29 (×10): 650 mg via ORAL
  Filled 2014-04-23 (×10): qty 2

## 2014-04-23 MED ORDER — VITAMIN K1 10 MG/ML IJ SOLN
10.0000 mg | Freq: Once | INTRAMUSCULAR | Status: AC
  Administered 2014-04-23: 10 mg via INTRAVENOUS
  Filled 2014-04-23: qty 1

## 2014-04-23 NOTE — ED Notes (Signed)
Bed: [id number] Expected date:  Expected time:  Means of arrival:  Comments: EMS- 81yo F, fall, hip pain

## 2014-04-23 NOTE — ED Provider Notes (Signed)
CSN: [id number]     Arrival date & time 04/23/14  [id number] History   First MD Initiated Contact with Patient 04/23/14 [phone]     Chief Complaint  Patient presents with  . Hip Pain     (Consider location/radiation/quality/duration/timing/severity/associated sxs/prior Treatment) HPI Comments: Tripped on linoleum in the kitchen last night. Larey SeatFell and landed on her R hip. No head injury or loss of consciousness.  Patient is a 81 y.o. female presenting with hip pain. The history is provided by the patient.  Hip Pain This is a new problem. The current episode started yesterday. The problem occurs constantly. The problem has not changed since onset.Pertinent negatives include no shortness of breath. Nothing aggravates the symptoms. Nothing relieves the symptoms.    Past Medical History  Diagnosis Date  . Atrial fibrillation   . Chest pain   . HLD (hyperlipidemia)   . GERD (gastroesophageal reflux disease)   . Back pain   . Osteoarthritis     knee  . Hypothyroidism   . Osteoporosis   . PUD (peptic ulcer disease)     hx of upper GI bleeding   . Arthritis    Past Surgical History  Procedure Laterality Date  . Thyroid surgery    . Ulcer surgery     Family History  Problem Relation Age of Onset  . Kidney cancer      family hx  . Coronary artery disease      family hx  . Arthritis      family hx   History  Substance Use Topics  . Smoking status: Never Smoker   . Smokeless tobacco: Not on file     Comment: tobacco use - no  . Alcohol Use: No   OB History    No data available     Review of Systems  Constitutional: Negative for fever.  Respiratory: Negative for cough and shortness of breath.   All other systems reviewed and are negative.     Allergies  Aspirin; Codeine; and Sulfonamide derivatives  Home Medications   Prior to Admission medications   Medication Sig Start Date End Date Taking? Authorizing Provider  Calcium Carbonate-Vitamin D (CALCIUM 600+D) 600-400  MG-UNIT per tablet Take 1 tablet by mouth 2 (two) times daily.    Historical Provider, MD  folic acid (FOLVITE) 400 MCG tablet Take 400 mcg by mouth daily.      Historical Provider, MD  levothyroxine (SYNTHROID, LEVOTHROID) 75 MCG tablet Take 75 mcg by mouth daily.      Historical Provider, MD  LORazepam (ATIVAN) 0.5 MG tablet Take 0.25-0.5 mg by mouth at bedtime as needed.      Historical Provider, MD  metoprolol tartrate (LOPRESSOR) 25 MG tablet TAKE ONE TABLET BY MOUTH TWICE DAILY-MUST BE SEEN 06/14/13   Lars MassonKatarina H Nelson, MD  Misc Natural Products (TURMERIC CURCUMIN) CAPS Take 1 capsule by mouth daily.      Historical Provider, MD  Multiple Vitamins-Minerals (CENTRUM PO) Take 1 tablet by mouth daily.      Historical Provider, MD  Omega-3 Fatty Acids (FISH OIL) 1200 MG CAPS Take by mouth 2 (two) times daily.    Historical Provider, MD  pravastatin (PRAVACHOL) 40 MG tablet Take 40 mg by mouth 2 (two) times daily.     Historical Provider, MD  vitamin D, CHOLECALCIFEROL, 400 UNITS tablet Take 400 Units by mouth daily.     Historical Provider, MD  warfarin (COUMADIN) 2.5 MG tablet Take as directed by anticoagulation clinic 02/29/12  Gaylord Shih, MD   BP 160/88 mmHg  Pulse 69  Temp(Src) 97.4 F (36.3 C) (Oral)  Resp 16  Ht  (1.676 m)  Wt 125 lb (56.7 kg)  BMI 20.19 kg/m2 Physical Exam  Constitutional: She is oriented to person, place, and time. She appears well-developed and well-nourished. No distress.  HENT:  Head: Normocephalic and atraumatic.  Mouth/Throat: Oropharynx is clear and moist.  Eyes: EOM are normal. Pupils are equal, round, and reactive to light.  Neck: Normal range of motion. Neck supple.  Cardiovascular: Normal rate and regular rhythm.  Exam reveals no friction rub.   No murmur heard. Pulmonary/Chest: Effort normal and breath sounds normal. No respiratory distress. She has no wheezes. She has no rales.  Abdominal: Soft. She exhibits no distension. There is no  tenderness. There is no rebound.  Musculoskeletal: Normal range of motion. She exhibits no edema.  R leg shortened and externally rotated  Neurological: She is alert and oriented to person, place, and time. No cranial nerve deficit. She exhibits normal muscle tone. Coordination normal.  Skin: Skin is warm. No rash noted. She is not diaphoretic.  Nursing note and vitals reviewed.   ED Course  Procedures (including critical care time) Labs Review Labs Reviewed  CBC  COMPREHENSIVE METABOLIC PANEL  PROTIME-INR  Rosezena Sensor, ED    Imaging Review Dg Chest 1 View  04/23/2014   CLINICAL DATA:  81 year old female who fell last night with hip fracture. Preoperative study. Initial encounter.  EXAM: CHEST  1 VIEW  COMPARISON:  08/02/2009.  FINDINGS: Portable AP semi upright view at 1018 hours. Stable cardiomegaly and mediastinal contours. Visualized tracheal air column is within normal limits. Stable lung volumes. Allowing for portable technique, the lungs are clear. No acute osseous abnormality identified.  IMPRESSION: Stable cardiomegaly. No acute cardiopulmonary abnormality.   Electronically Signed   By: Odessa Fleming M.D.   On: 04/23/2014 10:34   Dg Hip Unilat With Pelvis 2-3 Views Right  04/23/2014   CLINICAL DATA:  81 year old female who fell last night with severe right hip pain. Initial encounter.  EXAM: RIGHT HIP (WITH PELVIS) 2-3 VIEWS  COMPARISON:  None.  FINDINGS: Right femoral neck fracture risk varus impaction. Right femoral head remains normally located.  No superimposed acute pelvis fracture identified. Osteopenia. Grossly intact proximal left femur. Partially visible advanced lumbar spine degeneration.  IMPRESSION: Acute right femoral neck fracture with varus impaction.   Electronically Signed   By: Odessa Fleming M.D.   On: 04/23/2014 10:33     EKG Interpretation   Date/Time:  Tuesday April 23 2014 09:55:20 EST Ventricular Rate:  87 PR Interval:    QRS Duration: 89 QT Interval:   410 QTC Calculation: 493 R Axis:   -16 Text Interpretation:  Atrial fibrillation Borderline left axis deviation  Anteroseptal infarct, age indeterminate No significant change since last  tracing Confirmed by [facility]  MD, Michaiah Maiden [phone]) on 04/23/2014 10:08:08 AM      MDM   Final diagnoses:  Right hip pain  Femoral neck fracture, right, closed, initial encounter    81 year old female with history of A. fib on Coumadin, high blood pressure presents with right hip pain. She fell in the kitchen last night. She has been nonambulatory since. On exam she has stable vitals, her right leg is shortened and externally rotated. It is neurovascular intact. Concern for hip fracture. Lower preop screening labs and x-rays and give pain medicine.  Talked to Dr. Eulah Pont with Ortho, goal INR  1.7. Vitamin K given here.  Dr. Butler Denmark with [facility].    Elwin Mocha, MD 04/23/14 1600

## 2014-04-23 NOTE — ED Notes (Signed)
Pt given 8mg  of Zofran IV and 50mcg of Fentanyl prior to arrival by EMS.

## 2014-04-23 NOTE — H&P (Signed)
[facility] History and Physical  Amanda [name] ZOX:[id number] DOB: [birth year] DOA: 04/23/2014   PCP: Herb Grays, MD    Chief Complaint: fall and right hip pain  HPI: Amanda [name] is a 80 y.o. female with A-fib, HTN, HLP comes in after tripping and falling. She has right hip pain on movement and is found to have a right femoral neck fracture. The history is mostly obtained from her son who is at bedside. The patient remains mostly quiet.  She is being admitted for surgery. She is on Coumadin for A-fib and INR is elevated today which is preventing surgery today. She has received Vit K IV. The orthopedic surgeon would like to be transferred to [facility] for the surgery. The patient has no other complaints. No h/o chest pain or dyspnea on exertion. No h/o palpitations.    General: The patient denies anorexia, fever, weight loss Cardiac: Denies chest pain, syncope, palpitations, pedal edema  Respiratory: Denies cough, shortness of breath, wheezing GI: Denies severe indigestion/heartburn, abdominal pain, nausea, vomiting, diarrhea and constipation GU: Denies hematuria, incontinence, dysuria  Musculoskeletal: Denies arthritis  Skin: Denies suspicious skin lesions Neurologic: Denies focal weakness or numbness, change in vision Psychiatry: Denies depression or anxiety. When specifically asked about dementia, the patient's son states that she does have issues with mild memory deficits.  Hematologic: no easy bruising or bleeding   Past Medical History  Diagnosis Date  . Atrial fibrillation   . HLD (hyperlipidemia)   . GERD (gastroesophageal reflux disease)   . Back pain   . Osteoarthritis     knee  . Hypothyroidism   . Osteoporosis   . PUD (peptic ulcer disease)     hx of upper GI bleeding   . Arthritis     Surgical history - none  Social History: does not smoke or drink Lives at home with husband and son    Allergies  Allergen Reactions  . Aspirin Diarrhea    REACTION:  black tarry stools  . Nsaids Other (See Comments)    Patient has ulcer   . Codeine Other (See Comments)    REACTION: GI upset  . Sulfonamide Derivatives Rash    Family History  Problem Relation Age of Onset  . Kidney cancer      family hx  . Coronary artery disease      family hx  . Arthritis      family hx     Prior to Admission medications   Medication Sig Start Date End Date Taking? Authorizing Provider  acetaminophen (TYLENOL) 500 MG tablet Take 1,000 mg by mouth every 4 (four) hours as needed for mild pain or headache.   Yes Historical Provider, MD  Calcium Carbonate-Vitamin D (CALCIUM 600+D) 600-400 MG-UNIT per tablet Take 1 tablet by mouth 2 (two) times daily.   Yes Historical Provider, MD  Cholecalciferol (VITAMIN D3) 5000 UNITS TABS Take 1 tablet by mouth every Monday, Wednesday, and Friday.   Yes Historical Provider, MD  Cranberry 500 MG CAPS Take 1 capsule by mouth daily.   Yes Historical Provider, MD  folic acid (FOLVITE) 400 MCG tablet Take 400 mcg by mouth daily.     Yes Historical Provider, MD  levothyroxine (SYNTHROID, LEVOTHROID) 75 MCG tablet Take 75 mcg by mouth daily.     Yes Historical Provider, MD  metoprolol tartrate (LOPRESSOR) 25 MG tablet TAKE ONE TABLET BY MOUTH TWICE DAILY-MUST BE SEEN Patient taking differently: TAKE ONE TABLET BY MOUTH TWICE DAILY 06/14/13  Yes Faustino Congress  Delton See, MD  Misc Natural Products (TURMERIC CURCUMIN) CAPS Take 1 capsule by mouth daily.     Yes Historical Provider, MD  Multiple Vitamins-Minerals (CENTRUM PO) Take 1 tablet by mouth daily.     Yes Historical Provider, MD  Omega-3 Fatty Acids (FISH OIL) 1200 MG CAPS Take 1 capsule by mouth 2 (two) times daily.    Yes Historical Provider, MD  pravastatin (PRAVACHOL) 40 MG tablet Take 40 mg by mouth daily.    Yes Historical Provider, MD  vitamin B-12 (CYANOCOBALAMIN) 500 MCG tablet Take 500 mcg by mouth daily.   Yes Historical Provider, MD  warfarin (COUMADIN) 5 MG tablet Take 2.5-5 mg  by mouth daily. Takes  on Monday, Wednesday, and Friday Takes 2.5mg  on Tuesday, Thursday, Saturday, and Sunday   Yes Historical Provider, MD  warfarin (COUMADIN) 2.5 MG tablet Take as directed by anticoagulation clinic Patient not taking: Reported on 04/23/2014 02/29/12   Gaylord Shih, MD     Physical Exam: Filed Vitals:   04/23/14 0942  BP: 160/88  Pulse: 69  Temp: 97.4 F (36.3 C)  TempSrc: Oral  Resp: 16  Height:  (1.676 m)  Weight: 56.7 kg (125 lb)     General: AAO x 3  HEENT: Normocephalic and Atraumatic, Mucous membranes pink                PERRLA; EOM intact; No scleral icterus,                 Nares: Patent, Oropharynx: Clear, Fair Dentition                 Neck: FROM, no cervical lymphadenopathy, thyromegaly, carotid bruit or JVD;  Breasts: deferred CHEST WALL: No tenderness  CHEST: Normal respiration, clear to auscultation bilaterally  HEART: IIRR; no murmurs rubs or gallops  BACK: No kyphosis or scoliosis; no CVA tenderness  GI: Positive Bowel Sounds, soft, non-tender; no masses, no organomegaly Rectal Exam: deferred MSK: No cyanosis, clubbing, or edema Genitalia: not examined  SKIN:  no rash or ulceration  CNS: Alert and Oriented x 4, Nonfocal exam, CN 2-12 intact  Labs on Admission:  Basic Metabolic Panel:  Recent Labs Lab 04/23/14 1002  NA 139  K 4.2  CL 105  CO2 27  GLUCOSE 183*  BUN 15  CREATININE 0.92  CALCIUM 9.1   Liver Function Tests:  Recent Labs Lab 04/23/14 1002  AST 40*  ALT 21  ALKPHOS 65  BILITOT 1.4*  PROT 8.2  ALBUMIN 4.2   No results for input(s): LIPASE, AMYLASE in the last 168 hours. No results for input(s): AMMONIA in the last 168 hours. CBC:  Recent Labs Lab 04/23/14 1002  WBC 12.9*  HGB 15.1*  HCT 46.1*  MCV 94.7  PLT 136*   Cardiac Enzymes: No results for input(s): CKTOTAL, CKMB, CKMBINDEX, TROPONINI in the last 168 hours.  BNP (last 3 results) No results for input(s): BNP in the last 8760  hours.  ProBNP (last 3 results) No results for input(s): PROBNP in the last 8760 hours.  CBG: No results for input(s): GLUCAP in the last 168 hours.  Radiological Exams on Admission: Dg Chest 1 View  04/23/2014   CLINICAL DATA:  81 year old female who fell last night with hip fracture. Preoperative study. Initial encounter.  EXAM: CHEST  1 VIEW  COMPARISON:  08/02/2009.  FINDINGS: Portable AP semi upright view at 1018 hours. Stable cardiomegaly and mediastinal contours. Visualized tracheal air column is within normal limits. Stable  lung volumes. Allowing for portable technique, the lungs are clear. No acute osseous abnormality identified.  IMPRESSION: Stable cardiomegaly. No acute cardiopulmonary abnormality.   Electronically Signed   By: Odessa FlemingH  Hall M.D.   On: 04/23/2014 10:34   Dg Hip Unilat With Pelvis 2-3 Views Right  04/23/2014   CLINICAL DATA:  81 year old female who fell last night with severe right hip pain. Initial encounter.  EXAM: RIGHT HIP (WITH PELVIS) 2-3 VIEWS  COMPARISON:  None.  FINDINGS: Right femoral neck fracture risk varus impaction. Right femoral head remains normally located.  No superimposed acute pelvis fracture identified. Osteopenia. Grossly intact proximal left femur. Partially visible advanced lumbar spine degeneration.  IMPRESSION: Acute right femoral neck fracture with varus impaction.   Electronically Signed   By: Odessa FlemingH  Hall M.D.   On: 04/23/2014 10:33    EKG: Independently reviewed. Rate controlled A-fib.   Assessment/Plan Principal Problem:   Femoral neck fracture - management per ortho  Active Problems:   Essential hypertension - cont Metoprolol    Atrial fibrillation - rated controled with Metoprolol - hold Coumadin- has received 10 U Vit K- INR needs to be 1.7 or below per ortho for surgery    Osteoporosis -cont Vit D and Calcium as outpt  Dementia - per son, patient has mild memory deficits but nothing more severe than that    Consulted: ortho- Dr  Eulah PontMurphy (ER Doc spoke with him)  Code Status: Full code  Family Communication: son and husband  DVT Prophylaxis:INR elevated  Time spent: 50 min  Taylan Mayhan, MD [facility]  If 7PM-7AM, please contact night-coverage [email or web address] 04/23/2014, 11:37 AM

## 2014-04-23 NOTE — ED Notes (Signed)
Family at bedside. 

## 2014-04-23 NOTE — ED Notes (Signed)
Pt arrived via EMS c/o right hip pain from fall last night.

## 2014-04-23 NOTE — ED Notes (Signed)
This Consulting civil engineerCharge RN updated Pt and family regarding the admission delay.  All parties verbalized understanding.

## 2014-04-23 NOTE — Consult Note (Signed)
I have reviewed her x-rays and tentative plan is as follows:  [facility] on Thursday 2/18 pending INR  Goal INR is 1.7.    I will see the patient and her family today/tomorrow at [facility] and do a formal consult then.   Margarita RanaMURPHY, Fedra Lanter, D Cell: [phone]

## 2014-04-23 NOTE — ED Notes (Signed)
Pts daughter requesting maintenance IV fluids for pt. Amanda Denmarkizwan, MD notified.

## 2014-04-23 NOTE — ED Notes (Signed)
Called Marissa charge nurse to confirm time of arrival...klj

## 2014-04-24 ENCOUNTER — Encounter (HOSPITAL_COMMUNITY): Payer: Self-pay | Admitting: General Surgery

## 2014-04-24 LAB — BASIC METABOLIC PANEL
Anion gap: 3 — ABNORMAL LOW (ref 5–15)
BUN: 19 mg/dL (ref 6–23)
CALCIUM: 8.5 mg/dL (ref 8.4–10.5)
CO2: 29 mmol/L (ref 19–32)
Chloride: 105 mmol/L (ref 96–112)
Creatinine, Ser: 1.15 mg/dL — ABNORMAL HIGH (ref 0.50–1.10)
GFR calc Af Amer: 47 mL/min — ABNORMAL LOW (ref >90)
GFR calc non Af Amer: 41 mL/min — ABNORMAL LOW (ref >90)
Glucose, Bld: 112 mg/dL — ABNORMAL HIGH (ref 70–99)
Potassium: 3.8 mmol/L (ref 3.5–5.1)
SODIUM: 137 mmol/L (ref 135–145)

## 2014-04-24 LAB — CBC
HCT: 40.4 % (ref 36.0–46.0)
Hemoglobin: 13.3 g/dL (ref 12.0–15.0)
MCH: 30.6 pg (ref 26.0–34.0)
MCHC: 32.9 g/dL (ref 30.0–36.0)
MCV: 92.9 fL (ref 78.0–100.0)
PLATELETS: 138 10*3/uL — AB (ref 150–400)
RBC: 4.35 MIL/uL (ref 3.87–5.11)
RDW: 13.7 % (ref 11.5–15.5)
WBC: 11.8 10*3/uL — ABNORMAL HIGH (ref 4.0–10.5)

## 2014-04-24 LAB — PROTIME-INR
INR: 1.42 (ref 0.00–1.49)
PROTHROMBIN TIME: 17.5 s — AB (ref 11.6–15.2)

## 2014-04-24 MED ORDER — SIMETHICONE 80 MG PO CHEW
80.0000 mg | CHEWABLE_TABLET | Freq: Four times a day (QID) | ORAL | Status: DC | PRN
  Administered 2014-04-25 – 2014-04-28 (×5): 80 mg via ORAL
  Filled 2014-04-24 (×8): qty 1

## 2014-04-24 MED ORDER — ACETAMINOPHEN 500 MG PO TABS: 1000.0000 mg | ORAL_TABLET | Freq: Once | ORAL | Status: DC

## 2014-04-24 MED ORDER — ACETAMINOPHEN 500 MG PO TABS
1000.0000 mg | ORAL_TABLET | ORAL | Status: AC
Start: 2014-04-25 — End: 2014-04-26

## 2014-04-24 MED ORDER — CEFAZOLIN SODIUM-DEXTROSE 2-3 GM-% IV SOLR
2.0000 g | INTRAVENOUS | Status: AC
  Administered 2014-04-25: 2 g via INTRAVENOUS
  Filled 2014-04-24 (×2): qty 50

## 2014-04-24 MED ORDER — DEXTROSE-NACL 5-0.45 % IV SOLN: 100.0000 mL/h | INTRAVENOUS | Status: DC

## 2014-04-24 NOTE — Progress Notes (Signed)
[facility] PROGRESS NOTE  Amanda [name] ZOX:[id number] DOB: 1922-11-11 DOA: 04/23/2014 PCP: Amanda Grays, MD  Assessment/Plan:  Principal Problem:   Femoral neck fracture: medically optimized for surgery Active Problems:   Essential hypertension   Atrial fibrillation: coumadin reversed.   Osteoporosis  Code Status:  full Family Communication:  Daughter at bedside Disposition Plan:  Likely SNF  Consultants:  orthopedics  Procedures:     Antibiotics:    HPI/Subjective: C/o pain. No CP, dyspnea, nausea  Objective: Filed Vitals:   04/24/14 0519  BP: 144/69  Pulse: 73  Temp: 97.9 F (36.6 C)  Resp: 16    Intake/Output Summary (Last 24 hours) at 04/24/14 1240 Last data filed at 04/24/14 0500  Gross per 24 hour  Intake 721.67 ml  Output      0 ml  Net 721.67 ml   Filed Weights   04/23/14 0942  Weight: 56.7 kg (125 lb)    Exam:   General:  A and o, comfortable  Cardiovascular: irreg irreg  Respiratory: CTA without WRR  Abdomen: S, NT, ND  Ext: no CCE  Basic Metabolic Panel:  Recent Labs Lab 04/23/14 1002 04/24/14 0740  NA 139 137  K 4.2 3.8  CL 105 105  CO2 27 29  GLUCOSE 183* 112*  BUN 15 19  CREATININE 0.92 1.15*  CALCIUM 9.1 8.5   Liver Function Tests:  Recent Labs Lab 04/23/14 1002  AST 40*  ALT 21  ALKPHOS 65  BILITOT 1.4*  PROT 8.2  ALBUMIN 4.2   No results for input(s): LIPASE, AMYLASE in the last 168 hours. No results for input(s): AMMONIA in the last 168 hours. CBC:  Recent Labs Lab 04/23/14 1002 04/24/14 0740  WBC 12.9* 11.8*  HGB 15.1* 13.3  HCT 46.1* 40.4  MCV 94.7 92.9  PLT 136* 138*   Cardiac Enzymes: No results for input(s): CKTOTAL, CKMB, CKMBINDEX, TROPONINI in the last 168 hours. BNP (last 3 results) No results for input(s): BNP in the last 8760 hours.  ProBNP (last 3 results) No results for input(s): PROBNP in the last 8760 hours.  CBG: No results for input(s): GLUCAP in the last  168 hours.  No results found for this or any previous visit (from the past 240 hour(s)).   Studies: Dg Chest 1 View  04/23/2014   CLINICAL DATA:  81 year old female who fell last night with hip fracture. Preoperative study. Initial encounter.  EXAM: CHEST  1 VIEW  COMPARISON:  08/02/2009.  FINDINGS: Portable AP semi upright view at 1018 hours. Stable cardiomegaly and mediastinal contours. Visualized tracheal air column is within normal limits. Stable lung volumes. Allowing for portable technique, the lungs are clear. No acute osseous abnormality identified.  IMPRESSION: Stable cardiomegaly. No acute cardiopulmonary abnormality.   Electronically Signed   By: Odessa Fleming M.D.   On: 04/23/2014 10:34   Dg Hip Unilat With Pelvis 2-3 Views Right  04/23/2014   CLINICAL DATA:  81 year old female who fell last night with severe right hip pain. Initial encounter.  EXAM: RIGHT HIP (WITH PELVIS) 2-3 VIEWS  COMPARISON:  None.  FINDINGS: Right femoral neck fracture risk varus impaction. Right femoral head remains normally located.  No superimposed acute pelvis fracture identified. Osteopenia. Grossly intact proximal left femur. Partially visible advanced lumbar spine degeneration.  IMPRESSION: Acute right femoral neck fracture with varus impaction.   Electronically Signed   By: Odessa Fleming M.D.   On: 04/23/2014 10:33    Scheduled Meds: . [START ON 04/25/2014] acetaminophen  1,000 mg Oral On Call to OR  . [START ON 04/25/2014]  ceFAZolin (ANCEF) IV  2 g Intravenous On Call to OR  . docusate sodium  100 mg Oral BID  . levothyroxine  75 mcg Oral QAC breakfast  . metoprolol tartrate  25 mg Oral BID  . pravastatin  40 mg Oral Daily  . sodium chloride  3 mL Intravenous Q12H   Continuous Infusions: . sodium chloride 50 mL/hr at 04/23/14 1658  . dextrose 5 % and 0.45% NaCl      Time spent: 35 minutes  [name] L  [facility] [email or web address], password [email or web address] 04/24/2014, 12:40 PM  LOS: 1 day

## 2014-04-24 NOTE — Consult Note (Signed)
Reason for Consult: right inguinal hernia  Referring Physician: Dr. Doree Barthel  Amanda [name] is an 81 y.o. female.  HPI: she has a past medical history of Afib and HTN admitted for a right hip fracture following a fall.  She is scheduled for surgery tomorrow with Dr. Percell Miller.  Her coumadin has been placed on hold. INR is 1.4 today.  We have been asked to evaluate the patient for an incarcerated right inguinal hernia.  The patient reports symptoms for "several months" now.  She has been able to push it back in place, have normal BMs and has not caused her pain.  She developed pain to this region following her fall and apparently was unable to push it back in place.  She continues to have flatus.  Her symptoms are mild in severity.  No aggravating or alleviating factors.  No modifying factors. No imaging has been done. She is tolerating POs.    Past Medical History  Diagnosis Date  . Atrial fibrillation   . HLD (hyperlipidemia)   . GERD (gastroesophageal reflux disease)   . Back pain   . Osteoarthritis     knee  . Hypothyroidism   . Osteoporosis   . PUD (peptic ulcer disease)     hx of upper GI bleeding   . Arthritis     Past Surgical History  Procedure Laterality Date  . 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 Kidney cancer      family hx  . Coronary artery disease      family hx  . Arthritis      family hx    Social History:  reports that she has never smoked. She has never used smokeless tobacco. She reports that she does not drink alcohol or use illicit drugs.  Allergies:  Allergies  Allergen Reactions  . Aspirin Diarrhea    REACTION: black tarry stools  . Nsaids Other (See Comments)    Patient has ulcer   . Codeine Other (See Comments)    REACTION: GI upset  . Sulfonamide Derivatives Rash    Medications:  Scheduled Meds: . docusate sodium  100 mg Oral BID  . levothyroxine  75 mcg Oral QAC breakfast  .  metoprolol tartrate  25 mg Oral BID  . pravastatin  40 mg Oral Daily  . sodium chloride  3 mL Intravenous Q12H   Continuous Infusions: . sodium chloride 50 mL/hr at 04/23/14 1658   PRN Meds:.sodium chloride, acetaminophen **OR** acetaminophen, fentaNYL, ondansetron **OR** ondansetron (ZOFRAN) IV, sodium chloride   Results for orders placed or performed during the hospital encounter of 04/23/14 (from the past 48 hour(s))  CBC     Status: Abnormal   Collection Time: 04/23/14 10:02 AM  Result Value Ref Range   WBC 12.9 (H) 4.0 - 10.5 K/uL   RBC 4.87 3.87 - 5.11 MIL/uL   Hemoglobin 15.1 (H) 12.0 - 15.0 g/dL   HCT 46.1 (H) 36.0 - 46.0 %   MCV 94.7 78.0 - 100.0 fL   MCH 31.0 26.0 - 34.0 pg   MCHC 32.8 30.0 - 36.0 g/dL   RDW 13.4 11.5 - 15.5 %   Platelets 136 (L) 150 - 400 K/uL  Comprehensive metabolic panel     Status: Abnormal   Collection Time: 04/23/14 10:02 AM  Result Value Ref Range   Sodium 139 135 - 145 mmol/L   Potassium 4.2 3.5 - 5.1  mmol/L   Chloride 105 96 - 112 mmol/L   CO2 27 19 - 32 mmol/L   Glucose, Bld 183 (H) 70 - 99 mg/dL   BUN 15 6 - 23 mg/dL   Creatinine, Ser 0.92 0.50 - 1.10 mg/dL   Calcium 9.1 8.4 - 10.5 mg/dL   Total Protein 8.2 6.0 - 8.3 g/dL   Albumin 4.2 3.5 - 5.2 g/dL   AST 40 (H) 0 - 37 U/L   ALT 21 0 - 35 U/L   Alkaline Phosphatase 65 39 - 117 U/L   Total Bilirubin 1.4 (H) 0.3 - 1.2 mg/dL   GFR calc non Af Amer 54 (L) >90 mL/min   GFR calc Af Amer 62 (L) >90 mL/min    Comment: (NOTE) The eGFR has been calculated using the CKD EPI equation. This calculation has not been validated in all clinical situations. eGFR's persistently <90 mL/min signify possible Chronic Kidney Disease.    Anion gap 7 5 - 15  Protime-INR     Status: Abnormal   Collection Time: 04/23/14 10:02 AM  Result Value Ref Range   Prothrombin Time 34.4 (H) 11.6 - 15.2 seconds   INR 3.37 (H) 0.00 - 1.49  I-stat troponin, ED     Status: None   Collection Time: 04/23/14 10:11 AM   Result Value Ref Range   Troponin i, poc 0.03 0.00 - 0.08 ng/mL   Comment 3            Comment: Due to the release kinetics of cTnI, a negative result within the first hours of the onset of symptoms does not rule out myocardial infarction with certainty. If myocardial infarction is still suspected, repeat the test at appropriate intervals.   Basic metabolic panel     Status: Abnormal   Collection Time: 04/24/14  7:40 AM  Result Value Ref Range   Sodium 137 135 - 145 mmol/L   Potassium 3.8 3.5 - 5.1 mmol/L   Chloride 105 96 - 112 mmol/L   CO2 29 19 - 32 mmol/L   Glucose, Bld 112 (H) 70 - 99 mg/dL   BUN 19 6 - 23 mg/dL   Creatinine, Ser 1.15 (H) 0.50 - 1.10 mg/dL   Calcium 8.5 8.4 - 10.5 mg/dL   GFR calc non Af Amer 41 (L) >90 mL/min   GFR calc Af Amer 47 (L) >90 mL/min    Comment: (NOTE) The eGFR has been calculated using the CKD EPI equation. This calculation has not been validated in all clinical situations. eGFR's persistently <90 mL/min signify possible Chronic Kidney Disease.    Anion gap 3 (L) 5 - 15  Protime-INR     Status: Abnormal   Collection Time: 04/24/14  7:40 AM  Result Value Ref Range   Prothrombin Time 17.5 (H) 11.6 - 15.2 seconds   INR 1.42 0.00 - 1.49  CBC     Status: Abnormal   Collection Time: 04/24/14  7:40 AM  Result Value Ref Range   WBC 11.8 (H) 4.0 - 10.5 K/uL   RBC 4.35 3.87 - 5.11 MIL/uL   Hemoglobin 13.3 12.0 - 15.0 g/dL   HCT 40.4 36.0 - 46.0 %   MCV 92.9 78.0 - 100.0 fL   MCH 30.6 26.0 - 34.0 pg   MCHC 32.9 30.0 - 36.0 g/dL   RDW 13.7 11.5 - 15.5 %   Platelets 138 (L) 150 - 400 K/uL    Dg Chest 1 View  04/23/2014   CLINICAL DATA:  81 year old  female who fell last night with hip fracture. Preoperative study. Initial encounter.  EXAM: CHEST  1 VIEW  COMPARISON:  08/02/2009.  FINDINGS: Portable AP semi upright view at 1018 hours. Stable cardiomegaly and mediastinal contours. Visualized tracheal air column is within normal limits. Stable  lung volumes. Allowing for portable technique, the lungs are clear. No acute osseous abnormality identified.  IMPRESSION: Stable cardiomegaly. No acute cardiopulmonary abnormality.   Electronically Signed   By: Genevie Ann M.D.   On: 04/23/2014 10:34   Dg Hip Unilat With Pelvis 2-3 Views Right  04/23/2014   CLINICAL DATA:  81 year old female who fell last night with severe right hip pain. Initial encounter.  EXAM: RIGHT HIP (WITH PELVIS) 2-3 VIEWS  COMPARISON:  None.  FINDINGS: Right femoral neck fracture risk varus impaction. Right femoral head remains normally located.  No superimposed acute pelvis fracture identified. Osteopenia. Grossly intact proximal left femur. Partially visible advanced lumbar spine degeneration.  IMPRESSION: Acute right femoral neck fracture with varus impaction.   Electronically Signed   By: Genevie Ann M.D.   On: 04/23/2014 10:33    Review of Systems  All other systems reviewed and are negative.  Blood pressure 144/69, pulse 73, temperature 97.9 F (36.6 C), temperature source Oral, resp. rate 16, height 5' 6"  (1.676 m), weight 125 lb (56.7 kg), SpO2 96 %. Physical Exam  Constitutional: She appears well-developed and well-nourished. No distress.  Cardiovascular: Normal heart sounds and intact distal pulses.  Exam reveals no gallop and no friction rub.   No murmur heard. s1s2 irregularly irregular  Respiratory: Effort normal and breath sounds normal. No respiratory distress. She has no wheezes. She has no rales. She exhibits no tenderness.  GI: Soft. Bowel sounds are normal. She exhibits no distension. There is no tenderness. There is no rebound and no guarding.  Right inguinal hernia, easily reducible, non tender, no erythema.   Skin: She is not diaphoretic.    Assessment/Plan: Atrial fibrillation Chronic anticoagulation  Right femoral neck  Fracture  Easily reducible right inguinal hernia-no surgical intervention needed at this time.  Will discuss with Dr. Grandville Silos  whether to follow up as an outpatient with [place] for an interval hernia repair.  May feed the patient and proceed surgery tomorrow from surgery standpoint.  Will sign off.  Please call [facility] with questions or concerns.   Brealynn Contino ANP-BC 04/24/2014, 10:06 AM

## 2014-04-24 NOTE — Consult Note (Signed)
   ORTHOPAEDIC CONSULTATION  REQUESTING PHYSICIAN: Corinna L Sullivan, MD  Chief Complaint: Right hip fracture  HPI: Amanda [name] is a 81 y.o. female who suffered a mechanical fall onto her R side  Past Medical History  Diagnosis Date  . Atrial fibrillation   . HLD (hyperlipidemia)   . GERD (gastroesophageal reflux disease)   . Back pain   . Osteoarthritis     knee  . Hypothyroidism   . Osteoporosis   . PUD (peptic ulcer disease)     hx of upper GI bleeding   . Arthritis    Past Surgical History  Procedure Laterality Date  . 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 Marital Status: Married    Spouse Name: N/A  . Number of Children: N/A  . Years of Education: N/A   Social History Main Topics  . Smoking status: Never Smoker   . Smokeless tobacco: Never Used     Comment: tobacco use - no  . Alcohol Use: No  . Drug Use: No  . Sexual Activity: No   Other Topics Concern  . None   Social History Narrative   Family History  Problem Relation Age of Onset  . Kidney cancer      family hx  . Coronary artery disease      family hx  . Arthritis      family hx   Allergies  Allergen Reactions  . Aspirin Diarrhea    REACTION: black tarry stools  . Nsaids Other (See Comments)    Patient has ulcer   . Codeine Other (See Comments)    REACTION: GI upset  . Sulfonamide Derivatives Rash   Prior to Admission medications   Medication Sig Start Date End Date Taking? Authorizing Provider  acetaminophen (TYLENOL) 500 MG tablet Take 1,000 mg by mouth every 4 (four) hours as needed for mild pain or headache.   Yes Historical Provider, MD  Calcium Carbonate-Vitamin D (CALCIUM 600+D) 600-400 MG-UNIT per tablet Take 1 tablet by mouth 2 (two) times daily.   Yes Historical Provider, MD  Cholecalciferol (VITAMIN D3) 5000 UNITS TABS Take 1 tablet by mouth every Monday, Wednesday, and Friday.   Yes Historical Provider, MD  Cranberry 500 MG CAPS Take  1 capsule by mouth daily.   Yes Historical Provider, MD  folic acid (FOLVITE) 400 MCG tablet Take 400 mcg by mouth daily.     Yes Historical Provider, MD  levothyroxine (SYNTHROID, LEVOTHROID) 75 MCG tablet Take 75 mcg by mouth daily.     Yes Historical Provider, MD  metoprolol tartrate (LOPRESSOR) 25 MG tablet TAKE ONE TABLET BY MOUTH TWICE DAILY-MUST BE SEEN Patient taking differently: TAKE ONE TABLET BY MOUTH TWICE DAILY 06/14/13  Yes Katarina H Nelson, MD  Misc Natural Products (TURMERIC CURCUMIN) CAPS Take 1 capsule by mouth daily.     Yes Historical Provider, MD  Multiple Vitamins-Minerals (CENTRUM PO) Take 1 tablet by mouth daily.     Yes Historical Provider, MD  Omega-3 Fatty Acids (FISH OIL) 1200 MG CAPS Take 1 capsule by mouth 2 (two) times daily.    Yes Historical Provider, MD  pravastatin (PRAVACHOL) 40 MG tablet Take 40 mg by mouth daily.    Yes Historical Provider, MD  vitamin B-12 (CYANOCOBALAMIN) 500 MCG tablet Take 500 mcg by mouth daily.   Yes Historical Provider, MD  warfarin (COUMADIN) 5 MG tablet Take 2.5-5 mg by mouth daily. Takes 5mg   on Monday, Wednesday, and Friday Takes 2.5mg on Tuesday, Thursday, Saturday, and Sunday   Yes Historical Provider, MD  warfarin (COUMADIN) 2.5 MG tablet Take as directed by anticoagulation clinic Patient not taking: Reported on 04/23/2014 02/29/12   Mcnease C Wall, MD   Dg Chest 1 View  04/23/2014   CLINICAL DATA:  81-year-old female who fell last night with hip fracture. Preoperative study. Initial encounter.  EXAM: CHEST  1 VIEW  COMPARISON:  08/02/2009.  FINDINGS: Portable AP semi upright view at 1018 hours. Stable cardiomegaly and mediastinal contours. Visualized tracheal air column is within normal limits. Stable lung volumes. Allowing for portable technique, the lungs are clear. No acute osseous abnormality identified.  IMPRESSION: Stable cardiomegaly. No acute cardiopulmonary abnormality.   Electronically Signed   By: H  Hall M.D.   On:  04/23/2014 10:34   Dg Hip Unilat With Pelvis 2-3 Views Right  04/23/2014   CLINICAL DATA:  81-year-old female who fell last night with severe right hip pain. Initial encounter.  EXAM: RIGHT HIP (WITH PELVIS) 2-3 VIEWS  COMPARISON:  None.  FINDINGS: Right femoral neck fracture risk varus impaction. Right femoral head remains normally located.  No superimposed acute pelvis fracture identified. Osteopenia. Grossly intact proximal left femur. Partially visible advanced lumbar spine degeneration.  IMPRESSION: Acute right femoral neck fracture with varus impaction.   Electronically Signed   By: H  Hall M.D.   On: 04/23/2014 10:33    Positive ROS: All other systems have been reviewed and were otherwise negative with the exception of those mentioned in the HPI and as above.  Labs cbc  Recent Labs  04/23/14 1002  WBC 12.9*  HGB 15.1*  HCT 46.1*  PLT 136*    Labs inflam No results for input(s): CRP in the last 72 hours.  Invalid input(s): ESR  Labs coag  Recent Labs  04/23/14 1002  INR 3.37*     Recent Labs  04/23/14 1002  NA 139  K 4.2  CL 105  CO2 27  GLUCOSE 183*  BUN 15  CREATININE 0.92  CALCIUM 9.1    Physical Exam: Filed Vitals:   04/24/14 0519  BP: 144/69  Pulse: 73  Temp: 97.9 F (36.6 C)  Resp: 16   General: Alert, no acute distress Cardiovascular: No pedal edema Respiratory: No cyanosis, no use of accessory musculature GI: No organomegaly, abdomen is soft and non-tender Skin: No lesions in the area of chief complaint other than those listed below in MSK exam.  Neurologic: Sensation intact distally Psychiatric: Patient is competent for consent with normal mood and affect Lymphatic: No axillary or cervical lymphadenopathy  MUSCULOSKELETAL:  RLE: pain with log roll, skin benign, distally NVI Other extremities are atraumatic with painless ROM and NVI.  Assessment: R fem neck fracture  Plan: Will need R hip Hemi, we discussed risks and benefits with  patient and her daughter, they would like to attempt spinal anesthesia Plan for OR on 2/18 Thursday for hemi pending INR. Goal INR is 1.7  Weight Bearing Status: bedrest for now, WBAT post op PT VTE px: SCD's and holding for now, OK to restart post op   MURPHY, TIMOTHY, D, MD Cell [phone]   04/24/2014 8:11 AM     

## 2014-04-24 NOTE — Progress Notes (Signed)
Utilization review completed. Kajuan Guyton, RN, BSN. 

## 2014-04-25 ENCOUNTER — Inpatient Hospital Stay (HOSPITAL_COMMUNITY): Payer: Medicare Other

## 2014-04-25 ENCOUNTER — Encounter (HOSPITAL_COMMUNITY): Payer: Self-pay | Admitting: Certified Registered Nurse Anesthetist

## 2014-04-25 ENCOUNTER — Inpatient Hospital Stay (HOSPITAL_COMMUNITY): Payer: Medicare Other | Admitting: Certified Registered Nurse Anesthetist

## 2014-04-25 ENCOUNTER — Encounter (HOSPITAL_COMMUNITY)
Admission: EM | Disposition: A | Discharge status: Skilled Nursing Facility | Payer: Self-pay | Source: Home / Self Care | Attending: Internal Medicine

## 2014-04-25 HISTORY — PX: HIP ARTHROPLASTY: SHX981

## 2014-04-25 LAB — SURGICAL PCR SCREEN
MRSA, PCR: NEGATIVE
Staphylococcus aureus: POSITIVE — AB

## 2014-04-25 SURGERY — HEMIARTHROPLASTY, HIP, DIRECT ANTERIOR APPROACH, FOR FRACTURE
Anesthesia: General | Site: Hip | Laterality: Right

## 2014-04-25 MED ORDER — LACTATED RINGERS IV SOLN
INTRAVENOUS | Status: DC | PRN
  Administered 2014-04-25 (×2): via INTRAVENOUS

## 2014-04-25 MED ORDER — ROCURONIUM BROMIDE 100 MG/10ML IV SOLN
INTRAVENOUS | Status: DC | PRN
  Administered 2014-04-25: 25 mg via INTRAVENOUS

## 2014-04-25 MED ORDER — METOCLOPRAMIDE HCL 5 MG/ML IJ SOLN
5.0000 mg | Freq: Three times a day (TID) | INTRAMUSCULAR | Status: DC | PRN
  Administered 2014-04-26: 10 mg via INTRAVENOUS
  Filled 2014-04-25: qty 2

## 2014-04-25 MED ORDER — MENTHOL 3 MG MT LOZG: 1.0000 | LOZENGE | OROMUCOSAL | Status: DC | PRN

## 2014-04-25 MED ORDER — MORPHINE SULFATE 2 MG/ML IJ SOLN: 0.5000 mg | INTRAMUSCULAR | Status: DC | PRN

## 2014-04-25 MED ORDER — PROPOFOL 10 MG/ML IV BOLUS
INTRAVENOUS | Status: AC
  Filled 2014-04-25: qty 20

## 2014-04-25 MED ORDER — WARFARIN - PHARMACIST DOSING INPATIENT: Freq: Every day | Status: DC

## 2014-04-25 MED ORDER — PROPOFOL 10 MG/ML IV BOLUS
INTRAVENOUS | Status: DC | PRN
  Administered 2014-04-25: 80 mg via INTRAVENOUS

## 2014-04-25 MED ORDER — DOCUSATE SODIUM 100 MG PO CAPS: 100.0000 mg | ORAL_CAPSULE | Freq: Two times a day (BID) | ORAL | Status: DC

## 2014-04-25 MED ORDER — MUPIROCIN 2 % EX OINT
1.0000 "application " | TOPICAL_OINTMENT | Freq: Two times a day (BID) | CUTANEOUS | Status: DC
  Administered 2014-04-25 – 2014-04-29 (×8): 1 via NASAL
  Filled 2014-04-25: qty 22

## 2014-04-25 MED ORDER — METOCLOPRAMIDE HCL 10 MG PO TABS: 5.0000 mg | ORAL_TABLET | Freq: Three times a day (TID) | ORAL | Status: DC | PRN

## 2014-04-25 MED ORDER — HYDROCODONE-ACETAMINOPHEN 5-325 MG PO TABS
1.0000 | ORAL_TABLET | Freq: Four times a day (QID) | ORAL | Status: DC | PRN
  Administered 2014-04-26: 1 via ORAL
  Administered 2014-04-26: 2 via ORAL
  Administered 2014-04-27: 1 via ORAL
  Administered 2014-04-27: 2 via ORAL
  Administered 2014-04-28: 1 via ORAL
  Administered 2014-04-28: 2 via ORAL
  Administered 2014-04-28: 1 via ORAL
  Filled 2014-04-25: qty 1
  Filled 2014-04-25: qty 2
  Filled 2014-04-25: qty 1
  Filled 2014-04-25: qty 2
  Filled 2014-04-25 (×2): qty 1
  Filled 2014-04-25: qty 2

## 2014-04-25 MED ORDER — ONDANSETRON HCL 4 MG PO TABS: 4.0000 mg | ORAL_TABLET | Freq: Three times a day (TID) | ORAL | Status: DC | PRN

## 2014-04-25 MED ORDER — HYDROCODONE-ACETAMINOPHEN 5-325 MG PO TABS: 1.0000 | ORAL_TABLET | Freq: Four times a day (QID) | ORAL | Status: DC | PRN

## 2014-04-25 MED ORDER — LIDOCAINE HCL (CARDIAC) 20 MG/ML IV SOLN
INTRAVENOUS | Status: DC | PRN
  Administered 2014-04-25: 40 mg via INTRAVENOUS

## 2014-04-25 MED ORDER — NEOSTIGMINE METHYLSULFATE 10 MG/10ML IV SOLN
INTRAVENOUS | Status: DC | PRN
  Administered 2014-04-25: 3 mg via INTRAVENOUS

## 2014-04-25 MED ORDER — WARFARIN SODIUM 5 MG PO TABS
2.5000 mg | ORAL_TABLET | Freq: Once | ORAL | Status: AC
  Administered 2014-04-25: 2.5 mg via ORAL
  Filled 2014-04-25: qty 0.5

## 2014-04-25 MED ORDER — ONDANSETRON HCL 4 MG/2ML IJ SOLN
INTRAMUSCULAR | Status: DC | PRN
Start: 2014-04-25 — End: 2014-04-25
  Administered 2014-04-25: 4 mg via INTRAVENOUS

## 2014-04-25 MED ORDER — 0.9 % SODIUM CHLORIDE (POUR BTL) OPTIME
TOPICAL | Status: DC | PRN
  Administered 2014-04-25: 1000 mL

## 2014-04-25 MED ORDER — FENTANYL CITRATE 0.05 MG/ML IJ SOLN
INTRAMUSCULAR | Status: DC | PRN
Start: 2014-04-25 — End: 2014-04-25
  Administered 2014-04-25 (×3): 50 ug via INTRAVENOUS

## 2014-04-25 MED ORDER — CEFAZOLIN SODIUM-DEXTROSE 2-3 GM-% IV SOLR
INTRAVENOUS | Status: DC | PRN
  Administered 2014-04-25: 2 g via INTRAVENOUS

## 2014-04-25 MED ORDER — CHLORHEXIDINE GLUCONATE CLOTH 2 % EX PADS
6.0000 | MEDICATED_PAD | Freq: Every day | CUTANEOUS | Status: AC
  Administered 2014-04-25 – 2014-04-29 (×5): 6 via TOPICAL

## 2014-04-25 MED ORDER — PHENYLEPHRINE HCL 10 MG/ML IJ SOLN
INTRAMUSCULAR | Status: DC | PRN
  Administered 2014-04-25 (×3): 80 ug via INTRAVENOUS
  Administered 2014-04-25: 40 ug via INTRAVENOUS
  Administered 2014-04-25 (×2): 80 ug via INTRAVENOUS
  Administered 2014-04-25: 120 ug via INTRAVENOUS
  Administered 2014-04-25: 80 ug via INTRAVENOUS

## 2014-04-25 MED ORDER — FENTANYL CITRATE 0.05 MG/ML IJ SOLN
INTRAMUSCULAR | Status: AC
  Filled 2014-04-25: qty 5

## 2014-04-25 MED ORDER — LACTATED RINGERS IV SOLN
INTRAVENOUS | Status: DC
  Administered 2014-04-25: 10:00:00 via INTRAVENOUS

## 2014-04-25 MED ORDER — LIDOCAINE HCL (CARDIAC) 20 MG/ML IV SOLN
INTRAVENOUS | Status: AC
  Filled 2014-04-25: qty 5

## 2014-04-25 MED ORDER — CEFAZOLIN SODIUM-DEXTROSE 2-3 GM-% IV SOLR
2.0000 g | Freq: Four times a day (QID) | INTRAVENOUS | Status: AC
  Administered 2014-04-25 (×2): 2 g via INTRAVENOUS
  Filled 2014-04-25 (×2): qty 50

## 2014-04-25 MED ORDER — GLYCOPYRROLATE 0.2 MG/ML IJ SOLN
INTRAMUSCULAR | Status: DC | PRN
  Administered 2014-04-25: .4 mg via INTRAVENOUS

## 2014-04-25 MED ORDER — PHENOL 1.4 % MT LIQD: 1.0000 | OROMUCOSAL | Status: DC | PRN

## 2014-04-25 MED ORDER — ONDANSETRON HCL 4 MG/2ML IJ SOLN
INTRAMUSCULAR | Status: AC
  Filled 2014-04-25: qty 2

## 2014-04-25 MED ORDER — ROCURONIUM BROMIDE 50 MG/5ML IV SOLN
INTRAVENOUS | Status: AC
  Filled 2014-04-25: qty 1

## 2014-04-25 SURGICAL SUPPLY — 48 items
BIT DRILL 7/64X5 DISP (BIT) ×3 IMPLANT
BLADE SAGITTAL 25.0X1.27X90 (BLADE) ×2 IMPLANT
BLADE SAGITTAL 25.0X1.27X90MM (BLADE) ×1
CAPT HIP HEMI 2 ×3 IMPLANT
CLOSURE STERI-STRIP 1/2X4 (GAUZE/BANDAGES/DRESSINGS) ×1
CLSR STERI-STRIP ANTIMIC 1/2X4 (GAUZE/BANDAGES/DRESSINGS) ×2 IMPLANT
COVER SURGICAL LIGHT HANDLE (MISCELLANEOUS) ×3 IMPLANT
DRAPE IMP U-DRAPE 54X76 (DRAPES) ×3 IMPLANT
DRAPE ORTHO SPLIT 77X108 STRL (DRAPES) ×4
DRAPE SURG ORHT 6 SPLT 77X108 (DRAPES) ×2 IMPLANT
DRAPE U-SHAPE 47X51 STRL (DRAPES) ×3 IMPLANT
DRSG AQUACEL AG ADV 3.5X10 (GAUZE/BANDAGES/DRESSINGS) IMPLANT
DRSG MEPILEX BORDER 4X8 (GAUZE/BANDAGES/DRESSINGS) ×3 IMPLANT
DURAPREP 26ML APPLICATOR (WOUND CARE) ×3 IMPLANT
ELECT CAUTERY BLADE 6.4 (BLADE) IMPLANT
ELECT REM PT RETURN 9FT ADLT (ELECTROSURGICAL) ×3
ELECTRODE REM PT RTRN 9FT ADLT (ELECTROSURGICAL) ×1 IMPLANT
FACESHIELD WRAPAROUND (MASK) ×3 IMPLANT
GLOVE BIO SURGEON STRL SZ7.5 (GLOVE) IMPLANT
GLOVE BIOGEL PI IND STRL 8 (GLOVE) ×1 IMPLANT
GLOVE BIOGEL PI INDICATOR 8 (GLOVE) ×2
GOWN STRL REUS W/ TWL LRG LVL3 (GOWN DISPOSABLE) ×4 IMPLANT
GOWN STRL REUS W/ TWL XL LVL3 (GOWN DISPOSABLE) ×2 IMPLANT
GOWN STRL REUS W/TWL LRG LVL3 (GOWN DISPOSABLE) ×8
GOWN STRL REUS W/TWL XL LVL3 (GOWN DISPOSABLE) ×4
HIP CAPITATED HEMI 2 ×1 IMPLANT
KIT BASIN OR (CUSTOM PROCEDURE TRAY) ×3 IMPLANT
KIT ROOM TURNOVER OR (KITS) ×3 IMPLANT
MANIFOLD NEPTUNE II (INSTRUMENTS) ×3 IMPLANT
NS IRRIG 1000ML POUR BTL (IV SOLUTION) ×3 IMPLANT
PACK TOTAL JOINT (CUSTOM PROCEDURE TRAY) ×3 IMPLANT
PACK UNIVERSAL I (CUSTOM PROCEDURE TRAY) ×3 IMPLANT
PAD ARMBOARD 7.5X6 YLW CONV (MISCELLANEOUS) ×6 IMPLANT
PILLOW ABDUCTION HIP (SOFTGOODS) ×3 IMPLANT
RETRIEVER SUT HEWSON (MISCELLANEOUS) ×3 IMPLANT
SUT FIBERWIRE #2 38 REV NDL BL (SUTURE) ×6
SUT FIBERWIRE #2 38 T-5 BLUE (SUTURE) ×3
SUT MNCRL AB 4-0 PS2 18 (SUTURE) ×3 IMPLANT
SUT MON AB 2-0 CT1 36 (SUTURE) ×6 IMPLANT
SUT VIC AB 0 CT1 27 (SUTURE) ×2
SUT VIC AB 0 CT1 27XBRD ANBCTR (SUTURE) ×1 IMPLANT
SUTURE FIBERWR #2 38 T-5 BLUE (SUTURE) ×1 IMPLANT
SUTURE FIBERWR#2 38 REV NDL BL (SUTURE) ×2 IMPLANT
TOWEL OR 17X24 6PK STRL BLUE (TOWEL DISPOSABLE) ×3 IMPLANT
TOWEL OR 17X26 10 PK STRL BLUE (TOWEL DISPOSABLE) ×3 IMPLANT
TOWEL OR NON WOVEN STRL DISP B (DISPOSABLE) IMPLANT
TRAY FOLEY CATH 14FR (SET/KITS/TRAYS/PACK) IMPLANT
WATER STERILE IRR 1000ML POUR (IV SOLUTION) IMPLANT

## 2014-04-25 NOTE — Progress Notes (Signed)
Patient ID: Amanda [name], female   DOB: December 03, 1922, 81 y.o.   MRN: [id number]     [address] [place], [place] [address]    Phone: [phone] FAX: [phone]     Subjective: Some pain, which is likely referred from his hip fracture.  Objective:  Vital signs:  Filed Vitals:   04/24/14 0519 04/24/14 1400 04/24/14 2023 04/25/14 0554  BP: 144/69 144/72 135/70 108/89  Pulse: 73 59 64 59  Temp: 97.9 F (36.6 C) 97.8 F (36.6 C) 97.6 F (36.4 C) 98 F (36.7 C)  TempSrc:   Oral Oral  Resp: 16 16  15   Height:      Weight:      SpO2: 96% 99% 97% 97%    Last BM Date: 04/22/14  Intake/Output   Yesterday:  02/17 0701 - 02/18 0700 In: 5364 [P.O.:420; I.V.:800] Out: -  This shift:     Physical Exam: General: Pt awake/alert/oriented x4 in no acute distress Abdomen: Soft.  Nondistended.  Non tender. No evidence of peritonitis.  No incarcerated hernias.  Easily reducible right inguinal hernia    Problem List:   Principal Problem:   Femoral neck fracture Active Problems:   Essential hypertension   Atrial fibrillation   Osteoporosis   Hip fracture due to osteoporosis    Results:   Labs: Results for orders placed or performed during the hospital encounter of 04/23/14 (from the past 48 hour(s))  CBC     Status: Abnormal   Collection Time: 04/23/14 10:02 AM  Result Value Ref Range   WBC 12.9 (H) 4.0 - 10.5 K/uL   RBC 4.87 3.87 - 5.11 MIL/uL   Hemoglobin 15.1 (H) 12.0 - 15.0 g/dL   HCT 46.1 (H) 36.0 - 46.0 %   MCV 94.7 78.0 - 100.0 fL   MCH 31.0 26.0 - 34.0 pg   MCHC 32.8 30.0 - 36.0 g/dL   RDW 13.4 11.5 - 15.5 %   Platelets 136 (L) 150 - 400 K/uL  Comprehensive metabolic panel     Status: Abnormal   Collection Time: 04/23/14 10:02 AM  Result Value Ref Range   Sodium 139 135 - 145 mmol/L   Potassium 4.2 3.5 - 5.1 mmol/L   Chloride 105 96 - 112 mmol/L   CO2 27 19 - 32 mmol/L   Glucose, Bld 183  (H) 70 - 99 mg/dL   BUN 15 6 - 23 mg/dL   Creatinine, Ser 0.92 0.50 - 1.10 mg/dL   Calcium 9.1 8.4 - 10.5 mg/dL   Total Protein 8.2 6.0 - 8.3 g/dL   Albumin 4.2 3.5 - 5.2 g/dL   AST 40 (H) 0 - 37 U/L   ALT 21 0 - 35 U/L   Alkaline Phosphatase 65 39 - 117 U/L   Total Bilirubin 1.4 (H) 0.3 - 1.2 mg/dL   GFR calc non Af Amer 54 (L) >90 mL/min   GFR calc Af Amer 62 (L) >90 mL/min    Comment: (NOTE) The eGFR has been calculated using the CKD EPI equation. This calculation has not been validated in all clinical situations. eGFR's persistently <90 mL/min signify possible Chronic Kidney Disease.    Anion gap 7 5 - 15  Protime-INR     Status: Abnormal   Collection Time: 04/23/14 10:02 AM  Result Value Ref Range   Prothrombin Time 34.4 (H) 11.6 - 15.2 seconds   INR 3.37 (H) 0.00 - 1.49  I-stat troponin, ED     Status: None   Collection Time: 04/23/14 10:11 AM  Result Value Ref Range   Troponin i, poc 0.03 0.00 - 0.08 ng/mL   Comment 3            Comment: Due to the release kinetics of cTnI, a negative result within the first hours of the onset of symptoms does not rule out myocardial infarction with certainty. If myocardial infarction is still suspected, repeat the test at appropriate intervals.   Basic metabolic panel     Status: Abnormal   Collection Time: 04/24/14  7:40 AM  Result Value Ref Range   Sodium 137 135 - 145 mmol/L   Potassium 3.8 3.5 - 5.1 mmol/L   Chloride 105 96 - 112 mmol/L   CO2 29 19 - 32 mmol/L   Glucose, Bld 112 (H) 70 - 99 mg/dL   BUN 19 6 - 23 mg/dL   Creatinine, Ser 1.15 (H) 0.50 - 1.10 mg/dL   Calcium 8.5 8.4 - 10.5 mg/dL   GFR calc non Af Amer 41 (L) >90 mL/min   GFR calc Af Amer 47 (L) >90 mL/min    Comment: (NOTE) The eGFR has been calculated using the CKD EPI equation. This calculation has not been validated in all clinical situations. eGFR's persistently <90 mL/min signify possible Chronic Kidney Disease.    Anion gap 3 (L) 5 - 15   Protime-INR     Status: Abnormal   Collection Time: 04/24/14  7:40 AM  Result Value Ref Range   Prothrombin Time 17.5 (H) 11.6 - 15.2 seconds   INR 1.42 0.00 - 1.49  CBC     Status: Abnormal   Collection Time: 04/24/14  7:40 AM  Result Value Ref Range   WBC 11.8 (H) 4.0 - 10.5 K/uL   RBC 4.35 3.87 - 5.11 MIL/uL   Hemoglobin 13.3 12.0 - 15.0 g/dL   HCT 40.4 36.0 - 46.0 %   MCV 92.9 78.0 - 100.0 fL   MCH 30.6 26.0 - 34.0 pg   MCHC 32.9 30.0 - 36.0 g/dL   RDW 13.7 11.5 - 15.5 %   Platelets 138 (L) 150 - 400 K/uL  Surgical pcr screen     Status: Abnormal   Collection Time: 04/24/14  9:13 PM  Result Value Ref Range   MRSA, PCR NEGATIVE NEGATIVE   Staphylococcus aureus POSITIVE (A) NEGATIVE    Comment:        The Xpert SA Assay (FDA approved for NASAL specimens in patients over 15 years of age), is one component of a comprehensive surveillance program.  Test performance has been validated by [facility] for patients greater than or equal to 79 year old. It is not intended to diagnose infection nor to guide or monitor treatment. RESULT CALLED TO, READ BACK BY AND VERIFIED WITH: Urbano Heir RN [phone] [id number] GREEN R     Imaging / Studies: Dg Chest 1 View  04/23/2014   CLINICAL DATA:  81 year old female who fell last night with hip fracture. Preoperative study. Initial encounter.  EXAM: CHEST  1 VIEW  COMPARISON:  08/02/2009.  FINDINGS: Portable AP semi upright view at 1018 hours. Stable cardiomegaly and mediastinal contours. Visualized tracheal air column is within normal limits. Stable lung volumes. Allowing for portable technique, the lungs are clear. No acute osseous abnormality identified.  IMPRESSION: Stable cardiomegaly. No acute cardiopulmonary abnormality.   Electronically Signed   By: Genevie Ann M.D.   On: 04/23/2014 10:34  Dg Hip Unilat With Pelvis 2-3 Views Right  04/23/2014   CLINICAL DATA:  81 year old female who fell last night with severe right hip pain. Initial  encounter.  EXAM: RIGHT HIP (WITH PELVIS) 2-3 VIEWS  COMPARISON:  None.  FINDINGS: Right femoral neck fracture risk varus impaction. Right femoral head remains normally located.  No superimposed acute pelvis fracture identified. Osteopenia. Grossly intact proximal left femur. Partially visible advanced lumbar spine degeneration.  IMPRESSION: Acute right femoral neck fracture with varus impaction.   Electronically Signed   By: Genevie Ann M.D.   On: 04/23/2014 10:33    Medications / Allergies:  Scheduled Meds: . acetaminophen  1,000 mg Oral On Call to OR  .  ceFAZolin (ANCEF) IV  2 g Intravenous On Call to OR  . docusate sodium  100 mg Oral BID  . levothyroxine  75 mcg Oral QAC breakfast  . metoprolol tartrate  25 mg Oral BID  . pravastatin  40 mg Oral Daily  . sodium chloride  3 mL Intravenous Q12H   Continuous Infusions: . sodium chloride 50 mL/hr at 04/23/14 1658  . dextrose 5 % and 0.45% NaCl     PRN Meds:.sodium chloride, acetaminophen **OR** acetaminophen, fentaNYL, ondansetron **OR** ondansetron (ZOFRAN) IV, simethicone, sodium chloride  Antibiotics: Anti-infectives    Start     Dose/Rate Route Frequency Ordered Stop   04/25/14 0600  ceFAZolin (ANCEF) IVPB 2 g/50 mL premix     2 g 100 mL/hr over 30 Minutes Intravenous On call to O.R. 04/24/14 1025 04/26/14 0559        Assessment/Plan Right inguinal hernia-easily reducible.  May follow up as an outpatient with Dr. Grandville Silos for further discussion regarding possible surgery.  Erby Pian, [facility] Surgery Pager [phone]) For consults and floor pages call [phone]([id number])  04/25/2014 9:12 AM

## 2014-04-25 NOTE — Anesthesia Procedure Notes (Signed)
Procedure Name: Intubation Date/Time: 04/25/2014 11:59 AM Performed by: Dairl PonderJIANG, Garnette Greb Pre-anesthesia Checklist: Patient identified, Timeout performed, Emergency Drugs available, Suction available and Patient being monitored Patient Re-evaluated:Patient Re-evaluated prior to inductionOxygen Delivery Method: Circle system utilized Preoxygenation: Pre-oxygenation with 100% oxygen Intubation Type: IV induction Ventilation: Mask ventilation without difficulty Laryngoscope Size: Mac and 3 Grade View: Grade I Tube type: Oral Tube size: 7.0 mm Number of attempts: 1 Placement Confirmation: ETT inserted through vocal cords under direct vision,  breath sounds checked- equal and bilateral and positive ETCO2 Secured at: 22 cm Tube secured with: Tape Dental Injury: Teeth and Oropharynx as per pre-operative assessment

## 2014-04-25 NOTE — Interval H&P Note (Signed)
History and Physical Interval Note:  04/25/2014 11:18 AM  Amanda [name]  has presented today for surgery, with the diagnosis of right hip fracture  The various methods of treatment have been discussed with the patient and family. After consideration of risks, benefits and other options for treatment, the patient has consented to  Procedure(s): ARTHROPLASTY BIPOLAR HIP (Right) as a surgical intervention .  The patient's history has been reviewed, patient examined, no change in status, stable for surgery.  I have reviewed the patient's chart and labs.  Questions were answered to the patient's satisfaction.     [name], D

## 2014-04-25 NOTE — Progress Notes (Signed)
ANTICOAGULATION CONSULT NOTE - Initial Consult  Pharmacy Consult for Warfarin Indication: VTE prophylaxis  Allergies  Allergen Reactions  . Aspirin Diarrhea    REACTION: black tarry stools  . Nsaids Other (See Comments)    Patient has ulcer   . Codeine Other (See Comments)    REACTION: GI upset  . Sulfonamide Derivatives Rash    Patient Measurements: Height:  (167.6 cm) Weight: 125 lb (56.7 kg) IBW/kg (Calculated) : 59.3  Vital Signs: Temp: 98.1 F (36.7 C) (02/18 1611) Temp Source: Oral (02/18 0938) BP: 158/87 mmHg (02/18 1611) Pulse Rate: 70 (02/18 1500)  Labs:  Recent Labs  04/23/14 1002 04/24/14 0740  HGB 15.1* 13.3  HCT 46.1* 40.4  PLT 136* 138*  LABPROT 34.4* 17.5*  INR 3.37* 1.42  CREATININE 0.92 1.15*    Estimated Creatinine Clearance: 29.7 mL/min (by C-G formula based on Cr of 1.15).   Medical History: Past Medical History  Diagnosis Date  . Atrial fibrillation   . HLD (hyperlipidemia)   . GERD (gastroesophageal reflux disease)   . Back pain   . Osteoarthritis     knee  . Hypothyroidism   . Osteoporosis   . PUD (peptic ulcer disease)     hx of upper GI bleeding   . Arthritis     Medications:  Prescriptions prior to admission  Medication Sig Dispense Refill Last Dose  . acetaminophen (TYLENOL) 500 MG tablet Take 1,000 mg by mouth every 4 (four) hours as needed for mild pain or headache.   04/22/2014 at 2200  . Calcium Carbonate-Vitamin D (CALCIUM 600+D) 600-400 MG-UNIT per tablet Take 1 tablet by mouth 2 (two) times daily.   04/22/2014 at Unknown time  . Cholecalciferol (VITAMIN D3) 5000 UNITS TABS Take 1 tablet by mouth every Monday, Wednesday, and Friday.   04/22/2014 at Unknown time  . Cranberry 500 MG CAPS Take 1 capsule by mouth daily.   04/22/2014 at Unknown time  . folic acid (FOLVITE) 400 MCG tablet Take 400 mcg by mouth daily.     04/22/2014 at Unknown time  . levothyroxine (SYNTHROID, LEVOTHROID) 75 MCG tablet Take 75 mcg by mouth  daily.     04/22/2014 at Unknown time  . metoprolol tartrate (LOPRESSOR) 25 MG tablet TAKE ONE TABLET BY MOUTH TWICE DAILY-MUST BE SEEN (Patient taking differently: TAKE ONE TABLET BY MOUTH TWICE DAILY) 30 tablet 6 04/23/2014 at 0500  . Misc Natural Products (TURMERIC CURCUMIN) CAPS Take 1 capsule by mouth daily.     04/22/2014 at Unknown time  . Multiple Vitamins-Minerals (CENTRUM PO) Take 1 tablet by mouth daily.     04/22/2014 at Unknown time  . Omega-3 Fatty Acids (FISH OIL) 1200 MG CAPS Take 1 capsule by mouth 2 (two) times daily.    04/22/2014 at Unknown time  . pravastatin (PRAVACHOL) 40 MG tablet Take 40 mg by mouth daily.    04/22/2014 at Unknown time  . vitamin B-12 (CYANOCOBALAMIN) 500 MCG tablet Take 500 mcg by mouth daily.   04/22/2014 at Unknown time  . warfarin (COUMADIN) 5 MG tablet Take 2.5-5 mg by mouth daily. Takes  on Monday, Wednesday, and Friday Takes 2.5mg  on Tuesday, Thursday, Saturday, and Sunday   04/22/2014 at 2000  . warfarin (COUMADIN) 2.5 MG tablet Take as directed by anticoagulation clinic (Patient not taking: Reported on 04/23/2014) 60 tablet 3 Not Taking at Unknown time   Scheduled:  . acetaminophen  1,000 mg Oral On Call to OR  .  ceFAZolin (ANCEF) IV  2 g Intravenous Q6H  . docusate sodium  100 mg Oral BID  . levothyroxine  75 mcg Oral QAC breakfast  . metoprolol tartrate  25 mg Oral BID  . pravastatin  40 mg Oral Daily  . sodium chloride  3 mL Intravenous Q12H   Infusions:  . sodium chloride 50 mL/hr at 04/23/14 1658  . lactated ringers 10 mL/hr at 04/25/14 1028    Assessment: 81yo female with history of Afib presents with hip fracture for surgery. Pharmacy is consulted to dose warfarin for VTE prophylaxis. INR is 1.42, Hgb 13.3, Plt 138.  PTA Warfarin dose: 5mg  MWF, 2.5mg  AODs -last dose was 2/15  Goal of Therapy:  INR 2-3 Monitor platelets by anticoagulation protocol: Yes   Plan:  Warfarin 2.5mg  tonight x 1 Daily INR Continue to monitor H&H and  platelets  Monitor s/sx of bleeding  Arlean Hoppingorey M. Newman PiesBall, PharmD Clinical Pharmacist Pager [phone] 04/25/2014,5:00 PM

## 2014-04-25 NOTE — Progress Notes (Signed)
Patient in surgery- will check on later or see in AM Amanda CanaryJessica Vann DO

## 2014-04-25 NOTE — H&P (View-Only) (Signed)
ORTHOPAEDIC CONSULTATION  REQUESTING PHYSICIAN: Delfina Redwood, MD  Chief Complaint: Right hip fracture  HPI: Amanda [name] is a 81 y.o. female who suffered a mechanical fall onto her R side  Past Medical History  Diagnosis Date  . Atrial fibrillation   . HLD (hyperlipidemia)   . GERD (gastroesophageal reflux disease)   . Back pain   . Osteoarthritis     knee  . Hypothyroidism   . Osteoporosis   . PUD (peptic ulcer disease)     hx of upper GI bleeding   . Arthritis    Past Surgical History  Procedure Laterality Date  . 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 Marital Status: Married    Spouse Name: N/A  . Number of Children: N/A  . Years of Education: N/A   Social History Main Topics  . Smoking status: Never Smoker   . Smokeless tobacco: Never Used     Comment: tobacco use - no  . Alcohol Use: No  . Drug Use: No  . Sexual Activity: No   Other Topics Concern  . None   Social History Narrative   Family History  Problem Relation Age of Onset  . Kidney cancer      family hx  . Coronary artery disease      family hx  . Arthritis      family hx   Allergies  Allergen Reactions  . Aspirin Diarrhea    REACTION: black tarry stools  . Nsaids Other (See Comments)    Patient has ulcer   . Codeine Other (See Comments)    REACTION: GI upset  . Sulfonamide Derivatives Rash   Prior to Admission medications   Medication Sig Start Date End Date Taking? Authorizing Provider  acetaminophen (TYLENOL) 500 MG tablet Take 1,000 mg by mouth every 4 (four) hours as needed for mild pain or headache.   Yes Historical Provider, MD  Calcium Carbonate-Vitamin D (CALCIUM 600+D) 600-400 MG-UNIT per tablet Take 1 tablet by mouth 2 (two) times daily.   Yes Historical Provider, MD  Cholecalciferol (VITAMIN D3) 5000 UNITS TABS Take 1 tablet by mouth every Monday, Wednesday, and Friday.   Yes Historical Provider, MD  Cranberry 500 MG CAPS Take  1 capsule by mouth daily.   Yes Historical Provider, MD  folic acid (FOLVITE) 427 MCG tablet Take 400 mcg by mouth daily.     Yes Historical Provider, MD  levothyroxine (SYNTHROID, LEVOTHROID) 75 MCG tablet Take 75 mcg by mouth daily.     Yes Historical Provider, MD  metoprolol tartrate (LOPRESSOR) 25 MG tablet TAKE ONE TABLET BY MOUTH TWICE DAILY-MUST BE SEEN Patient taking differently: TAKE ONE TABLET BY MOUTH TWICE DAILY 06/14/13  Yes Dorothy Spark, MD  Misc Natural Products (TURMERIC CURCUMIN) CAPS Take 1 capsule by mouth daily.     Yes Historical Provider, MD  Multiple Vitamins-Minerals (CENTRUM PO) Take 1 tablet by mouth daily.     Yes Historical Provider, MD  Omega-3 Fatty Acids (FISH OIL) 1200 MG CAPS Take 1 capsule by mouth 2 (two) times daily.    Yes Historical Provider, MD  pravastatin (PRAVACHOL) 40 MG tablet Take 40 mg by mouth daily.    Yes Historical Provider, MD  vitamin B-12 (CYANOCOBALAMIN) 500 MCG tablet Take 500 mcg by mouth daily.   Yes Historical Provider, MD  warfarin (COUMADIN) 5 MG tablet Take 2.5-5 mg by mouth daily. Takes 63m  on Monday, Wednesday, and Friday Takes 2.85m on Tuesday, Thursday, Saturday, and Sunday   Yes Historical Provider, MD  warfarin (COUMADIN) 2.5 MG tablet Take as directed by anticoagulation clinic Patient not taking: Reported on 04/23/2014 02/29/12   TRenella Cunas MD   Dg Chest 1 View  04/23/2014   CLINICAL DATA:  81year old female who fell last night with hip fracture. Preoperative study. Initial encounter.  EXAM: CHEST  1 VIEW  COMPARISON:  08/02/2009.  FINDINGS: Portable AP semi upright view at 1018 hours. Stable cardiomegaly and mediastinal contours. Visualized tracheal air column is within normal limits. Stable lung volumes. Allowing for portable technique, the lungs are clear. No acute osseous abnormality identified.  IMPRESSION: Stable cardiomegaly. No acute cardiopulmonary abnormality.   Electronically Signed   By: HGenevie AnnM.D.   On:  04/23/2014 10:34   Dg Hip Unilat With Pelvis 2-3 Views Right  04/23/2014   CLINICAL DATA:  81year old female who fell last night with severe right hip pain. Initial encounter.  EXAM: RIGHT HIP (WITH PELVIS) 2-3 VIEWS  COMPARISON:  None.  FINDINGS: Right femoral neck fracture risk varus impaction. Right femoral head remains normally located.  No superimposed acute pelvis fracture identified. Osteopenia. Grossly intact proximal left femur. Partially visible advanced lumbar spine degeneration.  IMPRESSION: Acute right femoral neck fracture with varus impaction.   Electronically Signed   By: HGenevie AnnM.D.   On: 04/23/2014 10:33    Positive ROS: All other systems have been reviewed and were otherwise negative with the exception of those mentioned in the HPI and as above.  Labs cbc  Recent Labs  04/23/14 1002  WBC 12.9*  HGB 15.1*  HCT 46.1*  PLT 136*    Labs inflam No results for input(s): CRP in the last 72 hours.  Invalid input(s): ESR  Labs coag  Recent Labs  04/23/14 1002  INR 3.37*     Recent Labs  04/23/14 1002  NA 139  K 4.2  CL 105  CO2 27  GLUCOSE 183*  BUN 15  CREATININE 0.92  CALCIUM 9.1    Physical Exam: Filed Vitals:   04/24/14 0519  BP: 144/69  Pulse: 73  Temp: 97.9 F (36.6 C)  Resp: 16   General: Alert, no acute distress Cardiovascular: No pedal edema Respiratory: No cyanosis, no use of accessory musculature GI: No organomegaly, abdomen is soft and non-tender Skin: No lesions in the area of chief complaint other than those listed below in MSK exam.  Neurologic: Sensation intact distally Psychiatric: Patient is competent for consent with normal mood and affect Lymphatic: No axillary or cervical lymphadenopathy  MUSCULOSKELETAL:  RLE: pain with log roll, skin benign, distally NVI Other extremities are atraumatic with painless ROM and NVI.  Assessment: R fem neck fracture  Plan: Will need R hip Hemi, we discussed risks and benefits with  patient and her daughter, they would like to attempt spinal anesthesia Plan for OR on 2/18 Thursday for hemi pending INR. Goal INR is 1.7  Weight Bearing Status: bedrest for now, WBAT post op PT VTE px: SCD's and holding for now, OK to restart post op   MEdmonia Lynch D, MD Cell [phone]  04/24/2014 8:11 AM

## 2014-04-25 NOTE — Progress Notes (Signed)
     Subjective:  Patient reports pain as mild.  Patient was able to sleep well last night.  She remains NWB on the RLE.  Patients INR came down to 1.42 on 04/24/14.  Patient has been NPO since midnight for scheduled surgery today.  Objective:   VITALS:   Filed Vitals:   04/24/14 0519 04/24/14 1400 04/24/14 2023 04/25/14 0554  BP: 144/69 144/72 135/70 108/89  Pulse: 73 59 64 59  Temp: 97.9 F (36.6 C) 97.8 F (36.6 C) 97.6 F (36.4 C) 98 F (36.7 C)  TempSrc:   Oral Oral  Resp: 16 16  15   Height:      Weight:      SpO2: 96% 99% 97% 97%    Neurologically intact ABD soft Neurovascular intact Sensation intact distally Intact pulses distally   Lab Results  Component Value Date   WBC 11.8* 04/24/2014   HGB 13.3 04/24/2014   HCT 40.4 04/24/2014   MCV 92.9 04/24/2014   PLT 138* 04/24/2014   BMET    Component Value Date/Time   NA 137 04/24/2014 0740   K 3.8 04/24/2014 0740   CL 105 04/24/2014 0740   CO2 29 04/24/2014 0740   GLUCOSE 112* 04/24/2014 0740   BUN 19 04/24/2014 0740   CREATININE 1.15* 04/24/2014 0740   CALCIUM 8.5 04/24/2014 0740   GFRNONAA 41* 04/24/2014 0740   GFRAA 47* 04/24/2014 0740     Assessment/Plan:     Principal Problem:   Femoral neck fracture Active Problems:   Essential hypertension   Atrial fibrillation   Osteoporosis   Hip fracture due to osteoporosis   Plan to for surgery today to repair Right Fem Neck Fx Coumadin held till after surgery. INR was 1.42 04/24/14 Patient shall remain NWB on the RLE till surgery   Cheng Dec [name] 04/25/2014, 8:43 AM

## 2014-04-25 NOTE — Transfer of Care (Signed)
Immediate Anesthesia Transfer of Care Note  Patient: Amanda [name]  Procedure(s) Performed: Procedure(s): ARTHROPLASTY BIPOLAR HIP (Right)  Patient Location: PACU  Anesthesia Type:General  Level of Consciousness: awake, alert  and oriented  Airway & Oxygen Therapy: Patient Spontanous Breathing and Patient connected to face mask oxygen  Post-op Assessment: Report given to RN and Post -op Vital signs reviewed and stable  Post vital signs: Reviewed and stable  Last Vitals:  Filed Vitals:   04/25/14 0938  BP: 157/91  Pulse: 80  Temp: 36.4 C  Resp: 18    Complications: No apparent anesthesia complications

## 2014-04-25 NOTE — Anesthesia Preprocedure Evaluation (Signed)
Anesthesia Evaluation  Patient identified by MRN, date of birth, ID band Patient awake    Reviewed: Allergy & Precautions, NPO status , Patient's Chart, lab work & pertinent test results  Airway Mallampati: II  TM Distance: >3 FB Neck ROM: Full    Dental   Pulmonary          Cardiovascular Rhythm:Irregular     Neuro/Psych    GI/Hepatic GERD-  Controlled,  Endo/Other    Renal/GU      Musculoskeletal  (+) Arthritis -, Osteoarthritis,    Abdominal   Peds  Hematology   Anesthesia Other Findings   Reproductive/Obstetrics                             Anesthesia Physical Anesthesia Plan  ASA: III  Anesthesia Plan: General   Post-op Pain Management:    Induction: Intravenous  Airway Management Planned: Oral ETT  Additional Equipment:   Intra-op Plan:   Post-operative Plan: Extubation in OR  Informed Consent: I have reviewed the patients History and Physical, chart, labs and discussed the procedure including the risks, benefits and alternatives for the proposed anesthesia with the patient or authorized representative who has indicated his/her understanding and acceptance.   Dental advisory given  Plan Discussed with: Anesthesiologist and Surgeon  Anesthesia Plan Comments:         Anesthesia Quick Evaluation

## 2014-04-25 NOTE — Op Note (Signed)
04/23/2014 - 04/25/2014  1:34 PM  PATIENT:  Amanda [name]   MRN: [id number]  PRE-OPERATIVE DIAGNOSIS:  right hip fracture  POST-OPERATIVE DIAGNOSIS:  right hip fracture  PROCEDURE:  Procedure(s): ARTHROPLASTY BIPOLAR HIP  PREOPERATIVE INDICATIONS:  [name] is an 81 y.o. female who was admitted 04/23/2014 with a diagnosis of Femoral neck fracture and elected for surgical management.  The risks benefits and alternatives were discussed with the patient including but not limited to the risks of nonoperative treatment, versus surgical intervention including infection, bleeding, nerve injury, periprosthetic fracture, the need for revision surgery, dislocation, leg length discrepancy, blood clots, cardiopulmonary complications, morbidity, mortality, among others, and they were willing to proceed.  Predicted outcome is good, although there will be at least a six to nine month expected recovery.   OPERATIVE REPORT     SURGEON:  Edmonia Lynch, MD    ASSISTANT:  Lovett Calender, PA-C, She was present and scrubbed throughout the case, critical for completion in a timely fashion, and for retraction, instrumentation, and closure.       ANESTHESIA:  General    COMPLICATIONS:  None.      COMPONENTS:  [employer]: Femoral stem: 3, , Neck:-4   PROCEDURE IN DETAIL: The patient was met in the holding area and identified.  The appropriate hip  was marked at the operative site. The patient was then transported to the OR and  placed under general anesthesia.  At that point, the patient was  placed in the lateral decubitus position with the operative side up and  secured to the operating room table and all bony prominences padded.     The operative lower extremity was prepped from the iliac crest to the toes.  Sterile draping was performed.  Time out was performed prior to incision.      A routine posterolateral approach was utilized via sharp dissection  carried down to the subcutaneous tissue.   Gross bleeders were Bovie  coagulated.  The iliotibial band was identified and incised  along the length of the skin incision.  Self-retaining retractors were  inserted.  With the hip internally rotated, the short external rotators  were identified. The piriformis was tagged with FiberWire, and the hip capsule released in a T-type fashion.  The femoral neck was exposed, and I resected the femoral neck using the appropriate jig. This was performed at approximately a thumb's breadth above the lesser trochanter.    I then exposed the deep acetabulum, cleared out any tissue including the ligamentum teres, and included the hip capsule in the FiberWire used above and below the T.    I then prepared the proximal femur using the cookie-cutter, the lateralizing reamer, and then sequentially broached.  A trial utilized, and I reduced the hip and it was found to have excellent stability with functional range of motion. The trial components were then removed.   The canal and acetabulum were thoroughly irrigated  I inserted the pressfit stem and placed the head and neck collar. The hip was reduced with appropriate force and was stable through a range of motion.   I then used a 2 mm drill bits to pass the FiberWire suture from the capsule and puriform is through the greater trochanter, and secured this. Excellent posterior capsular repair was achieved. I also closed the T in the capsule.  I then irrigated the hip copiously again with pulse lavage, and repaired the fascia with Vicryl, followed by Vicryl for the subcutaneous tissue, Monocryl for the skin,  Steri-Strips and sterile gauze. The wounds were injected. The patient was then awakened and returned to PACU in stable and satisfactory condition. There were no complications.  POST-OP PLAN: Weight bearing as tolerated. DVT px will consist of SCD's and ASA 325  Edmonia Lynch, MD Orthopedic Surgeon [phone]   04/25/2014 1:34 PM   This note was  generated using a template and dragon dictation system. In light of that, I have reviewed the note and all aspects of it are applicable to this case. Any dictation errors are due to the computerized dictation system.

## 2014-04-25 NOTE — Anesthesia Postprocedure Evaluation (Signed)
  Anesthesia Post-op Note  Patient: Amanda MulletHallie Gashi  Procedure(s) Performed: Procedure(s): ARTHROPLASTY BIPOLAR HIP (Right)  Patient Location: PACU  Anesthesia Type:General  Level of Consciousness: awake and alert   Airway and Oxygen Therapy: Patient Spontanous Breathing  Post-op Pain: none  Post-op Assessment: Post-op Vital signs reviewed  Post-op Vital Signs: Reviewed  Last Vitals:  Filed Vitals:   04/25/14 1611  BP: 158/87  Pulse:   Temp: 36.7 C  Resp: 16    Complications: No apparent anesthesia complications

## 2014-04-26 DIAGNOSIS — E876 Hypokalemia: Secondary | ICD-10-CM

## 2014-04-26 LAB — BASIC METABOLIC PANEL
Anion gap: 3 — ABNORMAL LOW (ref 5–15)
BUN: 10 mg/dL (ref 6–23)
CHLORIDE: 101 mmol/L (ref 96–112)
CO2: 32 mmol/L (ref 19–32)
Calcium: 8.3 mg/dL — ABNORMAL LOW (ref 8.4–10.5)
Creatinine, Ser: 0.97 mg/dL (ref 0.50–1.10)
GFR calc non Af Amer: 50 mL/min — ABNORMAL LOW (ref >90)
GFR, EST AFRICAN AMERICAN: 58 mL/min — AB (ref >90)
Glucose, Bld: 107 mg/dL — ABNORMAL HIGH (ref 70–99)
Potassium: 3.4 mmol/L — ABNORMAL LOW (ref 3.5–5.1)
SODIUM: 136 mmol/L (ref 135–145)

## 2014-04-26 LAB — CBC
HCT: 39.3 % (ref 36.0–46.0)
HEMOGLOBIN: 12.8 g/dL (ref 12.0–15.0)
MCH: 30.1 pg (ref 26.0–34.0)
MCHC: 32.6 g/dL (ref 30.0–36.0)
MCV: 92.5 fL (ref 78.0–100.0)
Platelets: 133 10*3/uL — ABNORMAL LOW (ref 150–400)
RBC: 4.25 MIL/uL (ref 3.87–5.11)
RDW: 13.3 % (ref 11.5–15.5)
WBC: 9 10*3/uL (ref 4.0–10.5)

## 2014-04-26 LAB — PROTIME-INR
INR: 1.24 (ref 0.00–1.49)
PROTHROMBIN TIME: 15.8 s — AB (ref 11.6–15.2)

## 2014-04-26 MED ORDER — POLYETHYLENE GLYCOL 3350 17 G PO PACK
17.0000 g | PACK | Freq: Every day | ORAL | Status: DC | PRN
  Administered 2014-04-28: 17 g via ORAL
  Filled 2014-04-26: qty 1

## 2014-04-26 MED ORDER — WARFARIN SODIUM 5 MG PO TABS
5.0000 mg | ORAL_TABLET | Freq: Once | ORAL | Status: AC
  Administered 2014-04-26: 5 mg via ORAL
  Filled 2014-04-26: qty 1

## 2014-04-26 MED ORDER — POTASSIUM CHLORIDE CRYS ER 20 MEQ PO TBCR
40.0000 meq | EXTENDED_RELEASE_TABLET | Freq: Once | ORAL | Status: AC
  Administered 2014-04-26: 40 meq via ORAL
  Filled 2014-04-26: qty 2

## 2014-04-26 MED ORDER — ENSURE COMPLETE PO LIQD
237.0000 mL | Freq: Two times a day (BID) | ORAL | Status: DC
  Administered 2014-04-26 – 2014-04-29 (×8): 237 mL via ORAL

## 2014-04-26 NOTE — Evaluation (Signed)
Occupational Therapy Evaluation Patient Details Name: Amanda [name] MRN: [id number] DOB: 08/07/1922 Today's Date: 04/26/2014    History of Present Illness Pt is a 81 y.o. female who fell and suffered femoral neck fx; pt is s/p Rt THA.    Clinical Impression   Pt demonstrates decline  In function and safety with ADLs and ADL mobility with decreased strength, balance and endurance. Pt fearful and anxious and requires encouragement. Pt would benefit from acute OT services to address impairments to increase level of function and safety    Follow Up Recommendations  Home health OT;Supervision/Assistance - 24 hour    Equipment Recommendations  Tub/shower bench;3 in 1 bedside comode    Recommendations for Other Services       Precautions / Restrictions Precautions Precautions: Fall Restrictions Weight Bearing Restrictions: Yes RLE Weight Bearing: Weight bearing as tolerated      Mobility Bed Mobility Overal bed mobility: Needs Assistance Bed Mobility: Sit to Supine     Supine to sit: Min assist;HOB elevated Sit to supine: Mod assist   General bed mobility comments: mod A  with LEs onto bed  Transfers Overall transfer level: Needs assistance Equipment used: Rolling walker (2 wheeled) Transfers: Sit to/from [employer]Stand;Stand Pivot Transfers Sit to Stand: Independent Stand pivot transfers: Mod assist       General transfer comment: cues for technique and hand placement; pt anxious and fearful of falling    Balance Overall balance assessment: Needs assistance Sitting-balance support: No upper extremity supported;Feet supported Sitting balance-Leahy Scale: Fair     Standing balance support: Single extremity supported;Bilateral upper extremity supported;During functional activity Standing balance-Leahy Scale: Poor Standing balance comment: RW and min (A)                             ADL Overall ADL's : Needs assistance/impaired     Grooming: Wash/dry  hands;Wash/dry face;Sitting;Set up   Upper Body Bathing: Supervision/ safety;Set up;Sitting   Lower Body Bathing: Maximal assistance   Upper Body Dressing : Supervision/safety;Set up;Sitting   Lower Body Dressing: Total assistance   Toilet Transfer: Minimal assistance;Ambulation;RW   Toileting- Clothing Manipulation and Hygiene: Maximal assistance       Functional mobility during ADLs: Minimal assistance;Cueing for safety General ADL Comments: pt fearful during ADL mobility, required cues to compleet tasks     Vision  wears glasses   Perception Perception Perception Tested?: No   Praxis      Pertinent Vitals/Pain Pain Assessment: 0-10 Pain Score: 5  Pain Location: R hip Pain Descriptors / Indicators: Aching Pain Intervention(s): Limited activity within patient's tolerance;Monitored during session;Repositioned     Hand Dominance Right   Extremity/Trunk Assessment Upper Extremity Assessment Upper Extremity Assessment: Overall WFL for tasks assessed;Generalized weakness   Lower Extremity Assessment Lower Extremity Assessment: Defer to PT evaluation RLE Deficits / Details: quad 3/5; hip 3-/5 RLE Coordination: decreased gross motor   Cervical / Trunk Assessment Cervical / Trunk Assessment: Normal   Communication Communication Communication: No difficulties   Cognition Arousal/Alertness: Awake/alert Behavior During Therapy: WFL for tasks assessed/performed Overall Cognitive Status: Within Functional Limits for tasks assessed                     General Comments   Pt pleasant, anxious                Home Living Family/patient expects to be discharged to:: Private residence Living Arrangements: Spouse/significant other;Children Available Help at Discharge:  Family;Available 24 hours/day Type of Home: House Home Access: Stairs to enter [employer] of Steps: 3-4 Entrance Stairs-Rails: Can reach both;Left;Right Home Layout: One level      Bathroom Shower/Tub: Chief Strategy Officer: Standard     Home Equipment: None   Additional Comments: retired son lives with pt      Prior Functioning/Environment Level of Independence: Independent             OT Diagnosis: Generalized weakness;Acute pain   OT Problem List: Decreased strength;Decreased knowledge of use of DME or AE;Decreased activity tolerance;Impaired balance (sitting and/or standing);Pain   OT Treatment/Interventions: Self-care/ADL training;Therapeutic exercise;Patient/family education;Therapeutic activities;DME and/or AE instruction    OT Goals(Current goals can be found in the care plan section) Acute Rehab OT Goals Patient Stated Goal: to go home OT Goal Formulation: With patient/family Time For Goal Achievement: 05/03/14 Potential to Achieve Goals: Good ADL Goals Pt Will Perform Grooming: with min assist;standing Pt Will Perform Lower Body Bathing: with mod assist;sitting/lateral leans;sit to/from stand Pt Will Perform Lower Body Dressing: with max assist;with mod assist;sitting/lateral leans;sit to/from stand Pt Will Transfer to Toilet: with min guard assist;with supervision;grab bars;ambulating;bedside commode (3 in 1 over toilet) Pt Will Perform Toileting - Clothing Manipulation and hygiene: with mod assist;sit to/from stand Pt Will Perform Tub/Shower Transfer: with min assist;with min guard assist;tub bench;3 in 1;rolling walker  OT Frequency: Min 2X/week   Barriers to D/C:    none                     End of Session Equipment Utilized During Treatment: Rolling walker  Activity Tolerance: Patient limited by pain Patient left: in bed;with call bell/phone within reach;with family/visitor present   Time: 1331-1359 OT Time Calculation (min): 28 min Charges:  OT General Charges $OT Visit: 1 Procedure OT Evaluation $Initial OT Evaluation Tier I: 1 Procedure OT Treatments $Therapeutic Activity: 8-22 mins G-Codes:     Galen Manila 04/26/2014, 2:10 PM

## 2014-04-26 NOTE — Evaluation (Signed)
Physical Therapy Evaluation Patient Details Name: Amanda [name] MRN: [id number] DOB: 09/18/1922 Today's Date: 04/26/2014   History of Present Illness  Pt is a 81 y.o. female who fell and suffered femoral neck fx; pt is s/p Rt THA.    Clinical Impression  Patient is s/p above surgery resulting in functional limitations due to the deficits listed below (see PT Problem List).  Patient will benefit from skilled PT to increase their independence and safety with mobility to allow discharge to the venue listed below.  Pt anxious initially to get OOB with therapy. Once up in chair pt was very happy and motivated to participate in therapy. Pt with good family support. Anticipate good progress with therapy.      Follow Up Recommendations Home health PT;Supervision/Assistance - 24 hour    Equipment Recommendations  Rolling walker with 5" wheels;3in1 (PT)    Recommendations for Other Services OT consult     Precautions / Restrictions Precautions Precautions: Fall Restrictions Weight Bearing Restrictions: Yes RLE Weight Bearing: Weight bearing as tolerated      Mobility  Bed Mobility Overal bed mobility: Needs Assistance Bed Mobility: Supine to Sit     Supine to sit: Min assist;HOB elevated     General bed mobility comments: cues for technique; min (A) to elevate trunk  Transfers Overall transfer level: Needs assistance Equipment used: Rolling walker (2 wheeled) Transfers: Sit to/from [employer]Stand;Stand Pivot Transfers Sit to Stand: Min assist Stand pivot transfers: Mod assist       General transfer comment: cues for technique and hand placement; pt anxious and fearful of falling initiallyl  Ambulation/Gait             General Gait Details: pivotal steps to chair  Stairs            Wheelchair Mobility    Modified Rankin (Stroke Patients Only)       Balance Overall balance assessment: Needs assistance Sitting-balance support: Feet supported;No upper extremity  supported Sitting balance-Leahy Scale: Fair     Standing balance support: During functional activity;Bilateral upper extremity supported Standing balance-Leahy Scale: Poor Standing balance comment: RW and min (A)                              Pertinent Vitals/Pain Pain Assessment: No/denies pain    Home Living Family/patient expects to be discharged to:: Private residence Living Arrangements: Spouse/significant other;Children Available Help at Discharge: Family;Available 24 hours/day Type of Home: House Home Access: Stairs to enter Entrance Stairs-Rails: Can reach both;Left;Right Entrance Stairs-Number of Steps: 3-4 Home Layout: One level Home Equipment: None Additional Comments: retired son lives with pt    Prior Function Level of Independence: Independent               Higher education careers adviserHand Dominance        Extremity/Trunk Assessment   Upper Extremity Assessment: Defer to OT evaluation           Lower Extremity Assessment: RLE deficits/detail RLE Deficits / Details: quad 3/5; hip 3-/5    Cervical / Trunk Assessment: Normal  Communication   Communication: No difficulties  Cognition Arousal/Alertness: Awake/alert Behavior During Therapy: WFL for tasks assessed/performed Overall Cognitive Status: Within Functional Limits for tasks assessed                      General Comments      Exercises Low Level/ICU Exercises Ankle Circles/Pumps: AROM;Both;10 reps;Seated  Assessment/Plan    PT Assessment Patient needs continued PT services  PT Diagnosis Difficulty walking;Generalized weakness;Acute pain   PT Problem List Decreased strength;Decreased range of motion;Decreased activity tolerance;Decreased balance;Decreased mobility;Decreased knowledge of use of DME;Decreased knowledge of precautions;Pain  PT Treatment Interventions DME instruction;Gait training;Stair training;Functional mobility training;Therapeutic activities;Therapeutic  exercise;Balance training;Neuromuscular re-education;Patient/family education   PT Goals (Current goals can be found in the Care Plan section) Acute Rehab PT Goals Patient Stated Goal: to go home PT Goal Formulation: With patient Time For Goal Achievement: 05/03/14 Potential to Achieve Goals: Good    Frequency Min 5X/week   Barriers to discharge        Co-evaluation               End of Session Equipment Utilized During Treatment: Gait belt Activity Tolerance: Patient tolerated treatment well Patient left: in chair;with call bell/phone within reach;with family/visitor present Nurse Communication: Mobility status;Precautions;Weight bearing status         Time: 2952-8413 PT Time Calculation (min) (ACUTE ONLY): 18 min   Charges:   PT Evaluation $Initial PT Evaluation Tier I: 1 Procedure     PT G CodesDonell Sievert, [place]  [phone] 04/26/2014, 12:45 PM

## 2014-04-26 NOTE — Progress Notes (Signed)
CARE MANAGEMENT NOTE 04/26/2014  Patient:  Amanda MulletHOMAS,Ezzie   Account Number:  [id number]  Date Initiated:  04/26/2014  Documentation initiated by:  [facility],Alexa Blish  Subjective/Objective Assessment:   fell, rt femoral neck fracture,s/p rt hip hemi-arthroplasty     Action/Plan:   PT/OT evals-recommended HHPT, rolling walker and 3N1   Anticipated DC Date:  04/27/2014   Anticipated DC Plan:  HOME W HOME HEALTH SERVICES      DC Planning Services  CM consult      [facility]  DURABLE [facility] EQUIPMENT  HOME HEALTH   Choice offered to / List presented to:  C-4 Adult Children   DME arranged  3-N-1  Levan HurstWALKER - ROLLING      DME agency  [facility]        Status of service:  In process, will continue to follow Medicare Important Message given?  YES Date Medicare IM given:  04/26/2014 Medicare IM given by:  [facility],Trayson Stitely  Discharge Disposition:    Per UR Regulation:  Reviewed for med. necessity/level of care/duration of stay    Comments:  04/26/14 Spoke with patient and her son about HHC. Son stated that he would like to discuss home health agencies with other family members.[facility] [place] [place] [facility]. Son stated that they do not have a rolling walker or 3N1. Contacted Frank with [facility] and requested rolling walker and 3N1 be delivered to patient's room. CM will continue to follow to set up HHC.

## 2014-04-26 NOTE — Progress Notes (Signed)
     Subjective:  Patient reports pain as moderate.  Up with therapy and sat in a chair for several hours today.  States that she has significant pain in the right hip with ambulation.  Objective:   VITALS:   Filed Vitals:   04/25/14 2355 04/26/14 0140 04/26/14 0337 04/26/14 0600  BP:  143/65  140/68  Pulse:  78  74  Temp:  98.3 F (36.8 C)  98.5 F (36.9 C)  TempSrc:      Resp: 16 16 16 16   Height:      Weight:      SpO2: 99% 98% 97% 96%    Neurologically intact ABD soft Neurovascular intact Sensation intact distally Intact pulses distally Incision: dressing C/D/I   Lab Results  Component Value Date   WBC 9.0 04/26/2014   HGB 12.8 04/26/2014   HCT 39.3 04/26/2014   MCV 92.5 04/26/2014   PLT 133* 04/26/2014   BMET    Component Value Date/Time   NA 136 04/26/2014 0650   K 3.4* 04/26/2014 0650   CL 101 04/26/2014 0650   CO2 32 04/26/2014 0650   GLUCOSE 107* 04/26/2014 0650   BUN 10 04/26/2014 0650   CREATININE 0.97 04/26/2014 0650   CALCIUM 8.3* 04/26/2014 0650   GFRNONAA 50* 04/26/2014 0650   GFRAA 58* 04/26/2014 0650     Assessment/Plan: 1 Day Post-Op   Principal Problem:   Femoral neck fracture Active Problems:   Essential hypertension   Atrial fibrillation   Osteoporosis   Hip fracture due to osteoporosis   Up with therapy  WBAT in the right hip Continue Coumadin for DVT prophylaxis Plan to discharge to rehab when ready and a bed is available   Amanda [name] Amanda [name] 04/26/2014, 4:58 PM

## 2014-04-26 NOTE — Progress Notes (Signed)
ANTICOAGULATION CONSULT NOTE - Follow Up Consult  Pharmacy Consult for coumadin Indication: atrial fibrillation  Allergies  Allergen Reactions  . Aspirin Diarrhea    REACTION: black tarry stools  . Nsaids Other (See Comments)    Patient has ulcer   . Codeine Other (See Comments)    REACTION: GI upset  . Sulfonamide Derivatives Rash    Patient Measurements: Height: 5\' 6"  (167.6 cm) Weight: 125 lb (56.7 kg) IBW/kg (Calculated) : 59.3 Heparin Dosing Weight:   Vital Signs: Temp: 98.5 F (36.9 C) (02/19 0600) BP: 140/68 mmHg (02/19 0600) Pulse Rate: 74 (02/19 0600)  Labs:  Recent Labs  04/24/14 0740 04/26/14 0650  HGB 13.3 12.8  HCT 40.4 39.3  PLT 138* 133*  LABPROT 17.5* 15.8*  INR 1.42 1.24  CREATININE 1.15* 0.97    Estimated Creatinine Clearance: 35.2 mL/min (by C-G formula based on Cr of 0.97).   Medications:  Scheduled:  . Chlorhexidine Gluconate Cloth  6 each Topical Daily  . docusate sodium  100 mg Oral BID  . levothyroxine  75 mcg Oral QAC breakfast  . metoprolol tartrate  25 mg Oral BID  . mupirocin ointment  1 application Nasal BID  . pravastatin  40 mg Oral Daily  . sodium chloride  3 mL Intravenous Q12H  . Warfarin - Pharmacist Dosing Inpatient   Does not apply q1800   Infusions:  . sodium chloride 50 mL/hr at 04/25/14 2120  . lactated ringers 10 mL/hr at 04/25/14 1028    Assessment: 81 yo female with afib s/p hip fracture for surgery is currently on subtherapeutic coumadin.  INR today is 1.24 Goal of Therapy:  INR 2-3 Monitor platelets by anticoagulation protocol: Yes   Plan:  - Coumadin 5 mg po x1 - INR in am  Foy Vanduyne, Tsz-Yin 04/26/2014,10:13 AM

## 2014-04-26 NOTE — Progress Notes (Signed)
[facility] PROGRESS NOTE  Amanda [name] RUE:[id number] DOB: 18-Jun-1922 DOA: 04/23/2014 PCP: Herb Grays, MD  Assessment/Plan:  Principal Problem:   Femoral neck fracture: medically optimized for surgery    Essential hypertension   Atrial fibrillation: coumadin reversed.   Osteoporosis  Hypokalemia -replete  Code Status:  full Family Communication:  Daughter at bedside Disposition Plan:  PT eval  Consultants:  orthopedics  Procedures:     Antibiotics:    HPI/Subjective: Passing gas but no BM Little appetite  Objective: Filed Vitals:   04/26/14 0600  BP: 140/68  Pulse: 74  Temp: 98.5 F (36.9 C)  Resp: 16    Intake/Output Summary (Last 24 hours) at 04/26/14 1049 Last data filed at 04/26/14 0400  Gross per 24 hour  Intake   1703 ml  Output    200 ml  Net   1503 ml   Filed Weights   04/23/14 0942  Weight: 56.7 kg (125 lb)    Exam:   General:  A and o, comfortable  Cardiovascular: irreg irreg  Respiratory: CTA without WRR  Abdomen: S, NT, ND  Ext: no CCE  Basic Metabolic Panel:  Recent Labs Lab 04/23/14 1002 04/24/14 0740 04/26/14 0650  NA 139 137 136  K 4.2 3.8 3.4*  CL 105 105 101  CO2 27 29 32  GLUCOSE 183* 112* 107*  BUN CREATININE 0.92 1.15* 0.97  CALCIUM 9.1 8.5 8.3*   Liver Function Tests:  Recent Labs Lab 04/23/14 1002  AST 40*  ALT 21  ALKPHOS 65  BILITOT 1.4*  PROT 8.2  ALBUMIN 4.2   No results for input(s): LIPASE, AMYLASE in the last 168 hours. No results for input(s): AMMONIA in the last 168 hours. CBC:  Recent Labs Lab 04/23/14 1002 04/24/14 0740 04/26/14 0650  WBC 12.9* 11.8* 9.0  HGB 15.1* 13.3 12.8  HCT 46.1* 40.4 39.3  MCV 94.7 92.9 92.5  PLT 136* 138* 133*   Cardiac Enzymes: No results for input(s): CKTOTAL, CKMB, CKMBINDEX, TROPONINI in the last 168 hours. BNP (last 3 results) No results for input(s): BNP in the last 8760 hours.  ProBNP (last 3 results) No results  for input(s): PROBNP in the last 8760 hours.  CBG: No results for input(s): GLUCAP in the last 168 hours.  Recent Results (from the past 240 hour(s))  Surgical pcr screen     Status: Abnormal   Collection Time: 04/24/14  9:13 PM  Result Value Ref Range Status   MRSA, PCR NEGATIVE NEGATIVE Final   Staphylococcus aureus POSITIVE (A) NEGATIVE Final    Comment:        The Xpert SA Assay (FDA approved for NASAL specimens in patients over 9 years of age), is one component of a comprehensive surveillance program.  Test performance has been validated by [facility] for patients greater than or equal to 77 year old. It is not intended to diagnose infection nor to guide or monitor treatment. RESULT CALLED TO, READ BACK BY AND VERIFIED WITH: Jacqulyn Cane RN [phone] GREEN R      Studies: Pelvis Portable  04/25/2014   CLINICAL DATA:  81 year old female with history of right hip arthroplasty.  EXAM: PORTABLE PELVIS 1-2 VIEWS  COMPARISON:  No priors.  FINDINGS: Two views of the pelvis and right hip demonstrate postoperative changes of right hip hemiarthroplasty. The femoral component appears properly seated without periprosthetic fracture or other immediate complicating features. The prosthetic femoral head appears located within the acetabulum on both  views. Visualized portions of the bony pelvis and left proximal femur are unremarkable. Moderate degenerative changes of osteoarthritis in the left hip joint.  IMPRESSION: 1. Postoperative changes of right hip hemiarthroplasty without acute complicating features, as above.   Electronically Signed   By: Trudie Reedaniel  Entrikin M.D.   On: 04/25/2014 18:23    Scheduled Meds: . Chlorhexidine Gluconate Cloth  6 each Topical Daily  . docusate sodium  100 mg Oral BID  . feeding supplement (ENSURE COMPLETE)  237 mL Oral BID BM  . levothyroxine  75 mcg Oral QAC breakfast  . metoprolol tartrate  25 mg Oral BID  . mupirocin ointment  1 application Nasal BID   . potassium chloride  40 mEq Oral Once  . pravastatin  40 mg Oral Daily  . sodium chloride  3 mL Intravenous Q12H  . warfarin  5 mg Oral ONCE-1800  . Warfarin - Pharmacist Dosing Inpatient   Does not apply q1800   Continuous Infusions: . sodium chloride 50 mL/hr at 04/25/14 2120  . lactated ringers 10 mL/hr at 04/25/14 1028    Time spent: 25 minutes  Zannie Runkle  [facility] [email or web address], password [email or web address] 04/26/2014, 10:49 AM  LOS: 3 days

## 2014-04-26 NOTE — Discharge Instructions (Signed)
Weight bear as tolerated Posterior hip precautions for 6 weeks Resume coumadin per PCP Keep incision clean and dry   -----------------------------------------------------  Information on my medicine - Coumadin   (Warfarin)  This medication education was reviewed with me or my healthcare representative as part of my discharge preparation.  The pharmacist that spoke with me during my hospital stay was:  Dennie Fettersgan, [facility]  Why was Coumadin prescribed for you? Coumadin was prescribed for you because you have a blood clot or a medical condition that can cause an increased risk of forming blood clots. Blood clots can cause serious health problems by blocking the flow of blood to the heart, lung, or brain. Coumadin can prevent harmful blood clots from forming. As a reminder your indication for Coumadin is:   Stroke Prevention Because Of Atrial Fibrillation: and also for blood clot prevention after hip surgery What test will check on my response to Coumadin? While on Coumadin (warfarin) you will need to have an INR test regularly to ensure that your dose is keeping you in the desired range. The INR (international normalized ratio) number is calculated from the result of the laboratory test called prothrombin time (PT).  If an INR APPOINTMENT HAS NOT ALREADY BEEN MADE FOR YOU please schedule an appointment to have this lab work done by your health care provider within 7 days. Your INR goal is usually a number between:  2 to 3 or your provider may give you a more narrow range like 2-2.5.  Ask your health care provider during an office visit what your goal INR is.  What  do you need to  know  About  COUMADIN? Take Coumadin (warfarin) exactly as prescribed by your healthcare provider about the same time each day.  DO NOT stop taking without talking to the doctor who prescribed the medication.  Stopping without other blood clot prevention medication to take the place of Coumadin may increase your  risk of developing a new clot or stroke.  Get refills before you run out.  What do you do if you miss a dose? If you miss a dose, take it as soon as you remember on the same day then continue your regularly scheduled regimen the next day.  Do not take two doses of Coumadin at the same time.  Important Safety Information A possible side effect of Coumadin (Warfarin) is an increased risk of bleeding. You should call your healthcare provider right away if you experience any of the following: ? Bleeding from an injury or your nose that does not stop. ? Unusual colored urine (red or dark brown) or unusual colored stools (red or black). ? Unusual bruising for unknown reasons. ? A serious fall or if you hit your head (even if there is no bleeding).  Some foods or medicines interact with Coumadin (warfarin) and might alter your response to warfarin. To help avoid this: ? Eat a balanced diet, maintaining a consistent amount of Vitamin K. ? Notify your provider about major diet changes you plan to make. ? Avoid alcohol or limit your intake to 1 drink for women and 2 drinks for men per day. (1 drink is 5 oz. wine, 12 oz. beer, or 1.5 oz. liquor.)  Make sure that ANY health care provider who prescribes medication for you knows that you are taking Coumadin (warfarin).  Also make sure the healthcare provider who is monitoring your Coumadin knows when you have started a new medication including herbals and non-prescription products.  Coumadin (  Warfarin)  Major Drug Interactions  Increased Warfarin Effect Decreased Warfarin Effect  Alcohol (large quantities) Antibiotics (esp. Septra/Bactrim, Flagyl, Cipro) Amiodarone (Cordarone) Aspirin (ASA) Cimetidine (Tagamet) Megestrol (Megace) NSAIDs (ibuprofen, naproxen, etc.) Piroxicam (Feldene) Propafenone (Rythmol SR) Propranolol (Inderal) Isoniazid (INH) Posaconazole (Noxafil) Barbiturates (Phenobarbital) Carbamazepine (Tegretol) Chlordiazepoxide  (Librium) Cholestyramine (Questran) Griseofulvin Oral Contraceptives Rifampin Sucralfate (Carafate) Vitamin K   Coumadin (Warfarin) Major Herbal Interactions  Increased Warfarin Effect Decreased Warfarin Effect  Garlic Ginseng Ginkgo biloba Coenzyme Q10 Green tea St. Johns wort    Coumadin (Warfarin) FOOD Interactions  Eat a consistent number of servings per week of foods HIGH in Vitamin K (1 serving =  cup)  Collards (cooked, or boiled & drained) Kale (cooked, or boiled & drained) Mustard greens (cooked, or boiled & drained) Parsley *serving size only =  cup Spinach (cooked, or boiled & drained) Swiss chard (cooked, or boiled & drained) Turnip greens (cooked, or boiled & drained)  Eat a consistent number of servings per week of foods MEDIUM-HIGH in Vitamin K (1 serving = 1 cup)  Asparagus (cooked, or boiled & drained) Broccoli (cooked, boiled & drained, or raw & chopped) Brussel sprouts (cooked, or boiled & drained) *serving size only =  cup Lettuce, raw (green leaf, endive, romaine) Spinach, raw Turnip greens, raw & chopped   These websites have more information on Coumadin (warfarin):  http://www.king-russell.com/; https://www.hines.net/;

## 2014-04-27 ENCOUNTER — Encounter (HOSPITAL_COMMUNITY): Payer: Self-pay | Admitting: Orthopedic Surgery

## 2014-04-27 DIAGNOSIS — K5900 Constipation, unspecified: Secondary | ICD-10-CM

## 2014-04-27 LAB — CBC
HCT: 39.5 % (ref 36.0–46.0)
Hemoglobin: 13 g/dL (ref 12.0–15.0)
MCH: 30.6 pg (ref 26.0–34.0)
MCHC: 32.9 g/dL (ref 30.0–36.0)
MCV: 92.9 fL (ref 78.0–100.0)
Platelets: 147 10*3/uL — ABNORMAL LOW (ref 150–400)
RBC: 4.25 MIL/uL (ref 3.87–5.11)
RDW: 13.4 % (ref 11.5–15.5)
WBC: 9.7 10*3/uL (ref 4.0–10.5)

## 2014-04-27 LAB — PROTIME-INR
INR: 1.3 (ref 0.00–1.49)
PROTHROMBIN TIME: 16.3 s — AB (ref 11.6–15.2)

## 2014-04-27 MED ORDER — FLEET ENEMA 7-19 GM/118ML RE ENEM
1.0000 | ENEMA | Freq: Every day | RECTAL | Status: DC | PRN
  Administered 2014-04-28: 1 via RECTAL
  Filled 2014-04-27: qty 1

## 2014-04-27 MED ORDER — WARFARIN SODIUM 5 MG PO TABS
2.5000 mg | ORAL_TABLET | Freq: Once | ORAL | Status: AC
  Administered 2014-04-27: 2.5 mg via ORAL
  Filled 2014-04-27: qty 0.5

## 2014-04-27 MED ORDER — MAGNESIUM CITRATE PO SOLN
1.0000 | Freq: Once | ORAL | Status: AC
  Administered 2014-04-27: 1 via ORAL
  Filled 2014-04-27: qty 296

## 2014-04-27 NOTE — Care Management Note (Signed)
CARE MANAGEMENT NOTE 04/27/2014  Patient:  MARIYAM, REMINGTON   Account Number:  [id number]  Date Initiated:  04/26/2014  Documentation initiated by:  [facility]  Subjective/Objective Assessment:   fell, rt femoral neck fracture,s/p rt hip hemi-arthroplasty     Action/Plan:   PT/OT evals-recommended HHPT   Anticipated DC Date:  04/27/2014   Anticipated DC Plan:  [place]  CM consult      [facility] Choice  [place]   Choice offered to / List presented to:  C-4 Adult Children   DME arranged  3-N-1  Vassie Moselle      DME agency  [place].        Status of service:  In process, will continue to follow Medicare Important Message given?  YES (If response is "NO", the following Medicare IM given date fields will be blank) Date Medicare IM given:  04/26/2014 Medicare IM given by:  [facility] Date Additional Medicare IM given:   Additional Medicare IM given by:    Discharge Disposition:    Per UR Regulation:  Reviewed for med. necessity/level of care/duration of stay  If discussed at [place] of Stay Meetings, dates discussed:    Comments:  04/27/2014 1525 - Frann Rider, RN, BSN  Met with pt, husband and son. Son agrees with SNF if prn. He can assist her at home 24/7 if she is d/c home. Explained SW referral in case she is not able to return home. Will continue to f/u.   04/26/14 Spoke with patient and her son about HHC. Son stated that he would like to discuss home health agencies with other family members.Gave him [facility] list of home health agencies. Son stated that they do not have a rolling walker or 3N1. Contacted Frank wit [facility] and requested rolling walker and 3N1 be delivered to patient's room. CM will continue to follow to set up [place].

## 2014-04-27 NOTE — Progress Notes (Signed)
SPORTS MEDICINE AND JOINT REPLACEMENT  Georgena SpurlingStephen Lucey, MD   Altamese CabalMaurice Jillane Po, PA-C [address], [place], [place]  [id number]                             [phone]   PROGRESS NOTE  Subjective:  negative for Chest Pain  negative for Shortness of Breath  negative for Nausea/Vomiting   negative for Calf Pain  negative for Bowel Movement   Tolerating Diet: yes         Patient reports pain as 4 on 0-10 scale.    Objective: Vital signs in last 24 hours:   Patient Vitals for the past 24 hrs:  BP Temp Temp src Pulse Resp SpO2  04/27/14 0456 133/71 mmHg 97.8 F (36.6 C) Oral 84 - 95 %  04/27/14 0326 - - - - 16 95 %  04/27/14 0000 - - - - 16 95 %  04/26/14 2000 - - - - 16 95 %  04/26/14 1948 124/72 mmHg 98.4 F (36.9 C) Oral 90 - 95 %    @flow {1959:LAST@   Intake/Output from previous day:       Intake/Output this shift:       Intake/Output      02/19 0701 - 02/20 0700 02/20 0701 - 02/21 0700   P.O.     I.V. (mL/kg)     IV Piggyback     Total Intake(mL/kg)     Urine (mL/kg/hr)     Blood     Total Output       Net               LABORATORY DATA:  Recent Labs  04/23/14 1002 04/24/14 0740 04/26/14 0650 04/27/14 0555  WBC 12.9* 11.8* 9.0 9.7  HGB 15.1* 13.3 12.8 13.0  HCT 46.1* 40.4 39.3 39.5  PLT 136* 138* 133* 147*    Recent Labs  04/23/14 1002 04/24/14 0740 04/26/14 0650  NA 139 137 136  K 4.2 3.8 3.4*  CL 105 105 101  CO2 27 29 32  BUN 15 19 10   CREATININE 0.92 1.15* 0.97  GLUCOSE 183* 112* 107*  CALCIUM 9.1 8.5 8.3*   Lab Results  Component Value Date   INR 1.30 04/27/2014   INR 1.24 04/26/2014   INR 1.42 04/24/2014    Examination:  General appearance: alert, cooperative and no distress Extremities: Homans sign is negative, no sign of DVT  Wound Exam: clean, dry, intact   Drainage:  None: wound tissue dry  Motor Exam: EHL and FHL Intact  Sensory Exam: Deep Peroneal normal   Assessment:    2 Days Post-Op  Procedure(s)  (LRB): ARTHROPLASTY BIPOLAR HIP (Right)  ADDITIONAL DIAGNOSIS:  Principal Problem:   Femoral neck fracture Active Problems:   Essential hypertension   Atrial fibrillation   Osteoporosis   Hip fracture due to osteoporosis  Acute Blood Loss Anemia   Plan: Physical Therapy as ordered Weight Bearing as Tolerated (WBAT)    DISCHARGE PLAN: per medicine         Ladanian Kelter 04/27/2014, 9:53 AM

## 2014-04-27 NOTE — Progress Notes (Signed)
[facility] PROGRESS NOTE  Amanda [name] ZOX:[id number] DOB: 15-Dec-1922 DOA: 04/23/2014 PCP: Herb Grays, MD  Assessment/Plan:     Femoral neck fracture: s/p surgery Constipation- enema. PRN suppository Hernia- reducible   Essential hypertension   Atrial fibrillation: coumadin resumed   Osteoporosis  Hypokalemia -replete  Code Status:  full Family Communication:  Daughter at bedside Disposition Plan:  PT eval- family now does not think they can take her home- social work consult  Consultants:  orthopedics  Procedures:     Antibiotics:    HPI/Subjective: Eating better No BM yet  Objective: Filed Vitals:   04/27/14 0456  BP: 133/71  Pulse: 84  Temp: 97.8 F (36.6 C)  Resp:    No intake or output data in the 24 hours ending 04/27/14 1019 Filed Weights   04/23/14 0942  Weight: 56.7 kg (125 lb)    Exam:   General:  A and o, comfortable  Cardiovascular: irreg irreg  Respiratory: CTA without WRR  Abdomen: S, NT, ND  Ext: no CCE  Basic Metabolic Panel:  Recent Labs Lab 04/23/14 1002 04/24/14 0740 04/26/14 0650  NA 139 137 136  K 4.2 3.8 3.4*  CL 105 105 101  CO2 27 29 32  GLUCOSE 183* 112* 107*  BUN CREATININE 0.92 1.15* 0.97  CALCIUM 9.1 8.5 8.3*   Liver Function Tests:  Recent Labs Lab 04/23/14 1002  AST 40*  ALT 21  ALKPHOS 65  BILITOT 1.4*  PROT 8.2  ALBUMIN 4.2   No results for input(s): LIPASE, AMYLASE in the last 168 hours. No results for input(s): AMMONIA in the last 168 hours. CBC:  Recent Labs Lab 04/23/14 1002 04/24/14 0740 04/26/14 0650 04/27/14 0555  WBC 12.9* 11.8* 9.0 9.7  HGB 15.1* 13.3 12.8 13.0  HCT 46.1* 40.4 39.3 39.5  MCV 94.7 92.9 92.5 92.9  PLT 136* 138* 133* 147*   Cardiac Enzymes: No results for input(s): CKTOTAL, CKMB, CKMBINDEX, TROPONINI in the last 168 hours. BNP (last 3 results) No results for input(s): BNP in the last 8760 hours.  ProBNP (last 3 results) No  results for input(s): PROBNP in the last 8760 hours.  CBG: No results for input(s): GLUCAP in the last 168 hours.  Recent Results (from the past 240 hour(s))  Surgical pcr screen     Status: Abnormal   Collection Time: 04/24/14  9:13 PM  Result Value Ref Range Status   MRSA, PCR NEGATIVE NEGATIVE Final   Staphylococcus aureus POSITIVE (A) NEGATIVE Final    Comment:        The Xpert SA Assay (FDA approved for NASAL specimens in patients over 66 years of age), is one component of a comprehensive surveillance program.  Test performance has been validated by [facility] for patients greater than or equal to 24 year old. It is not intended to diagnose infection nor to guide or monitor treatment. RESULT CALLED TO, READ BACK BY AND VERIFIED WITH: Jacqulyn Cane RN [phone] GREEN R      Studies: Pelvis Portable  04/25/2014   CLINICAL DATA:  81 year old female with history of right hip arthroplasty.  EXAM: PORTABLE PELVIS 1-2 VIEWS  COMPARISON:  No priors.  FINDINGS: Two views of the pelvis and right hip demonstrate postoperative changes of right hip hemiarthroplasty. The femoral component appears properly seated without periprosthetic fracture or other immediate complicating features. The prosthetic femoral head appears located within the acetabulum on both views. Visualized portions of the bony pelvis and left  proximal femur are unremarkable. Moderate degenerative changes of osteoarthritis in the left hip joint.  IMPRESSION: 1. Postoperative changes of right hip hemiarthroplasty without acute complicating features, as above.   Electronically Signed   By: Trudie Reedaniel  Entrikin M.D.   On: 04/25/2014 18:23    Scheduled Meds: . Chlorhexidine Gluconate Cloth  6 each Topical Daily  . docusate sodium  100 mg Oral BID  . feeding supplement (ENSURE COMPLETE)  237 mL Oral BID BM  . levothyroxine  75 mcg Oral QAC breakfast  . magnesium citrate  1 Bottle Oral Once  . metoprolol tartrate  25 mg Oral BID   . mupirocin ointment  1 application Nasal BID  . pravastatin  40 mg Oral Daily  . sodium chloride  3 mL Intravenous Q12H  . Warfarin - Pharmacist Dosing Inpatient   Does not apply q1800   Continuous Infusions: . sodium chloride 50 mL/hr at 04/25/14 2120  . lactated ringers 10 mL/hr at 04/25/14 1028    Time spent: 25 minutes  Marianne Golightly  [facility] [email or web address], password [email or web address] 04/27/2014, 10:19 AM  LOS: 4 days

## 2014-04-27 NOTE — Progress Notes (Signed)
Physical Therapy Treatment Patient Details Name: Amanda MulletHallie Bowring MRN: [id number] DOB: 12/30/1922 Today's Date: 04/27/2014    History of Present Illness Pt is a 81 y.o. female who fell and suffered femoral neck fx; pt is s/p Rt THA.     PT Comments    Pt very pleasant & willing to participate in PT session.  Ambulated ~8' with RW + min assist for balance but required strong encouragement to increase distance as pt very anxious.   Spoke with pt's daughter who is questioning whether discharging home vs SNF most appropriate.  Cont with POC and will make adjustments to f/u recommendations if appropriate.     Follow Up Recommendations  Home health PT;Supervision/Assistance - 24 hour     Equipment Recommendations  Rolling walker with 5" wheels;3in1 (PT)    Recommendations for Other Services OT consult     Precautions / Restrictions Precautions Precautions: Fall Restrictions Weight Bearing Restrictions: Yes RLE Weight Bearing: Weight bearing as tolerated    Mobility  Bed Mobility Overal bed mobility: Needs Assistance Bed Mobility: Supine to Sit     Supine to sit: Mod assist     General bed mobility comments: HOB flat.  Mod assist to elevate shoulders/trunk to sitting upright & pivot hips/LE's around to EOB.  Cues for use of UE's & technique.    Transfers Overall transfer level: Needs assistance Equipment used: Rolling walker (2 wheeled) Transfers: Sit to/from Stand Sit to Stand: Min assist         General transfer comment: cues for hand placement & technique.  Pt anxious & fearful.  (A) to achieve standing & balance.    Ambulation/Gait Ambulation/Gait assistance: Min assist Ambulation Distance (Feet): 8 Feet Assistive device: Rolling walker (2 wheeled) Gait Pattern/deviations: Step-to pattern;Decreased step length - right;Decreased step length - left;Decreased weight shift to right     General Gait Details: Pt very anxious & fearful of falling.  Max encouragement to  increase distance.  Pt able to advance LE's independently.     Stairs            Wheelchair Mobility    Modified Rankin (Stroke Patients Only)       Balance                                    Cognition Arousal/Alertness: Awake/alert Behavior During Therapy: WFL for tasks assessed/performed Overall Cognitive Status: Within Functional Limits for tasks assessed                      Exercises Low Level/ICU Exercises Ankle Circles/Pumps: AROM;Both;10 reps    General Comments        Pertinent Vitals/Pain Pain Assessment: 0-10 Pain Score: 8  Pain Location: Rt hip Pain Descriptors / Indicators: Aching Pain Intervention(s): Monitored during session;Premedicated before session;Repositioned;Limited activity within patient's tolerance    Home Living                      Prior Function            PT Goals (current goals can now be found in the care plan section) Acute Rehab PT Goals Patient Stated Goal: to go home PT Goal Formulation: With patient Time For Goal Achievement: 05/03/14 Potential to Achieve Goals: Good Progress towards PT goals: Progressing toward goals    Frequency  Min 5X/week    PT Plan Current plan remains appropriate  Co-evaluation             End of Session   Activity Tolerance: Patient limited by pain;Other (comment) (anxiety) Patient left: in chair;with call bell/phone within reach;with family/visitor present     Time: 1101-1116 PT Time Calculation (min) (ACUTE ONLY): 15 min  Charges:  $Therapeutic Activity: 8-22 mins                    G Codes:      Lara Mulch 04/27/2014, 11:21 AM   Verdell Face, PTA [phone] 04/27/2014

## 2014-04-27 NOTE — Progress Notes (Signed)
ANTICOAGULATION CONSULT NOTE - Follow Up Consult  Pharmacy Consult for coumadin Indication: atrial fibrillation  Allergies  Allergen Reactions  . Aspirin Diarrhea    REACTION: black tarry stools  . Nsaids Other (See Comments)    Patient has ulcer   . Codeine Other (See Comments)    REACTION: GI upset  . Sulfonamide Derivatives Rash    Patient Measurements: Height: 5\' 6"  (167.6 cm) Weight: 125 lb (56.7 kg) IBW/kg (Calculated) : 59.3 Heparin Dosing Weight:   Vital Signs: Temp: 97.8 F (36.6 C) (02/20 0456) Temp Source: Oral (02/20 0456) BP: 133/71 mmHg (02/20 0456) Pulse Rate: 84 (02/20 0456)  Labs:  Recent Labs  04/26/14 0650 04/27/14 0555  HGB 12.8 13.0  HCT 39.3 39.5  PLT 133* 147*  LABPROT 15.8* 16.3*  INR 1.24 1.30  CREATININE 0.97  --     Estimated Creatinine Clearance: 35.2 mL/min (by C-G formula based on Cr of 0.97).   Medications:  Scheduled:  . Chlorhexidine Gluconate Cloth  6 each Topical Daily  . docusate sodium  100 mg Oral BID  . feeding supplement (ENSURE COMPLETE)  237 mL Oral BID BM  . levothyroxine  75 mcg Oral QAC breakfast  . metoprolol tartrate  25 mg Oral BID  . mupirocin ointment  1 application Nasal BID  . pravastatin  40 mg Oral Daily  . sodium chloride  3 mL Intravenous Q12H  . Warfarin - Pharmacist Dosing Inpatient   Does not apply q1800   Infusions:  . sodium chloride 50 mL/hr at 04/25/14 2120  . lactated ringers 10 mL/hr at 04/25/14 1028    Assessment: 81 yo female with hx of afib s/p hip surgery is currently on subtherapeutic coumadin.  INR today is 1.3.  Patient was actually on 2.5 mg MWF and 5 mg TTSS but admitting INR was supratherapeutic.  Goal of Therapy:  INR 2-3 Monitor platelets by anticoagulation protocol: Yes   Plan:  - coumadin 2.5 mg po x1 - INR in am  Debralee Braaksma, Tsz-Yin 04/27/2014,10:43 AM

## 2014-04-27 NOTE — Clinical Social Work Note (Signed)
CSW received consult for SNF. CSW reviewed patient's chart and PT recommendation for HHPT. No further needs. CSW signing off.   Namari Breton Patrick-Jefferson, LCSWA Weekend Clinical Social Worker [phone]

## 2014-04-28 DIAGNOSIS — K5901 Slow transit constipation: Secondary | ICD-10-CM

## 2014-04-28 LAB — CBC
HCT: 39.6 % (ref 36.0–46.0)
Hemoglobin: 13 g/dL (ref 12.0–15.0)
MCH: 31 pg (ref 26.0–34.0)
MCHC: 32.8 g/dL (ref 30.0–36.0)
MCV: 94.3 fL (ref 78.0–100.0)
Platelets: 159 10*3/uL (ref 150–400)
RBC: 4.2 MIL/uL (ref 3.87–5.11)
RDW: 13.4 % (ref 11.5–15.5)
WBC: 9.6 10*3/uL (ref 4.0–10.5)

## 2014-04-28 LAB — PROTIME-INR
INR: 1.31 (ref 0.00–1.49)
Prothrombin Time: 16.4 seconds — ABNORMAL HIGH (ref 11.6–15.2)

## 2014-04-28 MED ORDER — WARFARIN SODIUM 5 MG PO TABS
5.0000 mg | ORAL_TABLET | Freq: Once | ORAL | Status: AC
  Administered 2014-04-28: 5 mg via ORAL
  Filled 2014-04-28: qty 1

## 2014-04-28 NOTE — Progress Notes (Signed)
Occupational Therapy Treatment Patient Details Name: Amanda [name] MRN: [id number] DOB: 04/25/1922 Today's Date: 04/28/2014    History of present illness Pt is a 81 y.o. female who fell and suffered femoral neck fx; pt is s/p Rt THA.    OT comments  Pt seen today for functional mobility and ADLs. Pt continues to require Min A for functional mobility and is able to ambulate to bathroom with assistance. Pt's daughter was present for session. They would like pt to be able to go home if possible and feel that this could be an option if pt progresses to Supervision level in the next day or two. However, pt may need SNF for safety if unable to meet these goals. Pt will continue to benefit from acute OT to address ADLs and functional mobility.     Follow Up Recommendations  Home health OT;Supervision/Assistance - 24 hour    Equipment Recommendations  Tub/shower bench;3 in 1 bedside comode    Recommendations for Other Services      Precautions / Restrictions Precautions Precautions: Fall Restrictions Weight Bearing Restrictions: Yes RLE Weight Bearing: Weight bearing as tolerated       Mobility Bed Mobility Overal bed mobility: Needs Assistance Bed Mobility: Supine to Sit     Supine to sit: Min assist     General bed mobility comments: HOB flat. Use of bed rails. Min A to elevate head and barely assist with LEs.   Transfers Overall transfer level: Needs assistance Equipment used: Rolling walker (2 wheeled) Transfers: Sit to/from Stand Sit to Stand: Min assist         General transfer comment: Min A to stand with VC's for hand placement and sequencing. Demonstrated safety concern with hands on RW. Pt did very well with cueing.     Balance Overall balance assessment: Needs assistance Sitting-balance support: No upper extremity supported;Feet supported Sitting balance-Leahy Scale: Fair     Standing balance support: Bilateral upper extremity supported;During functional  activity Standing balance-Leahy Scale: Poor Standing balance comment: UE support on RW needed for dynamic balacen                   ADL Overall ADL's : Needs assistance/impaired     Grooming: Set up;Sitting       Lower Body Bathing: Moderate assistance;Sit to/from stand       Lower Body Dressing: Maximal assistance;Sit to/from stand   Toilet Transfer: Minimal assistance;Ambulation;RW   Toileting- Clothing Manipulation and Hygiene: Minimal assistance;Sit to/from stand       Functional mobility during ADLs: Minimal assistance;Rolling walker General ADL Comments: Pt eager to get OOB, reports she was disappointed she could not work with PT earlier due to fatigue. Pt moving quite well and requires encouragement only for confidence. Pt ambulated to bathroom for toileting then returned to recliner.                 Cognition  Arousal/Alertness:Awake/Alert Behavior During Therapy: WFL for tasks assessed/performed Overall Cognitive Status: Within Functional Limits for tasks assessed                                    Pertinent Vitals/ Pain       Pain Assessment: 0-10 Pain Score: 5  Pain Location: R hip with movement Pain Descriptors / Indicators: Aching Pain Intervention(s): Monitored during session;Repositioned;Ice applied;Premedicated before session         Frequency Min 2X/week  Progress Toward Goals  OT Goals(current goals can now be found in the care plan section)  Progress towards OT goals: Progressing toward goals  Acute Rehab OT Goals Patient Stated Goal: to go home OT Goal Formulation: With patient/family Time For Goal Achievement: 05/03/14 Potential to Achieve Goals: Good  Plan Discharge plan remains appropriate       End of Session Equipment Utilized During Treatment: Gait belt;Rolling walker   Activity Tolerance Patient tolerated treatment well   Patient Left in chair;with call bell/phone within reach;with family/visitor  present   Nurse Communication Mobility status;Other (comment) (pt in recliner chair)        Time: 1610-9604 OT Time Calculation (min): 34 min  Charges: OT General Charges $OT Visit: 1 Procedure OT Treatments $Self Care/Home Management : 23-37 mins  Rae Lips 04/28/2014, 6:11 PM  Carney Living, OTR/L Occupational Therapist [phone] (pager)

## 2014-04-28 NOTE — Progress Notes (Signed)
[facility] PROGRESS NOTE  Amanda [name] Suhr QIH:[id number] DOB: 09/26/1922 DOA: 04/23/2014 PCP: Herb GraysSPEAR, TAMMY, MD   Amanda [name] Amanda [name] is a 81 y.o. female with A-fib, HTN, HLP comes in after tripping and falling. She has right hip pain on movement and is found to have a right femoral neck fracture. The history is mostly obtained from her son who is at bedside. The patient remains mostly quiet.  She is being admitted for surgery. She is on Coumadin for A-fib and INR is elevated today which is preventing surgery today. She has received Vit K IV. The orthopedic surgeon would like to be [facility] for the [facility]. The patient has no other complaints. No h/o chest pain or dyspnea on exertion. No h/o palpitations.   Assessment/Plan:   Femoral neck fracture: s/p surgery Constipation- enema. PRN suppository Hernia- reducible   Essential hypertension   Atrial fibrillation: coumadin resumed   Osteoporosis  Hypokalemia -replete  Code Status:  full Family Communication:  Daughter at bedside Disposition Plan:  PT eval- family now does not think they can take her home- social work consult called AGAIN  Consultants:  orthopedics  Procedures:     Antibiotics:    HPI/Subjective: Had several BMs  Objective: Filed Vitals:   04/28/14 0538  BP: 124/88  Pulse: 86  Temp: 98.7 F (37.1 C)  Resp: 18    Intake/Output Summary (Last 24 hours) at 04/28/14 0924 Last data filed at 04/28/14 0805  Gross per 24 hour  Intake    650 ml  Output      0 ml  Net    650 ml   Filed Weights   04/23/14 0942  Weight: 56.7 kg (125 lb)    Exam:   General:  A and o, comfortable  Cardiovascular: irreg irreg  Respiratory: CTA without WRR  Abdomen: S, NT, ND  Ext: no CCE  Basic Metabolic Panel:  Recent Labs Lab 04/23/14 1002 04/24/14 0740 04/26/14 0650  NA 139 137 136  K 4.2 3.8 3.4*  CL 105 105 101  CO2 27 29 32  GLUCOSE 183* 112* 107*  BUN 15 19 10   CREATININE 0.92  1.15* 0.97  CALCIUM 9.1 8.5 8.3*   Liver Function Tests:  Recent Labs Lab 04/23/14 1002  AST 40*  ALT 21  ALKPHOS 65  BILITOT 1.4*  PROT 8.2  ALBUMIN 4.2   No results for input(s): LIPASE, AMYLASE in the last 168 hours. No results for input(s): AMMONIA in the last 168 hours. CBC:  Recent Labs Lab 04/23/14 1002 04/24/14 0740 04/26/14 0650 04/27/14 0555 04/28/14 0617  WBC 12.9* 11.8* 9.0 9.7 9.6  HGB 15.1* 13.3 12.8 13.0 13.0  HCT 46.1* 40.4 39.3 39.5 39.6  MCV 94.7 92.9 92.5 92.9 94.3  PLT 136* 138* 133* 147* 159   Cardiac Enzymes: No results for input(s): CKTOTAL, CKMB, CKMBINDEX, TROPONINI in the last 168 hours. BNP (last 3 results) No results for input(s): BNP in the last 8760 hours.  ProBNP (last 3 results) No results for input(s): PROBNP in the last 8760 hours.  CBG: No results for input(s): GLUCAP in the last 168 hours.  Recent Results (from the past 240 hour(s))  Surgical pcr screen     Status: Abnormal   Collection Time: 04/24/14  9:13 PM  Result Value Ref Range Status   MRSA, PCR NEGATIVE NEGATIVE Final   Staphylococcus aureus POSITIVE (A) NEGATIVE Final    Comment:        The Xpert SA Assay (FDA  approved for NASAL specimens in patients over 29 years of age), is one component of a comprehensive surveillance program.  Test performance has been validated by [facility] for patients greater than or equal to 61 year old. It is not intended to diagnose infection nor to guide or monitor treatment. RESULT CALLED TO, READ BACK BY AND VERIFIED WITH: Jacqulyn Cane RN [phone] GREEN R      Studies: No results found.  Scheduled Meds: . Chlorhexidine Gluconate Cloth  6 each Topical Daily  . docusate sodium  100 mg Oral BID  . feeding supplement (ENSURE COMPLETE)  237 mL Oral BID BM  . levothyroxine  75 mcg Oral QAC breakfast  . metoprolol tartrate  25 mg Oral BID  . mupirocin ointment  1 application Nasal BID  . pravastatin  40 mg Oral Daily  .  sodium chloride  3 mL Intravenous Q12H  . Warfarin - Pharmacist Dosing Inpatient   Does not apply q1800   Continuous Infusions: . sodium chloride 50 mL/hr at 04/25/14 2120  . lactated ringers 10 mL/hr at 04/25/14 1028    Time spent: 25 minutes  Shaleta Ruacho  [facility] [email or web address], password [facility] 04/28/2014, 9:24 AM  LOS: 5 days

## 2014-04-28 NOTE — Progress Notes (Addendum)
SPORTS MEDICINE AND JOINT REPLACEMENT  Georgena SpurlingStephen Lucey, MD   Altamese CabalMaurice Nellie Chevalier, PA-C [address], [place], [place]  [id number]                             [phone]   PROGRESS NOTE  Subjective:  negative for Chest Pain  negative for Shortness of Breath  negative for Nausea/Vomiting   negative for Calf Pain  negative for Bowel Movement   Tolerating Diet: yes         Patient reports pain as 5 on 0-10 scale.    Objective: Vital signs in last 24 hours:   Patient Vitals for the past 24 hrs:  BP Temp Temp src Pulse Resp SpO2  04/28/14 0538 124/88 mmHg 98.7 F (37.1 C) Oral 86 18 96 %  04/28/14 0000 - - - - 18 97 %  04/27/14 2000 - - - - 18 97 %  04/27/14 1959 (!) 156/77 mmHg 98.8 F (37.1 C) Oral 92 18 97 %  04/27/14 1500 - 97.1 F (36.2 C) - - - -  04/27/14 1321 (!) 150/63 mmHg - Oral 77 18 96 %    @flow {1959:LAST@   Intake/Output from previous day:   02/20 0701 - 02/21 0700 In: 610 [P.O.:610] Out: -    Intake/Output this shift:   02/21 0701 - 02/21 1900 In: 240 [P.O.:240] Out: -    Intake/Output      02/20 0701 - 02/21 0700 02/21 0701 - 02/22 0700   P.O. 610 240   Total Intake(mL/kg) 610 (10.8) 240 (4.2)   Net +610 +240        Urine Occurrence 6 x    Stool Occurrence 4 x       LABORATORY DATA:  Recent Labs  04/23/14 1002 04/24/14 0740 04/26/14 0650 04/27/14 0555 04/28/14 0617  WBC 12.9* 11.8* 9.0 9.7 9.6  HGB 15.1* 13.3 12.8 13.0 13.0  HCT 46.1* 40.4 39.3 39.5 39.6  PLT 136* 138* 133* 147* 159    Recent Labs  04/23/14 1002 04/24/14 0740 04/26/14 0650  NA 139 137 136  K 4.2 3.8 3.4*  CL 105 105 101  CO2 27 29 32  BUN 15 19 10   CREATININE 0.92 1.15* 0.97  GLUCOSE 183* 112* 107*  CALCIUM 9.1 8.5 8.3*   Lab Results  Component Value Date   INR 1.31 04/28/2014   INR 1.30 04/27/2014   INR 1.24 04/26/2014    Examination:  General appearance: alert, cooperative and no distress Extremities: Homans sign is negative, no sign of  DVT  Wound Exam: clean, dry, intact   Drainage:  None: wound tissue dry  Motor Exam: EHL and FHL Intact  Sensory Exam: Deep Peroneal normal   Assessment:    3 Days Post-Op  Procedure(s) (LRB): ARTHROPLASTY BIPOLAR HIP (Right)  ADDITIONAL DIAGNOSIS:  Principal Problem:   Femoral neck fracture Active Problems:   Essential hypertension   Atrial fibrillation   Osteoporosis   Hip fracture due to osteoporosis  Acute Blood Loss Anemia   Plan: Physical Therapy as ordered Weight Bearing as Tolerated (WBAT)  SNF vs HHPT per medicine  Patient had a rough night with impaction and diarrhea.  It is suggested by ortho in agreement with family that patient consider short term rehab at skilled nursing facility.  24/7 care is available at home but is not believed to be sufficient care.  I will place an order to reopen SNF placement as  it seems to be closed at this time.         Zell Doucette 04/28/2014, 9:32 AM

## 2014-04-28 NOTE — Clinical Social Work Psychosocial (Signed)
Clinical Social Work Department BRIEF PSYCHOSOCIAL ASSESSMENT 04/28/2014  Patient:  Amanda [name], Amanda [name]     Account Number:  [id number]     Admit date:  04/23/2014  Clinical Social Worker:  Hubert Azure  Date/Time:  04/28/2014 05:11 PM  Referred by:  Physician  Date Referred:  04/28/2014 Referred for  SNF Placement   Other Referral:   Interview type:  Family Other interview type:   Patient's daughter and son were present at bedside along with husband.    PSYCHOSOCIAL DATA Living Status:  HUSBAND Admitted from facility:   Level of care:   Primary support name:  Amanda [name] [phone]) Amanda [name] [phone]) Primary support relationship to patient:  CHILD, ADULT Degree of support available:   Good. Patient's son resides with patient and husband in the home.    CURRENT CONCERNS Current Concerns  Post-Acute Placement   Other Concerns:    SOCIAL WORK ASSESSMENT / PLAN CSW met with patient's daughter and son along with husband who was present at bedside. CSW introduced self and explained role. CSW explained SNF placement process and discussed d/c plan. Per daughter, patient has been very independent prior to hospitalization. daughter states although patient is very active and has son Amanda [name] to assist with management at home, patient would "not want my dad or brother assisting her to the bathroom, and I cannot come at night just to help her get to the bathroom." Daughter states family is agreeable to SNF placement at [facility] and have spoken with an admissions coordinator at the facility.   Assessment/plan status:  Other - See comment Other assessment/ plan:   CSW to submit PASARR and complete FL2 for placement.   Information/referral to community resources:    PATIENT'S/FAMILY'S RESPONSE TO PLAN OF CARE: Patient's daughter and son state although patient is active and independent, she has not been "up and moving since she got here, actually she's spent most of her  time in the bed." Both state patient will benefit from therapy in a SNF with the appropriate staff versus home. Both state the goal is for patient to get "strong enough to return home and do the things she was doing before she came here."    Carrington Clamp, [place] Weekend Clinical Social Worker [phone]

## 2014-04-28 NOTE — Progress Notes (Signed)
PT Cancellation Note  Patient Details Name: Amanda MulletHallie [name] MRN: [id number] DOB: 04/24/1922   Cancelled Treatment:     Pt's daughter present upon arrival & reports pt had a "bad" night & states pt is exhausted.  Pt session held per pt's request.  Will attempt back later today if time allows.      Verdell FaceKelly Twanna Resh, [place] [phone] 04/28/2014

## 2014-04-28 NOTE — Clinical Social Work Placement (Signed)
Clinical Social Work Department CLINICAL SOCIAL WORK PLACEMENT NOTE 04/28/2014  Patient:  Amanda [name],Amanda [name]  Account Number:  [id number] Admit date:  04/23/2014  Clinical Social Worker:  Vivi Barrackrystal Patrick-jefferson, LCSWA  Date/time:  04/28/2014 05:09 PM   Clinical Social Work is seeking post-discharge placement for this patient at the following level of care:   SKILLED NURSING   (*CSW will update this form in Epic as items are completed)   04/28/2014  Patient/family provided with [facility] Department of Clinical Social Work's list of facilities offering this level of care within the geographic area requested by the patient (or if unable, by the patient's family).  04/28/2014  Patient/family informed of their freedom to choose among providers that offer the needed level of care, that participate in Medicare, Medicaid or managed care program needed by the patient, have an available bed and are willing to accept the patient.  04/28/2014  Patient/family informed of [facility]' ownership interest in [facility], as well as of the fact that they are under no obligation to receive care at this facility.  PASARR submitted to EDS on 04/28/2014 PASARR number received on 04/28/2014  FL2 transmitted to all facilities in geographic area requested by pt/family on  04/28/2014 FL2 transmitted to all facilities within larger geographic area on   Patient informed that his/her managed care company has contracts with or will negotiate with  certain facilities, including the following:     Patient/family informed of bed offers received:   Patient chooses bed at  Physician recommends and patient chooses bed at    Patient to be transferred to  on   Patient to be transferred to facility by  Patient and family notified of transfer on  Name of family member notified:    The following physician request were entered in Epic:   Additional Comments:  Davene Jobin Patrick-Jefferson, LCSWA Weekend  Clinical Social Worker [phone]

## 2014-04-28 NOTE — Progress Notes (Signed)
ANTICOAGULATION CONSULT NOTE - Follow Up Consult  Pharmacy Consult for coumadin Indication: afib s/p hip surgery  Allergies  Allergen Reactions  . Aspirin Diarrhea    REACTION: black tarry stools  . Nsaids Other (See Comments)    Patient has ulcer   . Codeine Other (See Comments)    REACTION: GI upset  . Sulfonamide Derivatives Rash    Patient Measurements: Height: 5\' 6"  (167.6 cm) Weight: 125 lb (56.7 kg) IBW/kg (Calculated) : 59.3 Heparin Dosing Weight:   Vital Signs: Temp: 98.7 F (37.1 C) (02/21 0538) Temp Source: Oral (02/21 0538) BP: 124/88 mmHg (02/21 0538) Pulse Rate: 86 (02/21 0538)  Labs:  Recent Labs  04/26/14 0650 04/27/14 0555 04/28/14 0617  HGB 12.8 13.0 13.0  HCT 39.3 39.5 39.6  PLT 133* 147* 159  LABPROT 15.8* 16.3* 16.4*  INR 1.24 1.30 1.31  CREATININE 0.97  --   --     Estimated Creatinine Clearance: 35.2 mL/min (by C-G formula based on Cr of 0.97).   Medications:  Scheduled:  . Chlorhexidine Gluconate Cloth  6 each Topical Daily  . docusate sodium  100 mg Oral BID  . feeding supplement (ENSURE COMPLETE)  237 mL Oral BID BM  . levothyroxine  75 mcg Oral QAC breakfast  . metoprolol tartrate  25 mg Oral BID  . mupirocin ointment  1 application Nasal BID  . pravastatin  40 mg Oral Daily  . sodium chloride  3 mL Intravenous Q12H  . Warfarin - Pharmacist Dosing Inpatient   Does not apply q1800   Infusions:  . lactated ringers 10 mL/hr at 04/25/14 1028    Assessment: 81 yo female with afib s/p hip surgery is currently on subtherapeutic coumadin.  INR today is 1.31 from 1.3.   Goal of Therapy:  INR 2-3 Monitor platelets by anticoagulation protocol: Yes   Plan:  Warfarin 5mg  tonight x 1  Daily INR Continue to monitor H&H and platelets   Shyloh Krinke, Tsz-Yin 04/28/2014,11:23 AM

## 2014-04-28 NOTE — Progress Notes (Signed)
Pt having abdominal pain last night and straining to go to the bathroom. Administered simethicone and enema. Pt did not excrete a moderate amount of stool following enema but stated that she feels so much better and is not in any pain. At 0800, pt was sitting up in bed, said she had just finished breakfast and felt good. Day nurse is going to give her prn miralax.  Stool was a slimy green and pt was still having a hard time excreting stool after enema. Would appreciate if MD would assess abdomen and just follow up to make sure all is well. Bowel sounds are active. Prior to enema, lower left and right quads of abdomen were more firm than upper quads.

## 2014-04-29 LAB — BASIC METABOLIC PANEL
Anion gap: 4 — ABNORMAL LOW (ref 5–15)
BUN: 13 mg/dL (ref 6–23)
CO2: 31 mmol/L (ref 19–32)
Calcium: 8.3 mg/dL — ABNORMAL LOW (ref 8.4–10.5)
Chloride: 101 mmol/L (ref 96–112)
Creatinine, Ser: 0.9 mg/dL (ref 0.50–1.10)
GFR calc Af Amer: 64 mL/min — ABNORMAL LOW (ref >90)
GFR, EST NON AFRICAN AMERICAN: 55 mL/min — AB (ref >90)
Glucose, Bld: 105 mg/dL — ABNORMAL HIGH (ref 70–99)
POTASSIUM: 4 mmol/L (ref 3.5–5.1)
Sodium: 136 mmol/L (ref 135–145)

## 2014-04-29 LAB — CBC
HCT: 38 % (ref 36.0–46.0)
HEMOGLOBIN: 12.2 g/dL (ref 12.0–15.0)
MCH: 30.2 pg (ref 26.0–34.0)
MCHC: 32.1 g/dL (ref 30.0–36.0)
MCV: 94.1 fL (ref 78.0–100.0)
PLATELETS: 183 10*3/uL (ref 150–400)
RBC: 4.04 MIL/uL (ref 3.87–5.11)
RDW: 13.5 % (ref 11.5–15.5)
WBC: 8.2 10*3/uL (ref 4.0–10.5)

## 2014-04-29 LAB — PROTIME-INR
INR: 1.48 (ref 0.00–1.49)
Prothrombin Time: 18 seconds — ABNORMAL HIGH (ref 11.6–15.2)

## 2014-04-29 MED ORDER — ENSURE COMPLETE PO LIQD: 237.0000 mL | Freq: Two times a day (BID) | ORAL | Status: DC

## 2014-04-29 MED ORDER — POLYETHYLENE GLYCOL 3350 17 G PO PACK: 17.0000 g | PACK | Freq: Every day | ORAL | Status: DC | PRN

## 2014-04-29 MED ORDER — FLEET ENEMA 7-19 GM/118ML RE ENEM: 1.0000 | ENEMA | Freq: Every day | RECTAL | Status: DC | PRN

## 2014-04-29 MED ORDER — SIMETHICONE 80 MG PO CHEW: 80.0000 mg | CHEWABLE_TABLET | Freq: Four times a day (QID) | ORAL | Status: DC | PRN

## 2014-04-29 NOTE — Care Management Note (Signed)
    Page 1 of 2   04/29/2014     1:58:33 PM CARE MANAGEMENT NOTE 04/29/2014  Patient:  Amanda [name], Amanda [name]   Account Number:  [id number]  Date Initiated:  04/26/2014  Documentation initiated by:  [facility]  Subjective/Objective Assessment:   fell, rt femoral neck fracture,s/p rt hip hemi-arthroplasty     Action/Plan:   PT/OT evals-recommended HHPT   Anticipated DC Date:  04/27/2014   Anticipated DC Plan:  [place]  CM consult      [facility] Choice  [place]   Choice offered to / List presented to:  C-4 Adult Children   DME arranged  3-N-1  Vassie Moselle      DME agency  [place].        Status of service:  In process, will continue to follow Medicare Important Message given?  YES (If response is "NO", the following Medicare IM given date fields will be blank) Date Medicare IM given:  04/26/2014 Medicare IM given by:  [facility] Date Additional Medicare IM given:  04/29/2014 Additional Medicare IM given by:  Lorne Skeens  Discharge Disposition:  [place]  Per UR Regulation:  Reviewed for med. necessity/level of care/duration of stay  If discussed at [place] of Stay Meetings, dates discussed:    Comments:  04/29/14 [place], MSN, CM- Additional Medicare IM letter provided.   04/27/2014 1525 - Frann Rider, RN, BSN  Met with pt, husband and son. Son agrees with SNF if prn. He can assist her at home 24/7 if she is d/c home. Explained SW referral in case she is not able to return home. Will continue to f/u.   04/26/14 Spoke with patient and her son about HHC. Son stated that he would like to discuss home health agencies with other family members.Gave him [facility] list of home health agencies. Son stated that they do not have a rolling walker or 3N1. Contacted Frank wit [facility] and requested rolling walker and 3N1 be delivered to  patient's room. CM will continue to follow to set up [place].

## 2014-04-29 NOTE — Progress Notes (Signed)
ANTICOAGULATION CONSULT NOTE - Follow Up Consult  Pharmacy Consult:  coumadin Indication:  Afib + VTE prophylaxis s/p hip surgery  Allergies  Allergen Reactions  . Aspirin Diarrhea    REACTION: black tarry stools  . Nsaids Other (See Comments)    Patient has ulcer   . Codeine Other (See Comments)    REACTION: GI upset  . Sulfonamide Derivatives Rash    Patient Measurements: Height: 5\' 6"  (167.6 cm) Weight: 125 lb (56.7 kg) IBW/kg (Calculated) : 59.3  Vital Signs: Temp: 97.7 F (36.5 C) (02/22 0602) Temp Source: Oral (02/22 0602) BP: 137/84 mmHg (02/22 0602) Pulse Rate: 65 (02/22 0602)  Labs:  Recent Labs  04/27/14 0555 04/28/14 0617 04/29/14 0444  HGB 13.0 13.0 12.2  HCT 39.5 39.6 38.0  PLT 147* 159 183  LABPROT 16.3* 16.4* 18.0*  INR 1.30 1.31 1.48  CREATININE  --   --  0.90    Estimated Creatinine Clearance: 37.9 mL/min (by C-G formula based on Cr of 0.9).    Assessment: 89 YOF to continue on Coumadin for history of Afib and VTE prophylaxis.  INR remains sub-therapeutic as expected.  No bleeding reported.  Noted plan for discharge.   Goal of Therapy:  INR 2-3    Plan:  - Coumadin 4mg  PO today if not discharged - Daily PT / INR   Renetta Suman D. Laney Potashang, PharmD, BCPS Pager:  [phone] 04/29/2014, 11:08 AM

## 2014-04-29 NOTE — Progress Notes (Signed)
D/C report given to Diane @ [facility] skilled [facility] facility including Dx's with Rt hip precautions, medications and last Coumadin dose, this AM INR.

## 2014-04-29 NOTE — Progress Notes (Signed)
OT Cancellation Note  Patient Details Name: Amanda [name] MRN: [id number] DOB: 08/12/1922   Cancelled Treatment:    Reason Eval/Treat Not Completed: Other (comment)pt. States she just returned from the b.room and does not feel like getting up again right now.  Son and husband present and state that pt. Is going to [facility] this afternoon, and agree with pt. Wanting to rest right now.    Robet LeuMorris, Eleisha Branscomb [place], COTA/L 04/29/2014, 10:27 AM

## 2014-04-29 NOTE — Progress Notes (Signed)
Physical Therapy Treatment Patient Details Name: Amanda [name] MRN: [id number] DOB: 02/04/1923 Today's Date: 04/29/2014    History of Present Illness Pt is a 81 y.o. female who fell and suffered femoral neck fx; pt is s/p Rt THA.     PT Comments    Pt with no recall of post hip precautions and has limited ambulation tolerance due to bilat UE pain and R LE pain. Pt unsafe to return home due to pt currently requiring assist for safe mobility and pt not wanted spouse or son to assist her in bathroom. Pt to benefit from ST-SNF to allow for maximal functional recovery for safe transition home.   Follow Up Recommendations  SNF;Supervision/Assistance - 24 hour     Equipment Recommendations  Rolling walker with 5" wheels;3in1 (PT)    Recommendations for Other Services       Precautions / Restrictions Precautions Precautions: Fall Restrictions Weight Bearing Restrictions: Yes RLE Weight Bearing: Weight bearing as tolerated    Mobility  Bed Mobility                  Transfers Overall transfer level: Needs assistance Equipment used: Rolling walker (2 wheeled) Transfers: Sit to/from Stand Sit to Stand: Min assist         General transfer comment: Min A to stand with VC's for hand placement and sequencing. Demonstrated safety concern with hands on RW. Pt did very well with cueing.   Ambulation/Gait Ambulation/Gait assistance: Min assist Ambulation Distance (Feet): 50 Feet Assistive device: Rolling walker (2 wheeled) Gait Pattern/deviations: Step-to pattern     General Gait Details: max encouragement to increase ambulation duration. pt with increased trunk flexion and UE WBing due to dec R LE WBing. pt took 2 standing rest breaks due to UE fatigue.   Stairs            Wheelchair Mobility    Modified Rankin (Stroke Patients Only)       Balance           Standing balance support: Bilateral upper extremity supported Standing balance-Leahy Scale: Poor                      Cognition Arousal/Alertness: Awake/alert Behavior During Therapy: WFL for tasks assessed/performed Overall Cognitive Status: Within Functional Limits for tasks assessed                      Exercises Total Joint Exercises Ankle Circles/Pumps: AROM;Both;10 reps Gluteal Sets: AROM;Both;10 reps Long Arc Quad: AROM;Both;20 reps Marching in Standing: AROM;Right;10 reps    General Comments        Pertinent Vitals/Pain Pain Assessment: Faces Pain Score:  (didn't rate) Faces Pain Scale: Hurts even more Pain Location: R hip and UEs during amb Pain Descriptors / Indicators: Aching Pain Intervention(s): Monitored during session    Home Living                      Prior Function            PT Goals (current goals can now be found in the care plan section) Acute Rehab PT Goals Patient Stated Goal: to go home Progress towards PT goals: Progressing toward goals    Frequency  Min 5X/week    PT Plan Change d/c plans     Co-evaluation             End of Session Equipment Utilized During Treatment: Gait belt Activity Tolerance: Patient limited  by pain;Other (comment) Patient left: in chair;with call bell/phone within reach;with family/visitor present     Time: 1340-1355 PT Time Calculation (min) (ACUTE ONLY): 15 min  Charges:  $Gait Training: 8-22 mins                    G Codes:      Marcene Brawn 04/29/2014, 2:06 PM   Lewis Shock, PT, DPT Pager #: [phone] Office #: [phone]

## 2014-04-29 NOTE — Discharge Summary (Addendum)
Physician Discharge Summary  Amanda [name] ZOX:[id number] DOB: 1922/06/08 DOA: 04/23/2014  PCP: Herb Grays, MD  Admit date: 04/23/2014 Discharge date: 04/29/2014  Time spent: 35 minutes  Recommendations for Outpatient Follow-up:  To SNF Bowel regimen Monitor INR periodically Follow up with surgery for hernia  Discharge Diagnoses:  Principal Problem:   Femoral neck fracture Active Problems:   Essential hypertension   Atrial fibrillation   Osteoporosis   Hip fracture due to osteoporosis   Discharge Condition: improved  Diet recommendation: regular  Filed Weights   04/23/14 0942  Weight: 56.7 kg (125 lb)    History of present illness:  Amanda [name] is a 81 y.o. female with A-fib, HTN, HLP comes in after tripping and falling. She has right hip pain on movement and is found to have a right femoral neck fracture. The history is mostly obtained from her son who is at bedside. The patient remains mostly quiet.  She is being admitted for surgery. She is on Coumadin for A-fib and INR is elevated today which is preventing surgery today. She has received Vit K IV. The orthopedic surgeon would like to be transferred to [facility] for the surgery. The patient has no other complaints. No h/o chest pain or dyspnea on exertion. No h/o palpitations.    Hospital Course:  Femoral neck fracture: s/p surgery Constipation- enema. PRN suppository Hernia- reducible  Essential hypertension  Atrial fibrillation: coumadin resumed  Osteoporosis Hypokalemia -replete  Procedures: ARTHROPLASTY BIPOLAR HIP (Right)  Consultations:  ortho  Discharge Exam: Filed Vitals:   04/29/14 0602  BP: 137/84  Pulse: 65  Temp: 97.7 F (36.5 C)  Resp: 18    General: A+Ox3, NAD Cardiovascular: irr Respiratory: clear  Discharge Instructions   Discharge Instructions    Diet - low sodium heart healthy    Complete by:  As directed      Discharge instructions    Complete by:  As directed    Bowel regimen for daily BMs Follow up with surgery for hernia     Increase activity slowly    Complete by:  As directed      Weight bearing as tolerated    Complete by:  As directed           Current Discharge Medication List    START taking these medications   Details  docusate sodium (COLACE) 100 MG capsule Take 1 capsule (100 mg total) by mouth 2 (two) times daily. Qty: 10 capsule, Refills: 0    feeding supplement, ENSURE COMPLETE, (ENSURE COMPLETE) LIQD Take 237 mLs by mouth 2 (two) times daily between meals.    HYDROcodone-acetaminophen (NORCO) 5-325 MG per tablet Take 1-2 tablets by mouth every 6 (six) hours as needed for moderate pain. Qty: 90 tablet, Refills: 0    ondansetron (ZOFRAN) 4 MG tablet Take 1 tablet (4 mg total) by mouth every 8 (eight) hours as needed for nausea or vomiting. Qty: 20 tablet, Refills: 0    polyethylene glycol (MIRALAX / GLYCOLAX) packet Take 17 g by mouth daily as needed for mild constipation. Qty: 14 each, Refills: 0    simethicone (MYLICON) 80 MG chewable tablet Chew 1 tablet (80 mg total) by mouth every 6 (six) hours as needed for flatulence. Qty: 30 tablet, Refills: 0    sodium phosphate (FLEET) 7-19 GM/118ML ENEM Place 133 mLs (1 enema total) rectally daily as needed for severe constipation. Refills: 0      CONTINUE these medications which have NOT CHANGED   Details  acetaminophen (TYLENOL) 500 MG tablet Take 1,000 mg by mouth every 4 (four) hours as needed for mild pain or headache.    Calcium Carbonate-Vitamin D (CALCIUM 600+D) 600-400 MG-UNIT per tablet Take 1 tablet by mouth 2 (two) times daily.    Cholecalciferol (VITAMIN D3) 5000 UNITS TABS Take 1 tablet by mouth every Monday, Wednesday, and Friday.    Cranberry 500 MG CAPS Take 1 capsule by mouth daily.    folic acid (FOLVITE) 400 MCG tablet Take 400 mcg by mouth daily.      levothyroxine (SYNTHROID, LEVOTHROID) 75 MCG tablet Take 75 mcg by mouth daily.      metoprolol  tartrate (LOPRESSOR) 25 MG tablet TAKE ONE TABLET BY MOUTH TWICE DAILY-MUST BE SEEN Qty: 30 tablet, Refills: 6    Misc Natural Products (TURMERIC CURCUMIN) CAPS Take 1 capsule by mouth daily.      Multiple Vitamins-Minerals (CENTRUM PO) Take 1 tablet by mouth daily.      Omega-3 Fatty Acids (FISH OIL) 1200 MG CAPS Take 1 capsule by mouth 2 (two) times daily.     pravastatin (PRAVACHOL) 40 MG tablet Take 40 mg by mouth daily.     vitamin B-12 (CYANOCOBALAMIN) 500 MCG tablet Take 500 mcg by mouth daily.    warfarin (COUMADIN) 5 MG tablet Take 2.5-5 mg by mouth daily. Takes 2.5 mg on Monday, Wednesday, and Friday Takes 5 mg on Tuesday, Thursday, Saturday, and Sunday       Allergies  Allergen Reactions  . Aspirin Diarrhea    REACTION: black tarry stools  . Nsaids Other (See Comments)    Patient has ulcer   . Codeine Other (See Comments)    REACTION: GI upset  . Sulfonamide Derivatives Rash   Follow-up Information    Follow up with MURPHY, TIMOTHY, D, MD In 1 week.   Specialty:  Orthopedic Surgery   Contact information:   [address] [place] [address] [phone]        The results of significant diagnostics from this hospitalization (including imaging, microbiology, ancillary and laboratory) are listed below for reference.    Significant Diagnostic Studies: Dg Chest 1 View  04/23/2014   CLINICAL DATA:  81 year old female who fell last night with hip fracture. Preoperative study. Initial encounter.  EXAM: CHEST  1 VIEW  COMPARISON:  08/02/2009.  FINDINGS: Portable AP semi upright view at 1018 hours. Stable cardiomegaly and mediastinal contours. Visualized tracheal air column is within normal limits. Stable lung volumes. Allowing for portable technique, the lungs are clear. No acute osseous abnormality identified.  IMPRESSION: Stable cardiomegaly. No acute cardiopulmonary abnormality.   Electronically Signed   By: Odessa Fleming M.D.   On: 04/23/2014 10:34    Pelvis Portable  04/25/2014   CLINICAL DATA:  82 year old female with history of right hip arthroplasty.  EXAM: PORTABLE PELVIS 1-2 VIEWS  COMPARISON:  No priors.  FINDINGS: Two views of the pelvis and right hip demonstrate postoperative changes of right hip hemiarthroplasty. The femoral component appears properly seated without periprosthetic fracture or other immediate complicating features. The prosthetic femoral head appears located within the acetabulum on both views. Visualized portions of the bony pelvis and left proximal femur are unremarkable. Moderate degenerative changes of osteoarthritis in the left hip joint.  IMPRESSION: 1. Postoperative changes of right hip hemiarthroplasty without acute complicating features, as above.   Electronically Signed   By: Trudie Reed M.D.   On: 04/25/2014 18:23   Dg Hip Unilat With Pelvis 2-3  Views Right  04/23/2014   CLINICAL DATA:  81 year old female who fell last night with severe right hip pain. Initial encounter.  EXAM: RIGHT HIP (WITH PELVIS) 2-3 VIEWS  COMPARISON:  None.  FINDINGS: Right femoral neck fracture risk varus impaction. Right femoral head remains normally located.  No superimposed acute pelvis fracture identified. Osteopenia. Grossly intact proximal left femur. Partially visible advanced lumbar spine degeneration.  IMPRESSION: Acute right femoral neck fracture with varus impaction.   Electronically Signed   By: Odessa FlemingH  Hall M.D.   On: 04/23/2014 10:33    Microbiology: Recent Results (from the past 240 hour(s))  Surgical pcr screen     Status: Abnormal   Collection Time: 04/24/14  9:13 PM  Result Value Ref Range Status   MRSA, PCR NEGATIVE NEGATIVE Final   Staphylococcus aureus POSITIVE (A) NEGATIVE Final    Comment:        The Xpert SA Assay (FDA approved for NASAL specimens in patients over 79 years of age), is one component of a comprehensive surveillance program.  Test performance has been validated by [facility] for patients  greater than or equal to 79 year old. It is not intended to diagnose infection nor to guide or monitor treatment. RESULT CALLED TO, READ BACK BY AND VERIFIED WITH: Jacqulyn CaneK. HUDSON RN [phone] GREEN R      Labs: Basic Metabolic Panel:  Recent Labs Lab 04/23/14 1002 04/24/14 0740 04/26/14 0650 04/29/14 0444  NA 139 137 136 136  K 4.2 3.8 3.4* 4.0  CL 105 105 101 101  CO2 27 29 32 31  GLUCOSE 183* 112* 107* 105*  BUN 15 19 10 13   CREATININE 0.92 1.15* 0.97 0.90  CALCIUM 9.1 8.5 8.3* 8.3*   Liver Function Tests:  Recent Labs Lab 04/23/14 1002  AST 40*  ALT 21  ALKPHOS 65  BILITOT 1.4*  PROT 8.2  ALBUMIN 4.2   No results for input(s): LIPASE, AMYLASE in the last 168 hours. No results for input(s): AMMONIA in the last 168 hours. CBC:  Recent Labs Lab 04/24/14 0740 04/26/14 0650 04/27/14 0555 04/28/14 0617 04/29/14 0444  WBC 11.8* 9.0 9.7 9.6 8.2  HGB 13.3 12.8 13.0 13.0 12.2  HCT 40.4 39.3 39.5 39.6 38.0  MCV 92.9 92.5 92.9 94.3 94.1  PLT 138* 133* 147* 159 183   Cardiac Enzymes: No results for input(s): CKTOTAL, CKMB, CKMBINDEX, TROPONINI in the last 168 hours. BNP: BNP (last 3 results) No results for input(s): BNP in the last 8760 hours.  ProBNP (last 3 results) No results for input(s): PROBNP in the last 8760 hours.  CBG: No results for input(s): GLUCAP in the last 168 hours.     SignedMarlin Canary:  VANN, JESSICA  [facility] 04/29/2014, 10:56 AM

## 2014-04-30 ENCOUNTER — Non-Acute Institutional Stay (SKILLED_NURSING_FACILITY): Payer: Medicare Other | Admitting: Adult Health

## 2014-04-30 DIAGNOSIS — E039 Hypothyroidism, unspecified: Secondary | ICD-10-CM

## 2014-04-30 DIAGNOSIS — I10 Essential (primary) hypertension: Secondary | ICD-10-CM

## 2014-04-30 DIAGNOSIS — Z7901 Long term (current) use of anticoagulants: Secondary | ICD-10-CM

## 2014-04-30 DIAGNOSIS — K5900 Constipation, unspecified: Secondary | ICD-10-CM

## 2014-04-30 DIAGNOSIS — E785 Hyperlipidemia, unspecified: Secondary | ICD-10-CM

## 2014-04-30 DIAGNOSIS — I4891 Unspecified atrial fibrillation: Secondary | ICD-10-CM

## 2014-04-30 DIAGNOSIS — M84459A Pathological fracture, hip, unspecified, initial encounter for fracture: Secondary | ICD-10-CM

## 2014-04-30 DIAGNOSIS — M80059A Age-related osteoporosis with current pathological fracture, unspecified femur, initial encounter for fracture: Secondary | ICD-10-CM

## 2014-05-01 ENCOUNTER — Non-Acute Institutional Stay (SKILLED_NURSING_FACILITY): Payer: Medicare Other | Admitting: Internal Medicine

## 2014-05-01 ENCOUNTER — Other Ambulatory Visit: Payer: Self-pay | Admitting: *Deleted

## 2014-05-01 DIAGNOSIS — K4090 Unilateral inguinal hernia, without obstruction or gangrene, not specified as recurrent: Secondary | ICD-10-CM

## 2014-05-01 DIAGNOSIS — E039 Hypothyroidism, unspecified: Secondary | ICD-10-CM

## 2014-05-01 DIAGNOSIS — I4891 Unspecified atrial fibrillation: Secondary | ICD-10-CM | POA: Diagnosis not present

## 2014-05-01 DIAGNOSIS — M80059A Age-related osteoporosis with current pathological fracture, unspecified femur, initial encounter for fracture: Secondary | ICD-10-CM

## 2014-05-01 DIAGNOSIS — E785 Hyperlipidemia, unspecified: Secondary | ICD-10-CM

## 2014-05-01 DIAGNOSIS — K5900 Constipation, unspecified: Secondary | ICD-10-CM

## 2014-05-01 DIAGNOSIS — M84459A Pathological fracture, hip, unspecified, initial encounter for fracture: Secondary | ICD-10-CM

## 2014-05-01 DIAGNOSIS — I10 Essential (primary) hypertension: Secondary | ICD-10-CM | POA: Diagnosis not present

## 2014-05-01 MED ORDER — HYDROCODONE-ACETAMINOPHEN 5-325 MG PO TABS: 1.0000 | ORAL_TABLET | Freq: Four times a day (QID) | ORAL | Status: DC | PRN

## 2014-05-02 ENCOUNTER — Encounter: Payer: Self-pay | Admitting: Adult Health

## 2014-05-02 NOTE — Progress Notes (Signed)
Patient ID: Amanda [name], female   DOB: 03/15/1922, 81 y.o.   MRN: [id number]   04/30/14  Facility:  Nursing Home Location:  [facility] Room Number: [id number] LEVEL OF CARE:  SNF (31)   Chief Complaint  Patient presents with  . Hospitalization Follow-up    Right femoral neck fracture S/P Right hip arthroplasty, atrial fibrillation, hypothyroidism, hypertension, hyperlipidemia and constipation    HISTORY OF PRESENT ILLNESS:  This is an 81 year old female who was being admitted to [facility] 04/29/14 [facility]. She has past medical history of atrial fibrillation, hypertension, hyperlipidemia and atrial fibrillation. She fell at home and sustained a right femoral neck fracture and now status post right hip arthroplasty. She is on chronic Coumadin therapy and has received vitamin K IV before surgery.  She has been admitted for a short-term rehabilitation.  PAST MEDICAL HISTORY:  Past Medical History  Diagnosis Date  . Atrial fibrillation   . HLD (hyperlipidemia)   . GERD (gastroesophageal reflux disease)   . Back pain   . Osteoarthritis     knee  . Hypothyroidism   . Osteoporosis   . PUD (peptic ulcer disease)     hx of upper GI bleeding   . Arthritis     CURRENT MEDICATIONS: Reviewed per MAR/see medication list  Allergies  Allergen Reactions  . Aspirin Diarrhea    REACTION: black tarry stools  . Nsaids Other (See Comments)    Patient has ulcer   . Codeine Other (See Comments)    REACTION: GI upset  . Sulfonamide Derivatives Rash     REVIEW OF SYSTEMS:  GENERAL: no change in appetite, no fatigue, no weight changes, no fever, chills or weakness RESPIRATORY: no cough, SOB, DOE, wheezing, hemoptysis CARDIAC: no chest pain, edema or palpitations GI: no abdominal pain, diarrhea, constipation, heart burn, nausea or vomiting  PHYSICAL EXAMINATION  GENERAL: no acute distress, normal body habitus EYES: conjunctivae normal,  sclerae normal, normal eye lids NECK: supple, trachea midline, no neck masses, no thyroid tenderness, no thyromegaly LYMPHATICS: no LAN in the neck, no supraclavicular LAN RESPIRATORY: breathing is even & unlabored, BS CTAB CARDIAC: Irregularly irregular, no murmur,no extra heart sounds, no edema GI: abdomen soft, normal BS, no masses, no tenderness, no hepatomegaly, no splenomegaly EXTREMITIES:  Able to move all 4 extremities PSYCHIATRIC: the patient is alert & oriented to person, affect & behavior appropriate  LABS/RADIOLOGY: Labs reviewed: Basic Metabolic Panel:  Recent Labs  40/98/1102/17/16 0740 04/26/14 0650 04/29/14 0444  NA 137 136 136  K 3.8 3.4* 4.0  CL 105 101 101  CO2 29 32 31  GLUCOSE 112* 107* 105*  BUN 19 10 13   CREATININE 1.15* 0.97 0.90  CALCIUM 8.5 8.3* 8.3*   Liver Function Tests:  Recent Labs  04/23/14 1002  AST 40*  ALT 21  ALKPHOS 65  BILITOT 1.4*  PROT 8.2  ALBUMIN 4.2   CBC:  Recent Labs  04/27/14 0555 04/28/14 0617 04/29/14 0444  WBC 9.7 9.6 8.2  HGB 13.0 13.0 12.2  HCT 39.5 39.6 38.0  MCV 92.9 94.3 94.1  PLT 147* 159 183   Dg Chest 1 View  04/23/2014   CLINICAL DATA:  81 year old female who fell last night with hip fracture. Preoperative study. Initial encounter.  EXAM: CHEST  1 VIEW  COMPARISON:  08/02/2009.  FINDINGS: Portable AP semi upright view at 1018 hours. Stable cardiomegaly and mediastinal contours. Visualized tracheal air column is within normal limits.  Stable lung volumes. Allowing for portable technique, the lungs are clear. No acute osseous abnormality identified.  IMPRESSION: Stable cardiomegaly. No acute cardiopulmonary abnormality.   Electronically Signed   By: Odessa Fleming M.D.   On: 04/23/2014 10:34   Pelvis Portable  04/25/2014   CLINICAL DATA:  81 year old female with history of right hip arthroplasty.  EXAM: PORTABLE PELVIS 1-2 VIEWS  COMPARISON:  No priors.  FINDINGS: Two views of the pelvis and right hip demonstrate  postoperative changes of right hip hemiarthroplasty. The femoral component appears properly seated without periprosthetic fracture or other immediate complicating features. The prosthetic femoral head appears located within the acetabulum on both views. Visualized portions of the bony pelvis and left proximal femur are unremarkable. Moderate degenerative changes of osteoarthritis in the left hip joint.  IMPRESSION: 1. Postoperative changes of right hip hemiarthroplasty without acute complicating features, as above.   Electronically Signed   By: Trudie Reed M.D.   On: 04/25/2014 18:23   Dg Hip Unilat With Pelvis 2-3 Views Right  04/23/2014   CLINICAL DATA:  81 year old female who fell last night with severe right hip pain. Initial encounter.  EXAM: RIGHT HIP (WITH PELVIS) 2-3 VIEWS  COMPARISON:  None.  FINDINGS: Right femoral neck fracture risk varus impaction. Right femoral head remains normally located.  No superimposed acute pelvis fracture identified. Osteopenia. Grossly intact proximal left femur. Partially visible advanced lumbar spine degeneration.  IMPRESSION: Acute right femoral neck fracture with varus impaction.   Electronically Signed   By: Odessa Fleming M.D.   On: 04/23/2014 10:33    ASSESSMENT/PLAN:  Right femoral neck fracture S/P right hip arthroplasty - for rehabilitation; continue Norco 5/325 mg 1-2 tabs by mouth every 6 hours when necessary Atrial fibrillation - rate controlled; continue Lopressor 25 mg by mouth twice a day and Coumadin Long-term use of anticoagulant - INR 1.9 - subtherapeutic; Coumadin 4 mg by mouth daily and recheck INR on 05/03/14 Hypothyroidism - continue Synthroid 75 g 1 tab by mouth daily Hypertension - well controlled; continue Lopressor 25 mg by mouth twice a day Hyperlipidemia - continue Lipitor 10 mg 1 tab by mouth daily Constipation - continue Colace 100 mg by mouth twice a day   Goals of care:  Short-term rehabilitation   Labs/test ordered:   none   Spent 50 minutes in patient care.    [facility], NP [employer] [phone]

## 2014-05-03 ENCOUNTER — Encounter: Payer: Self-pay | Admitting: Adult Health

## 2014-05-03 ENCOUNTER — Non-Acute Institutional Stay (SKILLED_NURSING_FACILITY): Payer: Medicare Other | Admitting: Adult Health

## 2014-05-03 DIAGNOSIS — K5900 Constipation, unspecified: Secondary | ICD-10-CM

## 2014-05-03 DIAGNOSIS — I10 Essential (primary) hypertension: Secondary | ICD-10-CM

## 2014-05-03 DIAGNOSIS — Z7901 Long term (current) use of anticoagulants: Secondary | ICD-10-CM

## 2014-05-03 DIAGNOSIS — E785 Hyperlipidemia, unspecified: Secondary | ICD-10-CM

## 2014-05-03 DIAGNOSIS — E039 Hypothyroidism, unspecified: Secondary | ICD-10-CM

## 2014-05-03 DIAGNOSIS — I4891 Unspecified atrial fibrillation: Secondary | ICD-10-CM

## 2014-05-03 NOTE — Progress Notes (Signed)
Patient ID: Amanda [name], female   DOB: 07/04/1922, 81 y.o.   MRN: [id number]   05/03/14  Facility:  Nursing Home Location:  [facility] Room Number: [id number] LEVEL OF CARE:  SNF (31)   Chief Complaint  Patient presents with  . Discharge Note    Right femoral neck fracture S/P Right hip arthroplasty, atrial fibrillation, hypothyroidism, hypertension, hyperlipidemia and constipation    HISTORY OF PRESENT ILLNESS:  This is an 81 year old female who is for discharge home with home health for endurance and OT for ADLs.  [facility]. She has past medical history of atrial fibrillation, hypertension, hyperlipidemia and atrial fibrillation. She fell at home and sustained a right femoral neck fracture and now status post right hip arthroplasty. She is on chronic Coumadin therapy and has received vitamin K IV before surgery.  Patient was admitted to this facility for short-term rehabilitation after the patient's recent hospitalization.  Patient has completed SNF rehabilitation and therapy has cleared the patient for discharge.  PAST MEDICAL HISTORY:  Past Medical History  Diagnosis Date  . Atrial fibrillation   . HLD (hyperlipidemia)   . GERD (gastroesophageal reflux disease)   . Back pain   . Osteoarthritis     knee  . Hypothyroidism   . Osteoporosis   . PUD (peptic ulcer disease)     hx of upper GI bleeding   . Arthritis     CURRENT MEDICATIONS: Reviewed per MAR/see medication list  Allergies  Allergen Reactions  . Aspirin Diarrhea    REACTION: black tarry stools  . Nsaids Other (See Comments)    Patient has ulcer   . Codeine Other (See Comments)    REACTION: GI upset  . Sulfonamide Derivatives Rash     REVIEW OF SYSTEMS:  GENERAL: no change in appetite, no fatigue, no weight changes, no fever, chills or weakness RESPIRATORY: no cough, SOB, DOE, wheezing, hemoptysis CARDIAC: no chest  pain, edema or palpitations GI: no abdominal pain, diarrhea, constipation, heart burn, nausea or vomiting  PHYSICAL EXAMINATION  GENERAL: no acute distress, normal body habitus NECK: supple, trachea midline, no neck masses, no thyroid tenderness, no thyromegaly LYMPHATICS: no LAN in the neck, no supraclavicular LAN RESPIRATORY: breathing is even & unlabored, BS CTAB CARDIAC: Irregularly irregular, no murmur,no extra heart sounds, no edema GI: abdomen soft, normal BS, no masses, no tenderness, no hepatomegaly, no splenomegaly EXTREMITIES:  Able to move all 4 extremities PSYCHIATRIC: the patient is alert & oriented to person, affect & behavior appropriate  LABS/RADIOLOGY: Labs reviewed: Basic Metabolic Panel:  Recent Labs  78/29/5602/17/16 0740 04/26/14 0650 04/29/14 0444  NA 137 136 136  K 3.8 3.4* 4.0  CL 105 101 101  CO2 29 32 31  GLUCOSE 112* 107* 105*  BUN 19 10 13   CREATININE 1.15* 0.97 0.90  CALCIUM 8.5 8.3* 8.3*   Liver Function Tests:  Recent Labs  04/23/14 1002  AST 40*  ALT 21  ALKPHOS 65  BILITOT 1.4*  PROT 8.2  ALBUMIN 4.2   CBC:  Recent Labs  04/27/14 0555 04/28/14 0617 04/29/14 0444  WBC 9.7 9.6 8.2  HGB 13.0 13.0 12.2  HCT 39.5 39.6 38.0  MCV 92.9 94.3 94.1  PLT 147* 159 183   Dg Chest 1 View  04/23/2014   CLINICAL DATA:  81 year old female who fell last night with hip fracture. Preoperative study. Initial encounter.  EXAM: CHEST  1 VIEW  COMPARISON:  08/02/2009.  FINDINGS: Portable AP semi upright view at 1018 hours. Stable cardiomegaly and mediastinal contours. Visualized tracheal air column is within normal limits. Stable lung volumes. Allowing for portable technique, the lungs are clear. No acute osseous abnormality identified.  IMPRESSION: Stable cardiomegaly. No acute cardiopulmonary abnormality.   Electronically Signed   By: Odessa Fleming M.D.   On: 04/23/2014 10:34   Pelvis Portable  04/25/2014   CLINICAL DATA:  81 year old female with history of  right hip arthroplasty.  EXAM: PORTABLE PELVIS 1-2 VIEWS  COMPARISON:  No priors.  FINDINGS: Two views of the pelvis and right hip demonstrate postoperative changes of right hip hemiarthroplasty. The femoral component appears properly seated without periprosthetic fracture or other immediate complicating features. The prosthetic femoral head appears located within the acetabulum on both views. Visualized portions of the bony pelvis and left proximal femur are unremarkable. Moderate degenerative changes of osteoarthritis in the left hip joint.  IMPRESSION: 1. Postoperative changes of right hip hemiarthroplasty without acute complicating features, as above.   Electronically Signed   By: Trudie Reed M.D.   On: 04/25/2014 18:23   Dg Hip Unilat With Pelvis 2-3 Views Right  04/23/2014   CLINICAL DATA:  81 year old female who fell last night with severe right hip pain. Initial encounter.  EXAM: RIGHT HIP (WITH PELVIS) 2-3 VIEWS  COMPARISON:  None.  FINDINGS: Right femoral neck fracture risk varus impaction. Right femoral head remains normally located.  No superimposed acute pelvis fracture identified. Osteopenia. Grossly intact proximal left femur. Partially visible advanced lumbar spine degeneration.  IMPRESSION: Acute right femoral neck fracture with varus impaction.   Electronically Signed   By: Odessa Fleming M.D.   On: 04/23/2014 10:33    ASSESSMENT/PLAN:  Right femoral neck fracture S/P right hip arthroplasty - for home health PT and OT; continue Norco 5/325 mg 1-2 tabs by mouth every 6 hours when necessary Atrial fibrillation - rate controlled; continue Lopressor 25 mg by mouth twice a day and Coumadin Long-term use of anticoagulant - INR 2.6 - therapeutic; Coumadin 4 mg by mouth daily and recheck INR on 05/07/14 Hypothyroidism - continue Synthroid 75 g 1 tab by mouth daily Hypertension - well controlled; continue Lopressor 25 mg by mouth twice a day Hyperlipidemia - continue Lipitor 10 mg 1 tab by mouth  daily Constipation - continue Colace 100 mg by mouth twice a day   I have filled out patient's discharge paperwork and written prescriptions.  Patient will receive home health PT and OT.  Total discharge time: Greater than 30 minutes  Discharge time involved coordination of the discharge process with social worker, nursing staff and therapy department. Medical justification for home health services verified.     [facility], NP [employer] [phone]

## 2014-05-12 NOTE — Progress Notes (Signed)
Patient ID: Amanda [name], female   DOB: [birth year], 81 y.o.   MRN: [id number]     [facility]   PCP: Herb Grays, MD  Code Status: full code  Allergies  Allergen Reactions  . Aspirin Diarrhea    REACTION: black tarry stools  . Nsaids Other (See Comments)    Patient has ulcer   . Codeine Other (See Comments)    REACTION: GI upset  . Sulfonamide Derivatives Rash    Chief Complaint  Patient presents with  . New Admit To SNF     HPI:  81 year old patient is here for short term rehabilitation post hospital admission after a fall with right femoral neck fracture. she underwent right hip arthroplasty She has past medical history of atrial fibrillation, hypertension, hyperlipidemia and atrial fibrillation on coumadin. She is seen in her room today. Her pain is under control with current regimen. Denies any muscle spasm. Had bowel movement this am. Her husband, son and daughter are present this visit.  Review of Systems:  Constitutional: Negative for fever, chills, malaise/fatigue and diaphoresis.  HENT: Negative for headache, congestion, nasal discharge Eyes: Negative for eye pain, blurred vision, double vision and discharge.  Respiratory: Negative for cough, shortness of breath and wheezing.   Cardiovascular: Negative for chest pain, palpitations, leg swelling.  Gastrointestinal: Negative for heartburn, nausea, vomiting, abdominal pain Genitourinary: Negative for dysuria Musculoskeletal: Negative for back pain, falls Skin: Negative for itching, rash.  Neurological: Negative for dizziness, tingling, focal weakness Psychiatric/Behavioral: Negative for depression   Past Medical History  Diagnosis Date  . Atrial fibrillation   . HLD (hyperlipidemia)   . GERD (gastroesophageal reflux disease)   . Back pain   . Osteoarthritis     knee  . Hypothyroidism   . Osteoporosis   . PUD (peptic ulcer disease)     hx of upper GI bleeding   .  Arthritis    Past Surgical History  Procedure Laterality Date  . 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 Hip arthroplasty Right 04/25/2014    Procedure: ARTHROPLASTY BIPOLAR HIP;  Surgeon: Sheral Apley, MD;  Location: [facility] OR;  Service: Orthopedics;  Laterality: Right;   Social History:   reports that she has never smoked. She has never used smokeless tobacco. She reports that she does not drink alcohol or use illicit drugs.  Family History  Problem Relation Age of Onset  . Kidney cancer      family hx  . Coronary artery disease      family hx  . Arthritis      family hx    Medications: Patient's Medications  New Prescriptions   No medications on file  Previous Medications   ACETAMINOPHEN (TYLENOL) 500 MG TABLET    Take 1,000 mg by mouth every 4 (four) hours as needed for mild pain or headache.   CALCIUM CARBONATE-VITAMIN D (CALCIUM 600+D) 600-400 MG-UNIT PER TABLET    Take 1 tablet by mouth 2 (two) times daily.   CHOLECALCIFEROL (VITAMIN D3) 5000 UNITS TABS    Take 1 tablet by mouth every Monday, Wednesday, and Friday.   CRANBERRY 500 MG CAPS    Take 1 capsule by mouth daily.   DOCUSATE SODIUM (COLACE) 100 MG CAPSULE    Take 1 capsule (100 mg total) by mouth 2 (two) times daily.   FEEDING SUPPLEMENT, ENSURE COMPLETE, (ENSURE COMPLETE) LIQD    Take 237 mLs by mouth 2 (two) times daily  between meals.   FOLIC ACID (FOLVITE) 400 MCG TABLET    Take 400 mcg by mouth daily.     HYDROCODONE-ACETAMINOPHEN (NORCO) 5-325 MG PER TABLET    Take 1-2 tablets by mouth every 6 (six) hours as needed for moderate pain.   LEVOTHYROXINE (SYNTHROID, LEVOTHROID) 75 MCG TABLET    Take 75 mcg by mouth daily.     METOPROLOL TARTRATE (LOPRESSOR) 25 MG TABLET    TAKE ONE TABLET BY MOUTH TWICE DAILY-MUST BE SEEN   MISC NATURAL PRODUCTS (TURMERIC CURCUMIN) CAPS    Take 1 capsule by mouth daily.     MULTIPLE VITAMINS-MINERALS (CENTRUM PO)    Take 1 tablet by mouth daily.     OMEGA-3 FATTY ACIDS (FISH  OIL) 1200 MG CAPS    Take 1 capsule by mouth 2 (two) times daily.    ONDANSETRON (ZOFRAN) 4 MG TABLET    Take 1 tablet (4 mg total) by mouth every 8 (eight) hours as needed for nausea or vomiting.   POLYETHYLENE GLYCOL (MIRALAX / GLYCOLAX) PACKET    Take 17 g by mouth daily as needed for mild constipation.   PRAVASTATIN (PRAVACHOL) 40 MG TABLET    Take 40 mg by mouth daily.    SIMETHICONE (MYLICON) 80 MG CHEWABLE TABLET    Chew 1 tablet (80 mg total) by mouth every 6 (six) hours as needed for flatulence.   SODIUM PHOSPHATE (FLEET) 7-19 GM/118ML ENEM    Place 133 mLs (1 enema total) rectally daily as needed for severe constipation.   VITAMIN B-12 (CYANOCOBALAMIN) 500 MCG TABLET    Take 500 mcg by mouth daily.   WARFARIN (COUMADIN) 5 MG TABLET    Take 2.5-5 mg by mouth daily. Takes 2.5 mg on Monday, Wednesday, and Friday Takes 5 mg on Tuesday, Thursday, Saturday, and Sunday  Modified Medications   No medications on file  Discontinued Medications   No medications on file     Physical Exam: Filed Vitals:   05/01/14 2127  BP: 126/77  Pulse: 68  Temp: 97.8 F (36.6 C)  Resp: 16  SpO2: 98%    General- elderly female, well built, in no acute distress Head- normocephalic, atraumatic Throat- moist mucus membrane Neck- no cervical lymphadenopathy Cardiovascular- normal s1,s2, no murmurs, palpable dorsalis pedis, trace right leg edema Respiratory- bilateral clear to auscultation, no wheeze, no rhonchi, no crackles, no use of accessory muscles Abdomen- bowel sounds present, soft, non tender, right inguinal hernia Musculoskeletal- able to move all 4 extremities, right leg ROM limited with pain Neurological- no focal deficit Skin- warm and dry Psychiatry- alert and oriented to person, place and time, normal mood and affect    Labs reviewed: Basic Metabolic Panel:  Recent Labs  16/10/96 0740 04/26/14 0650 04/29/14 0444  NA 137 136 136  K 3.8 3.4* 4.0  CL 105 101 101  CO2 29 32 31    GLUCOSE 112* 107* 105*  BUN CREATININE 1.15* 0.97 0.90  CALCIUM 8.5 8.3* 8.3*   Liver Function Tests:  Recent Labs  04/23/14 1002  AST 40*  ALT 21  ALKPHOS 65  BILITOT 1.4*  PROT 8.2  ALBUMIN 4.2   No results for input(s): LIPASE, AMYLASE in the last 8760 hours. No results for input(s): AMMONIA in the last 8760 hours. CBC:  Recent Labs  04/27/14 0555 04/28/14 0617 04/29/14 0444  WBC 9.7 9.6 8.2  HGB 13.0 13.0 12.2  HCT 39.5 39.6 38.0  MCV 92.9 94.3 94.1  PLT 147* 159 183    Assessment/Plan  Right femoral neck fracture  S/P right hip arthroplasty. Will have her work with physical therapy and occupational therapy team to help with gait training and muscle strengthening exercises.fall precautions. Skin care. Encourage to be out of bed.  Continue Norco 5/325 mg 1-2 tabs by mouth every 6 hours prn for pain. Has f/u with orthopedics.  Hypertension continue Lopressor 25 mg bid  Atrial fibrillation rate controlled. continue Lopressor 25 mg bid and Coumadin 4 mg fdaily. inr 04/30/14 1.9, recheck on 05/03/14  Constipation continue Colace 100 mg bid  Hypothyroidism continue Synthroid 75 mcg daily  Hyperlipidemia continue Lipitor 10 mg daily  Right inguinal hernia Reducible, no signs of pain, strangulation or incarceration, to f/u as outpatient as patient is interested in surgical repair if possible  Goals of care: short term rehabilitation   Family/ staff Communication: reviewed care plan with patient and nursing supervisor    Oneal GroutMAHIMA Laneisha Mino, MD  [facility] [phone] (Monday-Friday 8 am - 5 pm) [phone] (afterhours)

## 2014-05-13 DIAGNOSIS — M170 Bilateral primary osteoarthritis of knee: Secondary | ICD-10-CM | POA: Diagnosis not present

## 2014-05-13 DIAGNOSIS — I10 Essential (primary) hypertension: Secondary | ICD-10-CM | POA: Diagnosis not present

## 2014-05-13 DIAGNOSIS — M810 Age-related osteoporosis without current pathological fracture: Secondary | ICD-10-CM | POA: Diagnosis not present

## 2014-05-13 DIAGNOSIS — Z471 Aftercare following joint replacement surgery: Secondary | ICD-10-CM | POA: Diagnosis not present

## 2014-05-13 DIAGNOSIS — I4891 Unspecified atrial fibrillation: Secondary | ICD-10-CM | POA: Diagnosis not present

## 2014-05-13 DIAGNOSIS — Z96641 Presence of right artificial hip joint: Secondary | ICD-10-CM | POA: Diagnosis not present

## 2014-06-18 ENCOUNTER — Other Ambulatory Visit: Payer: Self-pay | Admitting: General Surgery

## 2014-07-31 ENCOUNTER — Ambulatory Visit: Payer: Medicare Other | Admitting: Cardiology

## 2014-08-23 ENCOUNTER — Ambulatory Visit: Payer: Medicare Other | Admitting: Nurse Practitioner

## 2014-12-27 ENCOUNTER — Emergency Department (HOSPITAL_COMMUNITY)
Admission: EM | Admit: 2014-12-27 | Discharge: 2014-12-27 | Disposition: A | Discharge status: Home / Self Care | Payer: Medicare Other | Attending: Emergency Medicine | Admitting: Emergency Medicine

## 2014-12-27 ENCOUNTER — Encounter (HOSPITAL_COMMUNITY): Payer: Self-pay | Admitting: Emergency Medicine

## 2014-12-27 DIAGNOSIS — R2689 Other abnormalities of gait and mobility: Secondary | ICD-10-CM | POA: Diagnosis not present

## 2014-12-27 DIAGNOSIS — K219 Gastro-esophageal reflux disease without esophagitis: Secondary | ICD-10-CM | POA: Diagnosis not present

## 2014-12-27 DIAGNOSIS — R42 Dizziness and giddiness: Secondary | ICD-10-CM | POA: Insufficient documentation

## 2014-12-27 DIAGNOSIS — R030 Elevated blood-pressure reading, without diagnosis of hypertension: Secondary | ICD-10-CM | POA: Insufficient documentation

## 2014-12-27 DIAGNOSIS — F419 Anxiety disorder, unspecified: Secondary | ICD-10-CM | POA: Diagnosis present

## 2014-12-27 DIAGNOSIS — G2571 Drug induced akathisia: Secondary | ICD-10-CM | POA: Diagnosis not present

## 2014-12-27 DIAGNOSIS — Z8679 Personal history of other diseases of the circulatory system: Secondary | ICD-10-CM | POA: Diagnosis not present

## 2014-12-27 DIAGNOSIS — E039 Hypothyroidism, unspecified: Secondary | ICD-10-CM | POA: Diagnosis not present

## 2014-12-27 DIAGNOSIS — M810 Age-related osteoporosis without current pathological fracture: Secondary | ICD-10-CM | POA: Diagnosis not present

## 2014-12-27 DIAGNOSIS — E785 Hyperlipidemia, unspecified: Secondary | ICD-10-CM | POA: Diagnosis not present

## 2014-12-27 DIAGNOSIS — Z79899 Other long term (current) drug therapy: Secondary | ICD-10-CM | POA: Diagnosis not present

## 2014-12-27 DIAGNOSIS — Z7901 Long term (current) use of anticoagulants: Secondary | ICD-10-CM | POA: Diagnosis not present

## 2014-12-27 DIAGNOSIS — T43595A Adverse effect of other antipsychotics and neuroleptics, initial encounter: Secondary | ICD-10-CM | POA: Insufficient documentation

## 2014-12-27 LAB — CBC WITH DIFFERENTIAL/PLATELET
Basophils Absolute: 0 10*3/uL (ref 0.0–0.1)
Basophils Relative: 0 %
EOS ABS: 0.1 10*3/uL (ref 0.0–0.7)
EOS PCT: 1 %
HCT: 50.2 % — ABNORMAL HIGH (ref 36.0–46.0)
Hemoglobin: 16.5 g/dL — ABNORMAL HIGH (ref 12.0–15.0)
Lymphocytes Relative: 34 %
Lymphs Abs: 2.8 10*3/uL (ref 0.7–4.0)
MCH: 30.8 pg (ref 26.0–34.0)
MCHC: 32.9 g/dL (ref 30.0–36.0)
MCV: 93.7 fL (ref 78.0–100.0)
Monocytes Absolute: 0.7 10*3/uL (ref 0.1–1.0)
Monocytes Relative: 8 %
NEUTROS PCT: 57 %
Neutro Abs: 4.6 10*3/uL (ref 1.7–7.7)
PLATELETS: 170 10*3/uL (ref 150–400)
RBC: 5.36 MIL/uL — AB (ref 3.87–5.11)
RDW: 13.8 % (ref 11.5–15.5)
WBC: 8.3 10*3/uL (ref 4.0–10.5)

## 2014-12-27 LAB — BASIC METABOLIC PANEL
Anion gap: 10 (ref 5–15)
BUN: 16 mg/dL (ref 6–20)
CO2: 27 mmol/L (ref 22–32)
Calcium: 10.4 mg/dL — ABNORMAL HIGH (ref 8.9–10.3)
Chloride: 102 mmol/L (ref 101–111)
Creatinine, Ser: 1.03 mg/dL — ABNORMAL HIGH (ref 0.44–1.00)
GFR calc Af Amer: 54 mL/min — ABNORMAL LOW (ref >60)
GFR, EST NON AFRICAN AMERICAN: 46 mL/min — AB (ref >60)
Glucose, Bld: 115 mg/dL — ABNORMAL HIGH (ref 65–99)
POTASSIUM: 3.8 mmol/L (ref 3.5–5.1)
SODIUM: 139 mmol/L (ref 135–145)

## 2014-12-27 LAB — PROTIME-INR
INR: 3.11 — AB (ref 0.00–1.49)
PROTHROMBIN TIME: 31.4 s — AB (ref 11.6–15.2)

## 2014-12-27 MED ORDER — DIPHENHYDRAMINE HCL 25 MG PO CAPS
25.0000 mg | ORAL_CAPSULE | Freq: Once | ORAL | Status: AC
  Administered 2014-12-27: 25 mg via ORAL
  Filled 2014-12-27: qty 1

## 2014-12-27 NOTE — ED Notes (Signed)
Pt BIB family.  Family states that they put her on haldol for confusion about a month ago.  States that pt has been anxious and nervous for about a week. Family states that she shakes and will pace and go from chair to chair, restless.

## 2014-12-27 NOTE — ED Provider Notes (Addendum)
CSN: [id number]     Arrival date & time 12/27/14  1835 History   First MD Initiated Contact with Patient 12/27/14 1959     Chief Complaint  Patient presents with  . Anxiety     (Consider location/radiation/quality/duration/timing/severity/associated sxs/prior Treatment) HPI   Patient feels nervous   and anxious for the past 1 week since her Haldol from twice a day to 3 times a day 1 week ago.. She's also had increased trouble in walking for the past 1 week other's symptoms include insomnia for the past 1 week. She also reports occasional lightheadedness though none today.  No other associated symptoms. Patient's Coumadin dosage was decreased 9 days ago she had INR of 4.79 days ago. Presently asymptomatic except feels "jittery. Past Medical History  Diagnosis Date  . Atrial fibrillation (HCC)   . HLD (hyperlipidemia)   . GERD (gastroesophageal reflux disease)   . Back pain   . Osteoarthritis     knee  . Hypothyroidism   . Osteoporosis   . PUD (peptic ulcer disease)     hx of upper GI bleeding   . Arthritis    Past Surgical History  Procedure Laterality Date  . 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 Hip arthroplasty Right 04/25/2014    Procedure: ARTHROPLASTY BIPOLAR HIP;  Surgeon: Sheral Apley, MD;  Location: [facility] OR;  Service: Orthopedics;  Laterality: Right;   Family History  Problem Relation Age of Onset  . Kidney cancer      family hx  . Coronary artery disease      family hx  . Arthritis      family hx   Social History  Substance Use Topics  . Smoking status: Never Smoker   . Smokeless tobacco: Never Used     Comment: tobacco use - no  . Alcohol Use: No   OB History    No data available     Review of Systems  Constitutional: Negative.   HENT: Negative.   Respiratory: Negative.   Cardiovascular: Negative.   Gastrointestinal: Negative.   Musculoskeletal: Positive for gait problem.       Walks with walker  Skin: Negative.   Neurological: Negative.    Hematological: Bruises/bleeds easily.  Psychiatric/Behavioral: The patient is nervous/anxious.        Insomnia service  All other systems reviewed and are negative.     Allergies  Aspirin; Nsaids; Codeine; and Sulfonamide derivatives  Home Medications   Prior to Admission medications   Medication Sig Start Date End Date Taking? Authorizing Provider  acetaminophen (TYLENOL) 500 MG tablet Take 1,000 mg by mouth every 4 (four) hours as needed for mild pain or headache.    Historical Provider, MD  Calcium Carbonate-Vitamin D (CALCIUM 600+D) 600-400 MG-UNIT per tablet Take 1 tablet by mouth 2 (two) times daily.    Historical Provider, MD  Cholecalciferol (VITAMIN D3) 5000 UNITS TABS Take 1 tablet by mouth every Monday, Wednesday, and Friday.    Historical Provider, MD  Cranberry 500 MG CAPS Take 1 capsule by mouth daily.    Historical Provider, MD  docusate sodium (COLACE) 100 MG capsule Take 1 capsule (100 mg total) by mouth 2 (two) times daily. 04/25/14   Brittney Tresa Endo, PA-C  feeding supplement, ENSURE COMPLETE, (ENSURE COMPLETE) LIQD Take 237 mLs by mouth 2 (two) times daily between meals. 04/29/14   Joseph Art, DO  folic acid (FOLVITE) 400 MCG tablet Take 400 mcg by mouth daily.  Historical Provider, MD  HYDROcodone-acetaminophen (NORCO) 5-325 MG per tablet Take 1-2 tablets by mouth every 6 (six) hours as needed for moderate pain. 05/01/14   Kimber RelicArthur G Green, MD  levothyroxine (SYNTHROID, LEVOTHROID) 75 MCG tablet Take 75 mcg by mouth daily.      Historical Provider, MD  metoprolol tartrate (LOPRESSOR) 25 MG tablet TAKE ONE TABLET BY MOUTH TWICE DAILY-MUST BE SEEN Patient taking differently: TAKE ONE TABLET BY MOUTH TWICE DAILY 06/14/13   Lars MassonKatarina H Nelson, MD  Misc Natural Products (TURMERIC CURCUMIN) CAPS Take 1 capsule by mouth daily.      Historical Provider, MD  Multiple Vitamins-Minerals (CENTRUM PO) Take 1 tablet by mouth daily.      Historical Provider, MD  Omega-3 Fatty Acids  (FISH OIL) 1200 MG CAPS Take 1 capsule by mouth 2 (two) times daily.     Historical Provider, MD  ondansetron (ZOFRAN) 4 MG tablet Take 1 tablet (4 mg total) by mouth every 8 (eight) hours as needed for nausea or vomiting. 04/25/14   Janalee DaneBrittney Kelly, PA-C  polyethylene glycol (MIRALAX / GLYCOLAX) packet Take 17 g by mouth daily as needed for mild constipation. 04/29/14   Joseph ArtJessica U Vann, DO  pravastatin (PRAVACHOL) 40 MG tablet Take 40 mg by mouth daily.     Historical Provider, MD  simethicone (MYLICON) 80 MG chewable tablet Chew 1 tablet (80 mg total) by mouth every 6 (six) hours as needed for flatulence. 04/29/14   Joseph ArtJessica U Vann, DO  sodium phosphate (FLEET) 7-19 GM/118ML ENEM Place 133 mLs (1 enema total) rectally daily as needed for severe constipation. 04/29/14   Joseph ArtJessica U Vann, DO  vitamin B-12 (CYANOCOBALAMIN) 500 MCG tablet Take 500 mcg by mouth daily.    Historical Provider, MD  warfarin (COUMADIN) 5 MG tablet Take 2.5-5 mg by mouth daily. Takes 2.5 mg on Monday, Wednesday, and Friday Takes 5 mg on Tuesday, Thursday, Saturday, and Sunday    Historical Provider, MD   There were no vitals taken for this visit. Physical Exam  Constitutional: She appears well-developed and well-nourished.  HENT:  Head: Normocephalic and atraumatic.  Eyes: Conjunctivae are normal. Pupils are equal, round, and reactive to light.  Neck: Neck supple. No tracheal deviation present. No thyromegaly present.  Cardiovascular: Normal rate.   No murmur heard. Irregularly irregular  Pulmonary/Chest: Effort normal and breath sounds normal.  Abdominal: Soft. Bowel sounds are normal. She exhibits no distension. There is no tenderness.  Musculoskeletal: Normal range of motion. She exhibits no edema or tenderness.  Neurological: She is alert. No cranial nerve deficit. Coordination normal.  Walks with minimal assistance  Skin: Skin is warm and dry. No rash noted.  Reddish ecchymosis dorsum of right hand otherwise without  rash  Psychiatric: She has a normal mood and affect.  Nursing note and vitals reviewed.   ED Course  Procedures (including critical care time) Labs Review Labs Reviewed - No data to display  Imaging Review No results found. I have personally reviewed and evaluated these images and lab results as part of my medical decision-making.   EKG Interpretation None     10 :10 PM patient feels improved after treatment with Benadryl. Results for orders placed or performed during the hospital encounter of 12/27/14  CBC with Differential/Platelet  Result Value Ref Range   WBC 8.3 4.0 - 10.5 K/uL   RBC 5.36 (H) 3.87 - 5.11 MIL/uL   Hemoglobin 16.5 (H) 12.0 - 15.0 g/dL   HCT 16.150.2 (H) 09.636.0 - 04.546.0 %  MCV 93.7 78.0 - 100.0 fL   MCH 30.8 26.0 - 34.0 pg   MCHC 32.9 30.0 - 36.0 g/dL   RDW 40.9 81.1 - 91.4 %   Platelets 170 150 - 400 K/uL   Neutrophils Relative % 57 %   Neutro Abs 4.6 1.7 - 7.7 K/uL   Lymphocytes Relative 34 %   Lymphs Abs 2.8 0.7 - 4.0 K/uL   Monocytes Relative 8 %   Monocytes Absolute 0.7 0.1 - 1.0 K/uL   Eosinophils Relative 1 %   Eosinophils Absolute 0.1 0.0 - 0.7 K/uL   Basophils Relative 0 %   Basophils Absolute 0.0 0.0 - 0.1 K/uL  Basic metabolic panel  Result Value Ref Range   Sodium 139 135 - 145 mmol/L   Potassium 3.8 3.5 - 5.1 mmol/L   Chloride 102 101 - 111 mmol/L   CO2 27 22 - 32 mmol/L   Glucose, Bld 115 (H) 65 - 99 mg/dL   BUN 16 6 - 20 mg/dL   Creatinine, Ser 7.82 (H) 0.44 - 1.00 mg/dL   Calcium 95.6 (H) 8.9 - 10.3 mg/dL   GFR calc non Af Amer 46 (L) >60 mL/min   GFR calc Af Amer 54 (L) >60 mL/min   Anion gap 10 5 - 15  Protime-INR  Result Value Ref Range   Prothrombin Time 31.4 (H) 11.6 - 15.2 seconds   INR 3.11 (H) 0.00 - 1.49   No results found.  MDM  Patient felt to have akathisia as a side effect increased dose of Haldol. Keep appt to get inr recked in 2 weeks Plan Benadryl 25 mg 3 times daily. Blood pressure recheck 1 week Diagnosis #1  adverse reaction to medication #2 elevated blood pressure  Final diagnoses:  None        Doug Sou, MD 12/27/14 2216  Doug Sou, MD 12/27/14 2222

## 2014-12-27 NOTE — ED Notes (Addendum)
Pt reports feeling "awfully jittery"; denies feeling this way at any time in the past; daughter reports pt was put on Haldol a month ago and has since had the dose increased; pt denies a psychiatric history

## 2014-12-27 NOTE — Discharge Instructions (Signed)
Take Benadryl 25 milligram every 8 hours .Your INR (Coumadin level) was 3.11 today. Keep your scheduled appointment to have your INR rechecked in 2 weeks. Call Dr. Andrey CampanileWilson on Monday, 12/30/2014 to schedule a follow-up appointment. Your medications may need to be adjusted further. Your blood pressure should be rechecked in a week. Today's was elevated at 186/91

## 2014-12-27 NOTE — ED Notes (Signed)
Bed: [id number] Expected date:  Expected time:  Means of arrival:  Comments: 7390 F anxiety

## 2015-02-21 ENCOUNTER — Ambulatory Visit (INDEPENDENT_AMBULATORY_CARE_PROVIDER_SITE_OTHER): Payer: Medicare Other | Admitting: Cardiology

## 2015-02-21 ENCOUNTER — Encounter: Payer: Self-pay | Admitting: Cardiology

## 2015-02-21 VITALS — BP 122/70 | HR 66 | Ht 66.0 in | Wt 121.0 lb

## 2015-02-21 DIAGNOSIS — I481 Persistent atrial fibrillation: Secondary | ICD-10-CM

## 2015-02-21 DIAGNOSIS — I5032 Chronic diastolic (congestive) heart failure: Secondary | ICD-10-CM | POA: Diagnosis not present

## 2015-02-21 DIAGNOSIS — Z01810 Encounter for preprocedural cardiovascular examination: Secondary | ICD-10-CM | POA: Diagnosis not present

## 2015-02-21 DIAGNOSIS — I10 Essential (primary) hypertension: Secondary | ICD-10-CM

## 2015-02-21 DIAGNOSIS — I4819 Other persistent atrial fibrillation: Secondary | ICD-10-CM | POA: Insufficient documentation

## 2015-02-21 NOTE — Progress Notes (Signed)
Patient ID: Amanda [name], female   DOB: November 24, 1922, 81 y.o.   MRN: [id number]     Patient Name: Amanda [name] Date of Encounter: 02/21/2015  Primary Care Provider:  Pamelia Hoit, MD Primary Cardiologist:  Lars Masson Previously Dr Daleen Squibb  Problem List   Past Medical History  Diagnosis Date  . Atrial fibrillation (HCC)   . HLD (hyperlipidemia)   . GERD (gastroesophageal reflux disease)   . Back pain   . Osteoarthritis     knee  . Hypothyroidism   . Osteoporosis   . PUD (peptic ulcer disease)     hx of upper GI bleeding   . Arthritis    Past Surgical History  Procedure Laterality Date  . 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 Hip arthroplasty Right 04/25/2014    Procedure: ARTHROPLASTY BIPOLAR HIP;  Surgeon: Sheral Apley, MD;  Location: [facility] OR;  Service: Orthopedics;  Laterality: Right;    Allergies  Allergies  Allergen Reactions  . Aspirin Diarrhea    REACTION: black tarry stools  . Nsaids Other (See Comments)    Patient has ulcer   . Codeine Other (See Comments)    REACTION: GI upset  . Sulfonamide Derivatives Rash    Chief complain: Abdominal pain, preop clearance  HPI  Amanda [name] returns today for evaluation and management of her chronic atrial fibrillation and anticoagulation. She is totally asymptomatic and denies any symptoms of palpitations, presyncope, syncope or falls. She has no chest pain or dyspnea on exertion. She denies any bleeding. Her INRs have been consistently therapeutic. She remains very active working in her backyard. Earlier this year she fell and broke her hip, underwent repair without complications. She has been suffering from abdominal pain sec to hernia, she is scheduled for a repair. She needs a preop clearance.  She denies CP, DOE, LE edema, PND or orthopnea.  Home Medications  Prior to Admission medications   Medication Sig Start Date End Date Taking? Authorizing Provider  Calcium Carbonate-Vitamin D (CALCIUM  600+D) 600-400 MG-UNIT per tablet Take 1 tablet by mouth 2 (two) times daily.   Yes Historical Provider, MD  folic acid (FOLVITE) 400 MCG tablet Take 400 mcg by mouth daily.     Yes Historical Provider, MD  levothyroxine (SYNTHROID, LEVOTHROID) 75 MCG tablet Take 75 mcg by mouth daily.     Yes Historical Provider, MD  LORazepam (ATIVAN) 0.5 MG tablet Take 0.25-0.5 mg by mouth at bedtime as needed.     Yes Historical Provider, MD  metoprolol tartrate (LOPRESSOR) 25 MG tablet TAKE ONE TABLET BY MOUTH TWICE DAILY.  MUST SCHEDULE APPOINTMENT FOR FURTHER REFILLS. 05/30/13  Yes Lars Masson, MD  Misc Natural Products (TURMERIC CURCUMIN) CAPS Take 1 capsule by mouth daily.     Yes Historical Provider, MD  Multiple Vitamins-Minerals (CENTRUM PO) Take 1 tablet by mouth daily.     Yes Historical Provider, MD  Omega-3 Fatty Acids (FISH OIL) 1200 MG CAPS Take by mouth 2 (two) times daily.   Yes Historical Provider, MD  pravastatin (PRAVACHOL) 40 MG tablet Take 40 mg by mouth 2 (two) times daily.    Yes Historical Provider, MD  vitamin D, CHOLECALCIFEROL, 400 UNITS tablet Take 400 Units by mouth daily.    Yes Historical Provider, MD  warfarin (COUMADIN) 2.5 MG tablet Take as directed by anticoagulation clinic 02/29/12  Yes Gaylord Shih, MD    Family History  Family History  Problem Relation Age of Onset  .  Kidney cancer      family hx  . Coronary artery disease      family hx  . Arthritis      family hx    Social History  Social History   Social History  . Marital Status: Married    Spouse Name: N/A  . Number of Children: N/A  . Years of Education: N/A   Occupational History  . Not on file.   Social History Main Topics  . Smoking status: Never Smoker   . Smokeless tobacco: Never Used     Comment: tobacco use - no  . Alcohol Use: No  . Drug Use: No  . Sexual Activity: No   Other Topics Concern  . Not on file   Social History Narrative     Review of Systems, as per HPI,  otherwise negative General:  No chills, fever, night sweats or weight changes.  Cardiovascular:  No chest pain, dyspnea on exertion, edema, orthopnea, palpitations, paroxysmal nocturnal dyspnea. Dermatological: No rash, lesions/masses Respiratory: No cough, dyspnea Urologic: No hematuria, dysuria Abdominal:   No nausea, vomiting, diarrhea, bright red blood per rectum, melena, or hematemesis Neurologic:  No visual changes, wkns, changes in mental status. All other systems reviewed and are otherwise negative except as noted above.  Physical Exam  Blood pressure 122/70, pulse 66, height  (1.676 m), weight 121 lb (54.885 kg).  General: Pleasant, NAD Psych: Normal affect. Neuro: Alert and oriented X 3. Moves all extremities spontaneously. HEENT: Normal  Neck: Supple without bruits or JVD. Lungs:  Resp regular and unlabored, CTA. Heart: Irregularly irregular no s3, s4, or murmurs. Abdomen: Soft, non-tender, non-distended, BS + x 4.  Extremities: No clubbing, cyanosis or edema. DP/PT/Radials 2+ and equal bilaterally.  Labs:  No results for input(s): CKTOTAL, CKMB, TROPONINI in the last 72 hours. Lab Results  Component Value Date   WBC 8.3 12/27/2014   HGB 16.5* 12/27/2014   HCT 50.2* 12/27/2014   MCV 93.7 12/27/2014   PLT 170 12/27/2014   Accessory Clinical Findings  Echocardiogram - 07/03/2009  - Left ventricle: There is anterolateral hypokinesis. The cavity size was normal. Wall thickness was normal. The estimated ejection fraction was 40%. - Mitral valve: Mild regurgitation. - Left atrium: The atrium was moderately dilated. - Right atrium: The atrium was mildly dilated. - Tricuspid valve: Mild-moderate regurgitation. - Pulmonary arteries: PA peak pressure: 42mm Hg (S).  ECG - atrial fibrillation with controlled ventricular rate 66 beats per minute, abnormal EKG  TTE: 06/2013 Left ventricle: The cavity size was normal. Wall thickness was normal. Systolic function  was normal. The estimated ejection fraction was in the range of 60% to 65%. Indeterminant diastolic function (atrial fibrillation). Wall motion was normal; there were no regional wall motion abnormalities. - Aortic valve: There was no stenosis. Trivial regurgitation. - Mitral valve: Mildly calcified annulus. Normal thickness leaflets . Mild regurgitation. - Left atrium: The atrium was moderately dilated. - Right ventricle: The cavity size was normal. Systolic function was normal. - Right atrium: The atrium was mildly dilated. - Tricuspid valve: Peak RV-RA gradient: 30mm Hg (S). - Pulmonary arteries: PA peak pressure: 33mm Hg (S). - Pericardium, extracardiac: Small circumferential pericardial effusion. Impressions:  - The patient appeared to be in atrial fibrillation. Normal LV size and systolic function, EF 60-65%. Normal RV size and systolic function. Mild MR. Biatrial enlargement.    Assessment & Plan  Very pleasant functional 81 year old female  1. Chronic atrial fibrillation - rate controlled with metoprolol  to continue compliant to warfarin.  2.  Chronic systolic CHF, LV EF 40% in 2011. Patient is euvolemic and well compensated. The last echocardiogram from 06/2013 showed normal LVEF.  3. Hypertension - controlled.  4. Hyperlipidemia- followed by primary care physician   5. Preoperative CV evaluation - the patient has no signs of angina, CHF or critical valve disease. She can certainly achieve at least 4 METS. There is currently no contraindication from cardiac standpoint for this patient to undergo hernia repair.  Followup in 1 year.  Lars MassonNELSON, Naod Sweetland H, MD, [facility] 02/21/2015, 4:22 PM

## 2015-02-21 NOTE — Patient Instructions (Signed)
Medication Instructions: NO  CHANGES  Labwork: NO LAB WORK TODAY   Your physician wants you to follow-up in: ONE YEAR  You will receive a reminder letter in the mail two months in advance.  If you don't receive a letter, please call our office to schedule the follow-up appointment.   If you need a refill on your cardiac medications before your next appointment, please call your pharmacy.

## 2015-02-26 ENCOUNTER — Encounter: Payer: Self-pay | Admitting: Cardiology

## 2015-03-04 NOTE — Addendum Note (Signed)
Addended by: Reesa ChewJONES, Sharonlee Nine G on: 03/04/2015 05:04 PM   Modules accepted: Orders

## 2015-03-20 ENCOUNTER — Other Ambulatory Visit: Payer: Self-pay | Admitting: General Surgery

## 2015-05-06 ENCOUNTER — Encounter (HOSPITAL_COMMUNITY): Payer: Self-pay

## 2015-05-06 ENCOUNTER — Encounter (HOSPITAL_COMMUNITY)
Admission: RE | Admit: 2015-05-06 | Discharge: 2015-05-06 | Disposition: A | Discharge status: Home / Self Care | Payer: Medicare Other | Source: Ambulatory Visit | Attending: General Surgery | Admitting: General Surgery

## 2015-05-06 DIAGNOSIS — K4090 Unilateral inguinal hernia, without obstruction or gangrene, not specified as recurrent: Secondary | ICD-10-CM | POA: Diagnosis not present

## 2015-05-06 DIAGNOSIS — E039 Hypothyroidism, unspecified: Secondary | ICD-10-CM | POA: Diagnosis not present

## 2015-05-06 DIAGNOSIS — Z01812 Encounter for preprocedural laboratory examination: Secondary | ICD-10-CM | POA: Insufficient documentation

## 2015-05-06 DIAGNOSIS — Z79899 Other long term (current) drug therapy: Secondary | ICD-10-CM | POA: Diagnosis not present

## 2015-05-06 DIAGNOSIS — F419 Anxiety disorder, unspecified: Secondary | ICD-10-CM | POA: Insufficient documentation

## 2015-05-06 DIAGNOSIS — Z7901 Long term (current) use of anticoagulants: Secondary | ICD-10-CM | POA: Diagnosis not present

## 2015-05-06 DIAGNOSIS — I4891 Unspecified atrial fibrillation: Secondary | ICD-10-CM | POA: Insufficient documentation

## 2015-05-06 DIAGNOSIS — Z01818 Encounter for other preprocedural examination: Secondary | ICD-10-CM | POA: Insufficient documentation

## 2015-05-06 DIAGNOSIS — E785 Hyperlipidemia, unspecified: Secondary | ICD-10-CM | POA: Insufficient documentation

## 2015-05-06 DIAGNOSIS — K219 Gastro-esophageal reflux disease without esophagitis: Secondary | ICD-10-CM | POA: Insufficient documentation

## 2015-05-06 DIAGNOSIS — R413 Other amnesia: Secondary | ICD-10-CM | POA: Insufficient documentation

## 2015-05-06 DIAGNOSIS — Z96641 Presence of right artificial hip joint: Secondary | ICD-10-CM | POA: Diagnosis not present

## 2015-05-06 HISTORY — DX: Presence of spectacles and contact lenses: Z97.3

## 2015-05-06 HISTORY — DX: Essential (primary) hypertension: I10

## 2015-05-06 HISTORY — DX: Other amnesia: R41.3

## 2015-05-06 HISTORY — DX: Insomnia, unspecified: G47.00

## 2015-05-06 HISTORY — DX: Anxiety disorder, unspecified: F41.9

## 2015-05-06 HISTORY — DX: Cardiac arrhythmia, unspecified: I49.9

## 2015-05-06 LAB — CBC
HCT: 44.6 % (ref 36.0–46.0)
Hemoglobin: 14.3 g/dL (ref 12.0–15.0)
MCH: 31 pg (ref 26.0–34.0)
MCHC: 32.1 g/dL (ref 30.0–36.0)
MCV: 96.5 fL (ref 78.0–100.0)
PLATELETS: 153 10*3/uL (ref 150–400)
RBC: 4.62 MIL/uL (ref 3.87–5.11)
RDW: 13.1 % (ref 11.5–15.5)
WBC: 7.4 10*3/uL (ref 4.0–10.5)

## 2015-05-06 LAB — BASIC METABOLIC PANEL
Anion gap: 6 (ref 5–15)
BUN: 14 mg/dL (ref 6–20)
CALCIUM: 9.6 mg/dL (ref 8.9–10.3)
CHLORIDE: 107 mmol/L (ref 101–111)
CO2: 27 mmol/L (ref 22–32)
CREATININE: 1.02 mg/dL — AB (ref 0.44–1.00)
GFR calc non Af Amer: 47 mL/min — ABNORMAL LOW (ref >60)
GFR, EST AFRICAN AMERICAN: 54 mL/min — AB (ref >60)
Glucose, Bld: 108 mg/dL — ABNORMAL HIGH (ref 65–99)
Potassium: 4.3 mmol/L (ref 3.5–5.1)
SODIUM: 140 mmol/L (ref 135–145)

## 2015-05-06 MED ORDER — CHLORHEXIDINE GLUCONATE 4 % EX LIQD: 1.0000 "application " | Freq: Once | CUTANEOUS | Status: DC

## 2015-05-06 NOTE — Progress Notes (Signed)
PCP - Dr. Benedetto Goad Cardiologist - Dr. Andreas Ohm  EKG - 02/21/15 CXR - denies  Echo - 06/2013 Stress test/Cardiac Cath - denies  Patient denies chest pain and shortness of breath at PAT appointment.

## 2015-05-06 NOTE — Pre-Procedure Instructions (Signed)
    GEORGINE WILTSE  05/06/2015      [facility] - [place],  - [address] [place] [address] Phone: [phone] Fax: [phone]    Your procedure is scheduled on Wednesday, March 8th, 2017  Report to [facility] Admitting at 8:30 A.M.  Call this number if you have problems the morning of surgery:  [phone]   Remember:  Do not eat food or drink liquids after midnight.    Take these medicines the morning of surgery with A SIP OF WATER: Levothyroxine (Synthroid), Metoprolol Tartrate (Lopressor).  On Friday, March 3rd, stop taking: Warfarin (Coumadin), Aspirin, NSAIDs, Aleve, Naproxen, Ibuprofen, Advil, Motrin, BC's, Goody's, Fish Oil, all herbal medications, and all vitamins.    Do not wear jewelry, make-up or nail polish.  Do not wear lotions, powders, or perfumes.    Do not shave 48 hours prior to surgery.    Do not bring valuables to the hospital.  [facility] is not responsible for any belongings or valuables.  Contacts, dentures or bridgework may not be worn into surgery.  Leave your suitcase in the car.  After surgery it may be brought to your room.  For patients admitted to the hospital, discharge time will be determined by your treatment team.  Patients discharged the day of surgery will not be allowed to drive home.   Special instructions:  See attached.   Please read over the following fact sheets that you were given. Pain Booklet, Coughing and Deep Breathing and Surgical Site Infection Prevention

## 2015-05-07 NOTE — Progress Notes (Signed)
Anesthesia Chart Review: Patient is a 82 year old female scheduled for right IHR with mesh on 05/14/15 by Dr. Carolynne Edouard.  History includes non-smoker, afib, HLD, GERD, hypothyroidism, GI bleed, memory impairment, anxiety, osteoporosis, right THA 05/05/14. PCP is listed as Dr. Benedetto Goad. Cardiologist is Dr. Tobias [place], last visit 02/22/15 and was aware of plans for hernia repair. Patient is able to achieve 4 METS, so Dr. Delton See did not think there was any contraindication for surgery from a cardiac standpoint.   Meds listed include folic acid, Lopressor, pravastatin, Seroquel, warfarin (holding starting 05/09/15).  02/21/15 EKG: Afib, minimal voltage criteria for LVH, may be normal variant. Septal infarct (age undetermined). Dr. Delton See felt EKG was unchanged from prior. HR was 77 at PAT.  06/21/13 Echo: Impressions: - The patient appeared to be in atrial fibrillation. Normal LV size and systolic function, EF 60-65%. Normal RV size and systolic function. Mild MR. Biatrial enlargement.  Preoperative labs noted. She will need a PT/INR on the day of surgery.  If INR acceptable and otherwise no acute changes then I anticipate that she can proceed as planned.  Velna Ochs [facility] Short Stay Center/Anesthesiology Phone [phone] 05/07/2015 12:36 PM

## 2015-05-13 MED ORDER — CEFAZOLIN SODIUM-DEXTROSE 2-3 GM-% IV SOLR
2.0000 g | INTRAVENOUS | Status: AC
  Administered 2015-05-14: 2 g via INTRAVENOUS
  Filled 2015-05-13: qty 50

## 2015-05-14 ENCOUNTER — Encounter (HOSPITAL_COMMUNITY): Payer: Self-pay | Admitting: Certified Registered Nurse Anesthetist

## 2015-05-14 ENCOUNTER — Ambulatory Visit (HOSPITAL_COMMUNITY): Payer: Medicare Other | Admitting: Emergency Medicine

## 2015-05-14 ENCOUNTER — Ambulatory Visit (HOSPITAL_COMMUNITY): Payer: Medicare Other | Admitting: Anesthesiology

## 2015-05-14 ENCOUNTER — Ambulatory Visit (HOSPITAL_COMMUNITY)
Admission: RE | Admit: 2015-05-14 | Discharge: 2015-05-15 | Disposition: A | Discharge status: Home / Self Care | Payer: Medicare Other | Source: Ambulatory Visit | Attending: General Surgery | Admitting: General Surgery

## 2015-05-14 ENCOUNTER — Encounter (HOSPITAL_COMMUNITY)
Admission: RE | Disposition: A | Discharge status: Home / Self Care | Payer: Self-pay | Source: Ambulatory Visit | Attending: General Surgery

## 2015-05-14 DIAGNOSIS — M1990 Unspecified osteoarthritis, unspecified site: Secondary | ICD-10-CM | POA: Insufficient documentation

## 2015-05-14 DIAGNOSIS — K219 Gastro-esophageal reflux disease without esophagitis: Secondary | ICD-10-CM | POA: Diagnosis not present

## 2015-05-14 DIAGNOSIS — K279 Peptic ulcer, site unspecified, unspecified as acute or chronic, without hemorrhage or perforation: Secondary | ICD-10-CM | POA: Insufficient documentation

## 2015-05-14 DIAGNOSIS — K4090 Unilateral inguinal hernia, without obstruction or gangrene, not specified as recurrent: Secondary | ICD-10-CM

## 2015-05-14 DIAGNOSIS — Z885 Allergy status to narcotic agent status: Secondary | ICD-10-CM | POA: Diagnosis not present

## 2015-05-14 DIAGNOSIS — I10 Essential (primary) hypertension: Secondary | ICD-10-CM | POA: Diagnosis not present

## 2015-05-14 DIAGNOSIS — Z888 Allergy status to other drugs, medicaments and biological substances status: Secondary | ICD-10-CM | POA: Insufficient documentation

## 2015-05-14 DIAGNOSIS — E039 Hypothyroidism, unspecified: Secondary | ICD-10-CM | POA: Insufficient documentation

## 2015-05-14 DIAGNOSIS — Z882 Allergy status to sulfonamides status: Secondary | ICD-10-CM | POA: Diagnosis not present

## 2015-05-14 DIAGNOSIS — I4891 Unspecified atrial fibrillation: Secondary | ICD-10-CM | POA: Insufficient documentation

## 2015-05-14 DIAGNOSIS — Z886 Allergy status to analgesic agent status: Secondary | ICD-10-CM | POA: Diagnosis not present

## 2015-05-14 DIAGNOSIS — F419 Anxiety disorder, unspecified: Secondary | ICD-10-CM | POA: Diagnosis not present

## 2015-05-14 DIAGNOSIS — Z7901 Long term (current) use of anticoagulants: Secondary | ICD-10-CM | POA: Insufficient documentation

## 2015-05-14 HISTORY — PX: INGUINAL HERNIA REPAIR: SHX194

## 2015-05-14 HISTORY — PX: INSERTION OF MESH: SHX5868

## 2015-05-14 LAB — PROTIME-INR
INR: 1.2 (ref 0.00–1.49)
Prothrombin Time: 15.4 seconds — ABNORMAL HIGH (ref 11.6–15.2)

## 2015-05-14 SURGERY — REPAIR, HERNIA, INGUINAL, ADULT
Anesthesia: Regional | Site: Groin | Laterality: Right

## 2015-05-14 MED ORDER — SUGAMMADEX SODIUM 200 MG/2ML IV SOLN
INTRAVENOUS | Status: AC
  Filled 2015-05-14: qty 2

## 2015-05-14 MED ORDER — ONDANSETRON HCL 4 MG/2ML IJ SOLN
4.0000 mg | Freq: Four times a day (QID) | INTRAMUSCULAR | Status: DC | PRN
  Administered 2015-05-14: 4 mg via INTRAVENOUS
  Filled 2015-05-14: qty 2

## 2015-05-14 MED ORDER — WARFARIN SODIUM 5 MG PO TABS: 5.0000 mg | ORAL_TABLET | ORAL | Status: DC

## 2015-05-14 MED ORDER — FENTANYL CITRATE (PF) 250 MCG/5ML IJ SOLN
INTRAMUSCULAR | Status: AC
  Filled 2015-05-14: qty 5

## 2015-05-14 MED ORDER — HYDRALAZINE HCL 20 MG/ML IJ SOLN
INTRAMUSCULAR | Status: AC
  Filled 2015-05-14: qty 1

## 2015-05-14 MED ORDER — KCL IN DEXTROSE-NACL 20-5-0.9 MEQ/L-%-% IV SOLN
INTRAVENOUS | Status: DC
  Administered 2015-05-14: 15:00:00 via INTRAVENOUS
  Filled 2015-05-14: qty 1000

## 2015-05-14 MED ORDER — SUGAMMADEX SODIUM 200 MG/2ML IV SOLN
INTRAVENOUS | Status: DC | PRN
  Administered 2015-05-14: 120 mg via INTRAVENOUS

## 2015-05-14 MED ORDER — WARFARIN - PHYSICIAN DOSING INPATIENT: Freq: Every day | Status: DC

## 2015-05-14 MED ORDER — BUPIVACAINE-EPINEPHRINE 0.25% -1:200000 IJ SOLN
INTRAMUSCULAR | Status: DC | PRN
  Administered 2015-05-14: 20 mL

## 2015-05-14 MED ORDER — PANTOPRAZOLE SODIUM 40 MG IV SOLR
40.0000 mg | Freq: Every day | INTRAVENOUS | Status: DC
  Administered 2015-05-14: 40 mg via INTRAVENOUS
  Filled 2015-05-14: qty 40

## 2015-05-14 MED ORDER — SODIUM CHLORIDE 0.9 % IJ SOLN
INTRAMUSCULAR | Status: AC
  Filled 2015-05-14: qty 10

## 2015-05-14 MED ORDER — PRAVASTATIN SODIUM 40 MG PO TABS
40.0000 mg | ORAL_TABLET | Freq: Every day | ORAL | Status: DC
  Administered 2015-05-14: 40 mg via ORAL
  Filled 2015-05-14: qty 1

## 2015-05-14 MED ORDER — BUPIVACAINE-EPINEPHRINE (PF) 0.25% -1:200000 IJ SOLN
INTRAMUSCULAR | Status: AC
  Filled 2015-05-14: qty 30

## 2015-05-14 MED ORDER — METOPROLOL TARTRATE 25 MG PO TABS
25.0000 mg | ORAL_TABLET | Freq: Two times a day (BID) | ORAL | Status: DC
  Administered 2015-05-14: 25 mg via ORAL
  Filled 2015-05-14: qty 1

## 2015-05-14 MED ORDER — PROPOFOL 500 MG/50ML IV EMUL
INTRAVENOUS | Status: DC | PRN
  Administered 2015-05-14: 25 ug/kg/min via INTRAVENOUS

## 2015-05-14 MED ORDER — PROPOFOL 10 MG/ML IV BOLUS
INTRAVENOUS | Status: DC | PRN
  Administered 2015-05-14: 50 mg via INTRAVENOUS
  Administered 2015-05-14: 30 mg via INTRAVENOUS

## 2015-05-14 MED ORDER — 0.9 % SODIUM CHLORIDE (POUR BTL) OPTIME
TOPICAL | Status: DC | PRN
  Administered 2015-05-14: 1000 mL

## 2015-05-14 MED ORDER — QUETIAPINE FUMARATE 50 MG PO TABS
25.0000 mg | ORAL_TABLET | Freq: Two times a day (BID) | ORAL | Status: DC
  Administered 2015-05-14: 25 mg via ORAL
  Filled 2015-05-14: qty 1

## 2015-05-14 MED ORDER — PROPOFOL 10 MG/ML IV BOLUS
INTRAVENOUS | Status: AC
  Filled 2015-05-14: qty 20

## 2015-05-14 MED ORDER — FENTANYL CITRATE (PF) 100 MCG/2ML IJ SOLN
INTRAMUSCULAR | Status: DC | PRN
  Administered 2015-05-14: 50 ug via INTRAVENOUS

## 2015-05-14 MED ORDER — HEPARIN SODIUM (PORCINE) 5000 UNIT/ML IJ SOLN
5000.0000 [IU] | Freq: Three times a day (TID) | INTRAMUSCULAR | Status: DC
  Filled 2015-05-14: qty 1

## 2015-05-14 MED ORDER — FENTANYL CITRATE (PF) 100 MCG/2ML IJ SOLN
25.0000 ug | INTRAMUSCULAR | Status: DC | PRN
  Administered 2015-05-14: 25 ug via INTRAVENOUS

## 2015-05-14 MED ORDER — WARFARIN SODIUM 5 MG PO TABS
2.5000 mg | ORAL_TABLET | ORAL | Status: DC
  Administered 2015-05-14: 2.5 mg via ORAL
  Filled 2015-05-14: qty 0.5

## 2015-05-14 MED ORDER — WARFARIN SODIUM 5 MG PO TABS: 2.5000 mg | ORAL_TABLET | Freq: Every day | ORAL | Status: DC

## 2015-05-14 MED ORDER — LEVOTHYROXINE SODIUM 75 MCG PO TABS
75.0000 ug | ORAL_TABLET | Freq: Every day | ORAL | Status: DC
  Administered 2015-05-15: 75 ug via ORAL
  Filled 2015-05-14: qty 1

## 2015-05-14 MED ORDER — BUPIVACAINE-EPINEPHRINE (PF) 0.5% -1:200000 IJ SOLN
INTRAMUSCULAR | Status: DC | PRN
  Administered 2015-05-14: 25 mL

## 2015-05-14 MED ORDER — ROCURONIUM BROMIDE 100 MG/10ML IV SOLN
INTRAVENOUS | Status: DC | PRN
Start: 2015-05-14 — End: 2015-05-14
  Administered 2015-05-14: 15 mg via INTRAVENOUS
  Administered 2015-05-14: 35 mg via INTRAVENOUS

## 2015-05-14 MED ORDER — ACETAMINOPHEN 325 MG PO TABS
650.0000 mg | ORAL_TABLET | ORAL | Status: DC | PRN
  Administered 2015-05-14 (×2): 650 mg via ORAL
  Filled 2015-05-14 (×2): qty 2

## 2015-05-14 MED ORDER — CHLORHEXIDINE GLUCONATE 4 % EX LIQD: 1.0000 "application " | Freq: Once | CUTANEOUS | Status: DC

## 2015-05-14 MED ORDER — ONDANSETRON HCL 4 MG/2ML IJ SOLN
INTRAMUSCULAR | Status: AC
  Filled 2015-05-14: qty 2

## 2015-05-14 MED ORDER — MORPHINE SULFATE (PF) 2 MG/ML IV SOLN: 1.0000 mg | INTRAVENOUS | Status: DC | PRN

## 2015-05-14 MED ORDER — FENTANYL CITRATE (PF) 100 MCG/2ML IJ SOLN
INTRAMUSCULAR | Status: AC
  Administered 2015-05-14: 50 ug
  Filled 2015-05-14: qty 2

## 2015-05-14 MED ORDER — ONDANSETRON 4 MG PO TBDP: 4.0000 mg | ORAL_TABLET | Freq: Four times a day (QID) | ORAL | Status: DC | PRN

## 2015-05-14 MED ORDER — ROCURONIUM BROMIDE 50 MG/5ML IV SOLN
INTRAVENOUS | Status: AC
  Filled 2015-05-14: qty 1

## 2015-05-14 MED ORDER — LIDOCAINE HCL (CARDIAC) 20 MG/ML IV SOLN
INTRAVENOUS | Status: DC | PRN
  Administered 2015-05-14: 40 mg via INTRAVENOUS

## 2015-05-14 MED ORDER — HYDROCODONE-ACETAMINOPHEN 5-325 MG PO TABS: 1.0000 | ORAL_TABLET | ORAL | Status: DC | PRN

## 2015-05-14 MED ORDER — ONDANSETRON HCL 4 MG/2ML IJ SOLN
INTRAMUSCULAR | Status: DC | PRN
  Administered 2015-05-14: 4 mg via INTRAVENOUS

## 2015-05-14 MED ORDER — HYDRALAZINE HCL 20 MG/ML IJ SOLN
INTRAMUSCULAR | Status: DC | PRN
  Administered 2015-05-14: 2.5 mg via INTRAVENOUS

## 2015-05-14 MED ORDER — FENTANYL CITRATE (PF) 100 MCG/2ML IJ SOLN
INTRAMUSCULAR | Status: AC
  Filled 2015-05-14: qty 2

## 2015-05-14 MED ORDER — LACTATED RINGERS IV SOLN
INTRAVENOUS | Status: DC
  Administered 2015-05-14: 50 mL/h via INTRAVENOUS

## 2015-05-14 MED ORDER — HYDROCODONE-ACETAMINOPHEN 5-325 MG PO TABS: 1.0000 | ORAL_TABLET | Freq: Four times a day (QID) | ORAL | Status: DC | PRN

## 2015-05-14 MED ORDER — LACTATED RINGERS IV SOLN
INTRAVENOUS | Status: DC | PRN
  Administered 2015-05-14: 10:00:00 via INTRAVENOUS

## 2015-05-14 SURGICAL SUPPLY — 46 items
BLADE SURG 10 STRL SS (BLADE) ×3 IMPLANT
BLADE SURG 15 STRL LF DISP TIS (BLADE) ×1 IMPLANT
BLADE SURG 15 STRL SS (BLADE) ×2
BLADE SURG ROTATE 9660 (MISCELLANEOUS) IMPLANT
CHLORAPREP W/TINT 26ML (MISCELLANEOUS) ×3 IMPLANT
COVER SURGICAL LIGHT HANDLE (MISCELLANEOUS) ×3 IMPLANT
DECANTER SPIKE VIAL GLASS SM (MISCELLANEOUS) ×3 IMPLANT
DRAIN PENROSE 1/2X12 LTX STRL (WOUND CARE) IMPLANT
DRAPE LAPAROSCOPIC ABDOMINAL (DRAPES) ×3 IMPLANT
DRAPE UTILITY XL STRL (DRAPES) ×6 IMPLANT
ELECT CAUTERY BLADE 6.4 (BLADE) ×3 IMPLANT
ELECT REM PT RETURN 9FT ADLT (ELECTROSURGICAL) ×3
ELECTRODE REM PT RTRN 9FT ADLT (ELECTROSURGICAL) ×1 IMPLANT
GLOVE BIO SURGEON STRL SZ7 (GLOVE) ×3 IMPLANT
GLOVE BIO SURGEON STRL SZ7.5 (GLOVE) ×3 IMPLANT
GLOVE BIOGEL PI IND STRL 7.0 (GLOVE) ×2 IMPLANT
GLOVE BIOGEL PI INDICATOR 7.0 (GLOVE) ×4
GLOVE SURG SS PI 6.5 STRL IVOR (GLOVE) ×3 IMPLANT
GOWN STRL REUS W/ TWL LRG LVL3 (GOWN DISPOSABLE) ×3 IMPLANT
GOWN STRL REUS W/TWL LRG LVL3 (GOWN DISPOSABLE) ×6
KIT BASIN OR (CUSTOM PROCEDURE TRAY) ×3 IMPLANT
KIT ROOM TURNOVER OR (KITS) ×3 IMPLANT
LIQUID BAND (GAUZE/BANDAGES/DRESSINGS) ×3 IMPLANT
MESH ULTRAPRO 3X6 7.6X15CM (Mesh General) ×3 IMPLANT
NEEDLE HYPO 25GX1X1/2 BEV (NEEDLE) ×3 IMPLANT
NS IRRIG 1000ML POUR BTL (IV SOLUTION) ×3 IMPLANT
PACK SURGICAL SETUP 50X90 (CUSTOM PROCEDURE TRAY) ×3 IMPLANT
PAD ARMBOARD 7.5X6 YLW CONV (MISCELLANEOUS) ×6 IMPLANT
PENCIL BUTTON HOLSTER BLD 10FT (ELECTRODE) ×3 IMPLANT
SPONGE LAP 18X18 X RAY DECT (DISPOSABLE) ×3 IMPLANT
SUT MNCRL AB 4-0 PS2 18 (SUTURE) ×3 IMPLANT
SUT PROLENE 2 0 SH DA (SUTURE) ×6 IMPLANT
SUT SILK 2 0 SH (SUTURE) ×3 IMPLANT
SUT SILK 3 0 (SUTURE) ×2
SUT SILK 3-0 18XBRD TIE 12 (SUTURE) ×1 IMPLANT
SUT VIC AB 0 CT1 27 (SUTURE) ×2
SUT VIC AB 0 CT1 27XBRD ANBCTR (SUTURE) ×1 IMPLANT
SUT VIC AB 2-0 SH 27 (SUTURE) ×2
SUT VIC AB 2-0 SH 27X BRD (SUTURE) ×1 IMPLANT
SUT VIC AB 3-0 SH 27 (SUTURE) ×2
SUT VIC AB 3-0 SH 27XBRD (SUTURE) ×1 IMPLANT
SYR BULB 3OZ (MISCELLANEOUS) ×3 IMPLANT
SYR CONTROL 10ML LL (SYRINGE) ×3 IMPLANT
TOWEL OR 17X24 6PK STRL BLUE (TOWEL DISPOSABLE) ×3 IMPLANT
TOWEL OR 17X26 10 PK STRL BLUE (TOWEL DISPOSABLE) ×3 IMPLANT
WATER STERILE IRR 1000ML POUR (IV SOLUTION) IMPLANT

## 2015-05-14 NOTE — Anesthesia Postprocedure Evaluation (Signed)
Anesthesia Post Note  Patient: Amanda [name]  Procedure(s) Performed: Procedure(s) (LRB): RIGHT INGUINAL HERNIA REPAIR WITH MESH (Right) INSERTION OF MESH (Right)  Patient location during evaluation: PACU Anesthesia Type: General and Regional Level of consciousness: awake and alert Pain management: pain level controlled Vital Signs Assessment: post-procedure vital signs reviewed and stable Respiratory status: spontaneous breathing, nonlabored ventilation, respiratory function stable and patient connected to nasal cannula oxygen Cardiovascular status: blood pressure returned to baseline and stable Postop Assessment: no signs of nausea or vomiting Anesthetic complications: no    Last Vitals:  Filed Vitals:   05/14/15 1358 05/14/15 1405  BP:  148/80  Pulse: 67 76  Temp:  36.6 C  Resp:  23    Last Pain:  Filed Vitals:   05/14/15 1406  PainSc: Asleep                 Natallia Stellmach,W. EDMOND

## 2015-05-14 NOTE — Anesthesia Procedure Notes (Addendum)
Anesthesia Regional Block:  TAP block  Pre-Anesthetic Checklist: ,, timeout performed, Correct Patient, Correct Site, Correct Laterality, Correct Procedure, Correct Position, site marked, Risks and benefits discussed, pre-op evaluation, post-op pain management  Laterality: Right  Prep: Maximum Sterile Barrier Precautions used and chloraprep       Needles:  Injection technique: Single-shot  Needle Type: Echogenic Stimulator Needle     Needle Length: 10cm 10 cm Needle Gauge: 21 and 21 G    Additional Needles:  Procedures: ultrasound guided (picture in chart) TAP block Narrative:  Start time: 05/14/2015 9:55 AM End time: 05/14/2015 10:05 AM Injection made incrementally with aspirations every 5 mL. Anesthesiologist: Gaynelle AduFITZGERALD, WILLIAM  Additional Notes: 2% Lidocaine skin wheel.   Procedure Name: Intubation Date/Time: 05/14/2015 10:30 AM Performed by: Rise PatienceBELL, Puneet Masoner T Pre-anesthesia Checklist: Patient identified, Emergency Drugs available, Suction available and Patient being monitored Patient Re-evaluated:Patient Re-evaluated prior to inductionOxygen Delivery Method: Circle system utilized Preoxygenation: Pre-oxygenation with 100% oxygen Intubation Type: IV induction Ventilation: Mask ventilation without difficulty Laryngoscope Size: Miller and 2 Grade View: Grade I Tube type: Oral Tube size: 7.0 mm Number of attempts: 1 Airway Equipment and Method: Stylet Placement Confirmation: ETT inserted through vocal cords under direct vision,  positive ETCO2 and breath sounds checked- equal and bilateral Secured at: 21 cm Tube secured with: Tape Dental Injury: Teeth and Oropharynx as per pre-operative assessment

## 2015-05-14 NOTE — H&P (Signed)
Amanda [name]  Location: [facility] Patient #: [id number] DOB: 1922-06-07 Married / Language: English / Race: White Female   History of Present Illness The patient is a 82 year old female who presents for a follow-up for Inguinal hernia. The patient is a 82 year old white female who we saw her earlier in 2016 with a symptomatic right inguinal hernia. Since her last visit she has gotten cardiac clearance and is felt to be low risk for surgery. She continues to have discomfort associated with the hernia. She denies any vomiting. She is ready to schedule her surgery.   Medication History Centrum (Oral) Active. Calcium Carbonate (  Tablet, Oral) Active. Fish Oil (  Capsule, Oral) Active. Vitamin D3 (5000UNIT Tablet, Oral) Active. Vitamin B12 ( Tablet, Oral) Active. Levothyroxine Sodium ( Tablet, Oral) Active. QUEtiapine Fumarate (  Tablet, Oral two times daily) Active. Metoprolol Tartrate (  Tablet, Oral) Active. Warfarin Sodium (2.5MG  Tablet, Oral tues-sat) Active. Pravastatin Sodium (  Tablet, Oral) Active. Medications Reconciled    Review of Systems General Not Present- Appetite Loss, Chills, Fatigue, Fever, Night Sweats, Weight Gain and Weight Loss. Skin Not Present- Change in Wart/Mole, Dryness, Hives, Jaundice, New Lesions, Non-Healing Wounds, Rash and Ulcer. HEENT Present- Wears glasses/contact lenses. Not Present- Earache, Hearing Loss, Hoarseness, Nose Bleed, Oral Ulcers, Ringing in the Ears, Seasonal Allergies, Sinus Pain, Sore Throat, Visual Disturbances and Yellow Eyes. Respiratory Not Present- Bloody sputum, Chronic Cough, Difficulty Breathing, Snoring and Wheezing. Breast Not Present- Breast Mass, Breast Pain, Nipple Discharge and Skin Changes. Cardiovascular Not Present- Chest Pain, Difficulty Breathing Lying Down, Leg Cramps, Palpitations, Rapid Heart Rate, Shortness of Breath and Swelling of  Extremities. Gastrointestinal Not Present- Abdominal Pain, Bloating, Bloody Stool, Change in Bowel Habits, Chronic diarrhea, Constipation, Difficulty Swallowing, Excessive gas, Gets full quickly at meals, Hemorrhoids, Indigestion, Nausea, Rectal Pain and Vomiting. Female Genitourinary Not Present- Frequency, Nocturia, Painful Urination, Pelvic Pain and Urgency. Musculoskeletal Not Present- Back Pain, Joint Pain, Joint Stiffness, Muscle Pain, Muscle Weakness and Swelling of Extremities. Neurological Not Present- Decreased Memory, Fainting, Headaches, Numbness, Seizures, Tingling, Tremor, Trouble walking and Weakness. Psychiatric Not Present- Anxiety, Bipolar, Change in Sleep Pattern, Depression, Fearful and Frequent crying. Endocrine Not Present- Cold Intolerance, Excessive Hunger, Hair Changes, Heat Intolerance, Hot flashes and New Diabetes. Hematology Not Present- Easy Bruising, Excessive bleeding, Gland problems, HIV and Persistent Infections.  Vitals  Weight: 115 lb Height: 63in Body Surface Area: 1.53 m Body Mass Index: 20.37 kg/m  Temp.: 98.11F(Temporal)  Pulse: 97 (Regular)  BP: 124/68 (Sitting, Left Arm, Standard)       Physical Exam General Mental Status-Alert. General Appearance-Consistent with stated age. Hydration-Well hydrated. Voice-Normal.  Head and Neck Head-normocephalic, atraumatic with no lesions or palpable masses. Trachea-midline. Thyroid Gland Characteristics - normal size and consistency.  Eye Eyeball - Bilateral-Extraocular movements intact. Sclera/Conjunctiva - Bilateral-No scleral icterus.  Chest and Lung Exam Chest and lung exam reveals -quiet, even and easy respiratory effort with no use of accessory muscles and on auscultation, normal breath sounds, no adventitious sounds and normal vocal resonance. Inspection Chest Wall - Normal. Back - normal.  Cardiovascular Cardiovascular examination reveals -normal heart  sounds, regular rate and rhythm with no murmurs and normal pedal pulses bilaterally.  Abdomen Inspection Inspection of the abdomen reveals - No Hernias. Skin - Scar - no surgical scars. Palpation/Percussion Palpation and Percussion of the abdomen reveal - Soft, Non Tender, No Rebound tenderness, No Rigidity (guarding) and No hepatosplenomegaly. Auscultation Auscultation of the abdomen reveals - Bowel sounds normal.  Female Genitourinary Note:  There is a moderate-sized reducible right inguinal hernia. There is no bulge or impulse was straining in the left groin.   Neurologic Neurologic evaluation reveals -alert and oriented x 3 with no impairment of recent or remote memory. Mental Status-Normal.  Musculoskeletal Normal Exam - Left-Upper Extremity Strength Normal and Lower Extremity Strength Normal. Normal Exam - Right-Upper Extremity Strength Normal and Lower Extremity Strength Normal.  Lymphatic Head & Neck  General Head & Neck Lymphatics: Bilateral - Description - Normal. Axillary  General Axillary Region: Bilateral - Description - Normal. Tenderness - Non Tender. Femoral & Inguinal  Generalized Femoral & Inguinal Lymphatics: Bilateral - Description - Normal. Tenderness - Non Tender.    Assessment & Plan  RIGHT INGUINAL HERNIA (K40.90) Impression: The patient has a known right inguinal hernia that is symptomatic. Since her last visit we were able to get cardiac clearance so she is ready to schedule. I have discussed with her in detail the risks and benefits of the operation to fix the hernia as well as some of the technical aspects including the use of mesh and she understands and wishes to proceed    Signed by Caleen EssexPaul S Toth, MD

## 2015-05-14 NOTE — Anesthesia Preprocedure Evaluation (Addendum)
Anesthesia Evaluation  Patient identified by MRN, date of birth, ID band Patient awake    Reviewed: Allergy & Precautions, H&P , NPO status , Patient's Chart, lab work & pertinent test results, reviewed documented beta blocker date and time   Airway Mallampati: III  TM Distance: >3 FB Neck ROM: Full    Dental no notable dental hx. (+) Teeth Intact, Dental Advisory Given,  Bruise on right lower lip.:   Pulmonary neg pulmonary ROS,    Pulmonary exam normal breath sounds clear to auscultation       Cardiovascular hypertension, Pt. on medications and Pt. on home beta blockers + dysrhythmias Atrial Fibrillation  Rhythm:Regular Rate:Normal     Neuro/Psych Anxiety negative neurological ROS  negative psych ROS   GI/Hepatic Neg liver ROS, PUD, GERD  Controlled,  Endo/Other  Hypothyroidism   Renal/GU negative Renal ROS  negative genitourinary   Musculoskeletal  (+) Arthritis , Osteoarthritis,    Abdominal   Peds  Hematology negative hematology ROS (+)   Anesthesia Other Findings   Reproductive/Obstetrics negative OB ROS                          Anesthesia Physical Anesthesia Plan  ASA: III  Anesthesia Plan: General and Regional   Post-op Pain Management: GA combined w/ Regional for post-op pain   Induction: Intravenous  Airway Management Planned: Oral ETT  Additional Equipment:   Intra-op Plan:   Post-operative Plan: Extubation in OR  Informed Consent: I have reviewed the patients History and Physical, chart, labs and discussed the procedure including the risks, benefits and alternatives for the proposed anesthesia with the patient or authorized representative who has indicated his/her understanding and acceptance.   Dental advisory given  Plan Discussed with: CRNA  Anesthesia Plan Comments:        Anesthesia Quick Evaluation

## 2015-05-14 NOTE — Op Note (Signed)
05/14/2015  11:27 AM  PATIENT:  Amanda [name]  82 y.o. female  PRE-OPERATIVE DIAGNOSIS:  Right inguinal hernia  POST-OPERATIVE DIAGNOSIS:  Right inguinal hernia  PROCEDURE:  Procedure(s): RIGHT INGUINAL HERNIA REPAIR WITH MESH (Right) INSERTION OF MESH (Right)  SURGEON:  Surgeon(s) and Role:    * Griselda MinerPaul Toth III, MD - Primary  PHYSICIAN ASSISTANT:   ASSISTANTS: none   ANESTHESIA:   general  EBL:  Total I/O In: 700 [I.V.:700] Out: 20 [Blood:20]  BLOOD ADMINISTERED:none  DRAINS: none   LOCAL MEDICATIONS USED:  MARCAINE     SPECIMEN:  No Specimen  DISPOSITION OF SPECIMEN:  N/A  COUNTS:  YES  TOURNIQUET:  * No tourniquets in log *  DICTATION: .Dragon Dictation   Informed consent was obtained the patient was brought to the operating room and placed in the supine position on the operating room table. After adequate induction of general anesthesia the patient's abdomen and right groin were prepped with ChloraPrep, allowed to dry, and draped in usual sterile manner. An appropriate timeout was performed. The right groin area was then infiltrated with quarter percent Marcaine. A small incision was made from the edge of the pubic tubercle on the right towards the anterior superior iliac spine. The incision was carried through the skin and subcutaneous tissue sharply with electrocautery until the fascia of the external oblique was encountered. A small bridging vein was clamped with hemostats, divided, and ligated with 3-0 silk ties. The external oblique fascia was then opened along its fibers towards the apex of the external ring with a 15 blade knife and Metzenbaum scissors. A Weitlan retractor was deployed. Blunt dissection was carried out in the inguinal canal until the hernia sac was identified. The hernia sac was opened and there were no visceral contents within the sac. The sac was ligated at is space with a 2-0 silk suture ligature. The distal sac was excised. The stump of the sac  was allowed to retract back beneath the transversalis muscle. The floor of the canal was then repaired with interrupted 0 Vicryl stitches. A 3 x 6 piece of ultra Pro mesh was chosen and cut to fit. The mesh was sewed inferiorly to the shelving edge of the inguinal ligament with a running 2-0 Prolene stitch. Superiorly the mesh was sewed to the musculoaponeurotic strength of the transversalis with interrupted 2-0 Prolene vertical mattress stitches. The ilioinguinal nerve was identified during the dissection and involved with scar tissue. It was dissected free proximally and distally, clamped, divided, and ligated with 3-0 silk ties. Once the mesh was secured and it was in good position and the hernia appeared well repaired. The wound was irrigated with copious amounts of saline. The external oblique fascia was then reapproximated with a running 2-0 Vicryl stitch. The wound was infiltrated with more quarter percent Marcaine. The subcutaneous fascia was closed with a running 3-0 Vicryl stitch. The skin was then closed with a running 4-0 Monocryl subcuticular stitch. Dermabond dressings were applied. The patient tolerated the procedure well. At the end of the case all needle sponge and instrument counts were correct. The patient was then awakened and taken to recovery in stable condition.  PLAN OF CARE: Admit for overnight observation  PATIENT DISPOSITION:  PACU - hemodynamically stable.   Delay start of Pharmacological VTE agent (>24hrs) due to surgical blood loss or risk of bleeding: no

## 2015-05-14 NOTE — Interval H&P Note (Signed)
History and Physical Interval Note:  05/14/2015 10:00 AM  Amanda [name]  has presented today for surgery, with the diagnosis of Right inguinal hernia  The various methods of treatment have been discussed with the patient and family. After consideration of risks, benefits and other options for treatment, the patient has consented to  Procedure(s): RIGHT INGUINAL HERNIA REPAIR WITH MESH (Right) INSERTION OF MESH (Right) as a surgical intervention .  The patient's history has been reviewed, patient examined, no change in status, stable for surgery.  I have reviewed the patient's chart and labs.  Questions were answered to the patient's satisfaction.     TOTH III,PAUL S

## 2015-05-14 NOTE — Transfer of Care (Signed)
Immediate Anesthesia Transfer of Care Note  Patient: Amanda [name]  Procedure(s) Performed: Procedure(s): RIGHT INGUINAL HERNIA REPAIR WITH MESH (Right) INSERTION OF MESH (Right)  Patient Location: PACU  Anesthesia Type:General and Regional  Level of Consciousness: awake, alert  and oriented  Airway & Oxygen Therapy: Patient Spontanous Breathing  Post-op Assessment: Report given to RN, Post -op Vital signs reviewed and stable and Patient moving all extremities X 4  Post vital signs: Reviewed and stable  Last Vitals:  Filed Vitals:   05/14/15 1000 05/14/15 1136  BP: 192/64 132/76  Pulse: 103 105  Temp:    Resp: 21 13    Complications: No apparent anesthesia complications

## 2015-05-15 ENCOUNTER — Encounter (HOSPITAL_COMMUNITY): Payer: Self-pay | Admitting: General Surgery

## 2015-05-15 DIAGNOSIS — K4090 Unilateral inguinal hernia, without obstruction or gangrene, not specified as recurrent: Secondary | ICD-10-CM | POA: Diagnosis not present

## 2015-05-15 MED ORDER — PANTOPRAZOLE SODIUM 40 MG PO TBEC: 40.0000 mg | DELAYED_RELEASE_TABLET | Freq: Every day | ORAL | Status: DC

## 2015-05-15 NOTE — Progress Notes (Signed)
1 Day Post-Op  Subjective: Doing well. Pain controlled with tylenol  Objective: Vital signs in last 24 hours: Temp:  [97.5 F (36.4 C)-98 F (36.7 C)] 97.5 F (36.4 C) (03/09 0656) Pulse Rate:  [40-105] 69 (03/09 0656) Resp:  [13-25] 20 (03/09 0656) BP: (116-192)/(64-101) 123/79 mmHg (03/09 0656) SpO2:  [94 %-100 %] 98 % (03/09 0656) Weight:  [55.339 kg (122 lb)] 55.339 kg (122 lb) (03/08 0923) Last BM Date: 05/13/15  Intake/Output from previous day: 03/08 0701 - 03/09 0700 In: 887 [P.O.:120; I.V.:767] Out: 20 [Blood:20] Intake/Output this shift: Total I/O In: 99.8 [I.V.:99.8] Out: -   Resp: clear to auscultation bilaterally Cardio: regular rate and rhythm GI: soft, nontender. incision looks good  Lab Results:  No results for input(Amanda [name]): WBC, HGB, HCT, PLT in the last 72 hours. BMET No results for input(Amanda [name]): NA, K, CL, CO2, GLUCOSE, BUN, CREATININE, CALCIUM in the last 72 hours. PT/INR  Recent Labs  05/14/15 0903  LABPROT 15.4*  INR 1.20   ABG No results for input(Amanda [name]): PHART, HCO3 in the last 72 hours.  Invalid input(Amanda [name]): PCO2, PO2  Studies/Results: No results found.  Anti-infectives: Anti-infectives    Start     Dose/Rate Route Frequency Ordered Stop   05/14/15 1000  ceFAZolin (ANCEF) IVPB 2 g/50 mL premix     2 g 100 mL/hr over 30 Minutes Intravenous To ShortStay Surgical 05/13/15 1300 05/14/15 1032      Assessment/Plan: Amanda [name]/p Procedure(Amanda [name]): RIGHT INGUINAL HERNIA REPAIR WITH MESH (Right) INSERTION OF MESH (Right) Advance diet Discharge     TOTH III,Amanda Amanda [name] 05/15/2015

## 2015-05-15 NOTE — Progress Notes (Signed)
IV removed. Discharge paperwork reviewed with patient and family. No questions verbalized. Ready for discharge.

## 2016-06-29 ENCOUNTER — Encounter (HOSPITAL_COMMUNITY): Payer: Self-pay | Admitting: Emergency Medicine

## 2016-06-29 ENCOUNTER — Emergency Department (HOSPITAL_COMMUNITY)
Admission: EM | Admit: 2016-06-29 | Discharge: 2016-06-29 | Disposition: A | Discharge status: Home / Self Care | Payer: Medicare Other | Attending: Emergency Medicine | Admitting: Emergency Medicine

## 2016-06-29 DIAGNOSIS — E039 Hypothyroidism, unspecified: Secondary | ICD-10-CM | POA: Insufficient documentation

## 2016-06-29 DIAGNOSIS — Z79899 Other long term (current) drug therapy: Secondary | ICD-10-CM | POA: Insufficient documentation

## 2016-06-29 DIAGNOSIS — I10 Essential (primary) hypertension: Secondary | ICD-10-CM | POA: Insufficient documentation

## 2016-06-29 DIAGNOSIS — M545 Low back pain, unspecified: Secondary | ICD-10-CM

## 2016-06-29 DIAGNOSIS — Z7901 Long term (current) use of anticoagulants: Secondary | ICD-10-CM | POA: Insufficient documentation

## 2016-06-29 DIAGNOSIS — Z96641 Presence of right artificial hip joint: Secondary | ICD-10-CM | POA: Insufficient documentation

## 2016-06-29 LAB — URINALYSIS, ROUTINE W REFLEX MICROSCOPIC
Bilirubin Urine: NEGATIVE
Glucose, UA: NEGATIVE mg/dL
Hgb urine dipstick: NEGATIVE
Ketones, ur: 5 mg/dL — AB
Leukocytes, UA: NEGATIVE
Nitrite: NEGATIVE
PH: 8 (ref 5.0–8.0)
Protein, ur: 100 mg/dL — AB
SPECIFIC GRAVITY, URINE: 1.01 (ref 1.005–1.030)
Squamous Epithelial / LPF: NONE SEEN

## 2016-06-29 MED ORDER — DICLOFENAC SODIUM 1 % TD GEL
4.0000 g | Freq: Four times a day (QID) | TRANSDERMAL | Status: DC
  Administered 2016-06-29: 4 g via TOPICAL
  Filled 2016-06-29: qty 100

## 2016-06-29 MED ORDER — LIDOCAINE 5 % EX PTCH: 1.0000 | MEDICATED_PATCH | CUTANEOUS | 0 refills | Status: DC

## 2016-06-29 NOTE — ED Notes (Signed)
Pt ambulatory and independent at discharge.  Verbalized understanding of discharge instructions 

## 2016-06-29 NOTE — ED Provider Notes (Signed)
[facility]-EMERGENCY DEPT Provider Note   CSN: [id number] Arrival date & time: 06/29/16  1117     History   Chief Complaint Chief Complaint  Patient presents with  . Back Pain    HPI Amanda [name] is a 83 y.o. female.  The history is provided by the patient.  Back Pain   This is a new problem. The current episode started 2 days ago. The problem occurs constantly. The problem has not changed since onset.The pain is associated with no known injury. The pain is present in the lumbar spine. The quality of the pain is described as aching. The pain does not radiate. The pain is moderate. The symptoms are aggravated by bending, twisting and certain positions. The pain is the same all the time. Pertinent negatives include no fever, no abdominal pain, no bowel incontinence, no perianal numbness and no bladder incontinence. She has tried nothing for the symptoms. Risk factors: elderly.    Past Medical History:  Diagnosis Date  . Anxiety   . Arthritis   . Atrial fibrillation (HCC)   . Atrial fibrillation (HCC)   . Back pain   . Dysrhythmia   . GERD (gastroesophageal reflux disease)   . HLD (hyperlipidemia)   . Hypertension   . Hypothyroidism   . Insomnia   . Memory impairment   . Osteoarthritis    knee  . Osteoporosis   . PUD (peptic ulcer disease)    hx of upper GI bleeding   . Wears glasses     Patient Active Problem List   Diagnosis Date Noted  . Right inguinal hernia 05/14/2015  . Atrial fibrillation, persistent (HCC) 02/21/2015  . Femoral neck fracture (HCC) 04/23/2014  . Hip fracture due to osteoporosis (HCC) 04/23/2014  . Long term current use of anticoagulant therapy 10/07/2010  . Essential hypertension 07/21/2009  . CHEST PAIN 07/11/2009  . Hypothyroidism 07/03/2009  . Hyperlipidemia 07/03/2009  . Atrial fibrillation (HCC) 07/03/2009  . GERD 07/03/2009  . OSTEOARTHRITIS, KNEE 07/03/2009  . BACK PAIN 07/03/2009  . Osteoporosis 07/03/2009    Past Surgical  History:  Procedure Laterality Date  . COLONOSCOPY    . ESOPHAGOGASTRODUODENOSCOPY    . HIP ARTHROPLASTY Right 04/25/2014   Procedure: ARTHROPLASTY BIPOLAR HIP;  Surgeon: Sheral Apley, MD;  Location: [facility] OR;  Service: Orthopedics;  Laterality: Right;  . INGUINAL HERNIA REPAIR Right 05/14/2015   Procedure: RIGHT INGUINAL HERNIA REPAIR WITH MESH;  Surgeon: Chevis Pretty III, MD;  Location: [facility] OR;  Service: General;  Laterality: Right;  . INSERTION OF MESH Right 05/14/2015   Procedure: INSERTION OF MESH;  Surgeon: Chevis Pretty III, MD;  Location: [facility] OR;  Service: General;  Laterality: Right;  . tumor removed from right side of face      OB History    No data available       Home Medications    Prior to Admission medications   Medication Sig Start Date End Date Taking? Authorizing Provider  acetaminophen (TYLENOL) 325 MG tablet Take 650 mg by mouth every 6 (six) hours as needed.    Historical Provider, MD  folic acid (FOLVITE) 400 MCG tablet Take 400 mcg by mouth daily. 04/23/15   Historical Provider, MD  HYDROcodone-acetaminophen (NORCO) 5-325 MG tablet Take 1-2 tablets by mouth every 6 (six) hours as needed. 05/14/15   Chevis Pretty III, MD  levothyroxine (SYNTHROID, LEVOTHROID) 75 MCG tablet Take 75 mcg by mouth daily.      Historical Provider, MD  metoprolol tartrate (  LOPRESSOR) 25 MG tablet TAKE ONE TABLET BY MOUTH TWICE DAILY-MUST BE SEEN Patient taking differently: TAKE ONE TABLET BY MOUTH TWICE DAILY 06/14/13   Lars Masson, MD  pravastatin (PRAVACHOL) 40 MG tablet Take 40 mg by mouth daily.     Historical Provider, MD  QUEtiapine (SEROQUEL) 25 MG tablet Take 25 mg by mouth 2 (two) times daily.     Historical Provider, MD  warfarin (COUMADIN) 5 MG tablet Take 2.5-5 mg by mouth daily at 6 PM. 5 mg on Mon and Fri.  2.5 mg all other days    Historical Provider, MD    Family History Family History  Problem Relation Age of Onset  . Kidney cancer      family hx  . Coronary artery disease       family hx  . Arthritis      family hx    Social History Social History  Substance Use Topics  . Smoking status: Never Smoker  . Smokeless tobacco: Never Used     Comment: tobacco use - no  . Alcohol use No     Allergies   Alendronate; Aspirin; Aspirin-acetaminophen-caffeine; Nsaids; Sulfamethoxazole; Sulfonamide derivatives; Cephalexin; and Codeine   Review of Systems Review of Systems  Constitutional: Negative for fever.  Gastrointestinal: Negative for abdominal pain and bowel incontinence.  Genitourinary: Negative for bladder incontinence.  Musculoskeletal: Positive for back pain.  All other systems reviewed and are negative.    Physical Exam Updated Vital Signs BP (!) 173/99 (BP Location: Left Arm)   Pulse 87   Temp 97.7 F (36.5 C) (Oral)   Resp 18   Ht  (1.676 m)   Wt 125 lb (56.7 kg)   SpO2 100%   BMI 20.18 kg/m   Physical Exam  Constitutional: She is oriented to person, place, and time. She appears well-developed and well-nourished. No distress.  HENT:  Head: Normocephalic.  Nose: Nose normal.  Eyes: Conjunctivae are normal.  Neck: Neck supple. No tracheal deviation present.  Cardiovascular: Normal rate, regular rhythm and normal heart sounds.   Pulmonary/Chest: Effort normal and breath sounds normal. No respiratory distress.  Abdominal: Soft. She exhibits no distension.  Musculoskeletal:  Bilateral lumbar paraspinal tenderness, no midline tenderness  Neurological: She is alert and oriented to person, place, and time.  Skin: Skin is warm and dry.  Psychiatric: She has a normal mood and affect.  Vitals reviewed.    ED Treatments / Results  Labs (all labs ordered are listed, but only abnormal results are displayed) Labs Reviewed  URINALYSIS, ROUTINE W REFLEX MICROSCOPIC - Abnormal; Notable for the following:       Result Value   Ketones, ur 5 (*)    Protein, ur 100 (*)    Bacteria, UA RARE (*)    All other components within normal  limits    EKG  EKG Interpretation None       Radiology No results found.  Procedures Procedures (including critical care time)  Medications Ordered in ED Medications - No data to display   Initial Impression / Assessment and Plan / ED Course  I have reviewed the triage vital signs and the nursing notes.  Pertinent labs & imaging results that were available during my care of the patient were reviewed by me and considered in my medical decision making (see chart for details).     83 y.o. female presents with back pain in lumbar area since 2 days ago without signs of radicular pain. No acute  traumatic onset. No red flag symptoms of fever, weight loss, saddle anesthesia, weakness, fecal/urinary incontinence or urinary retention. Offered local trigger point injection for symptomatic treatment. Suspect MSK etiology. No indication for imaging emergently. Patient was recommended to take short course of scheduled NSAIDs and engage in early mobility as definitive treatment. Return precautions discussed for worsening or new concerning symptoms.    Final Clinical Impressions(s) / ED Diagnoses   Final diagnoses:  Acute bilateral low back pain without sciatica    New Prescriptions New Prescriptions   No medications on file     Lyndal Pulley, MD 06/29/16 1835

## 2016-06-29 NOTE — ED Triage Notes (Signed)
Per pt, states lower back pain since Sunday-no trauma, no dysuria-states 10/10 pain

## 2016-07-06 ENCOUNTER — Encounter (HOSPITAL_COMMUNITY): Payer: Self-pay | Admitting: *Deleted

## 2016-07-06 ENCOUNTER — Emergency Department (HOSPITAL_COMMUNITY)
Admission: EM | Admit: 2016-07-06 | Discharge: 2016-07-06 | Disposition: A | Discharge status: Home / Self Care | Payer: Medicare Other | Attending: Emergency Medicine | Admitting: Emergency Medicine

## 2016-07-06 ENCOUNTER — Emergency Department (HOSPITAL_COMMUNITY): Payer: Medicare Other

## 2016-07-06 ENCOUNTER — Emergency Department (HOSPITAL_COMMUNITY)
Admission: EM | Admit: 2016-07-06 | Discharge: 2016-07-06 | Discharge status: Against Medical Advice | Payer: Medicare Other

## 2016-07-06 ENCOUNTER — Encounter (HOSPITAL_COMMUNITY): Payer: Self-pay | Admitting: Emergency Medicine

## 2016-07-06 DIAGNOSIS — Z96641 Presence of right artificial hip joint: Secondary | ICD-10-CM | POA: Insufficient documentation

## 2016-07-06 DIAGNOSIS — Z7901 Long term (current) use of anticoagulants: Secondary | ICD-10-CM | POA: Diagnosis not present

## 2016-07-06 DIAGNOSIS — Z79899 Other long term (current) drug therapy: Secondary | ICD-10-CM | POA: Diagnosis not present

## 2016-07-06 DIAGNOSIS — E039 Hypothyroidism, unspecified: Secondary | ICD-10-CM | POA: Insufficient documentation

## 2016-07-06 DIAGNOSIS — I10 Essential (primary) hypertension: Secondary | ICD-10-CM | POA: Insufficient documentation

## 2016-07-06 DIAGNOSIS — M48061 Spinal stenosis, lumbar region without neurogenic claudication: Secondary | ICD-10-CM

## 2016-07-06 DIAGNOSIS — Y939 Activity, unspecified: Secondary | ICD-10-CM | POA: Insufficient documentation

## 2016-07-06 DIAGNOSIS — Y929 Unspecified place or not applicable: Secondary | ICD-10-CM | POA: Insufficient documentation

## 2016-07-06 DIAGNOSIS — S3992XA Unspecified injury of lower back, initial encounter: Secondary | ICD-10-CM | POA: Diagnosis present

## 2016-07-06 DIAGNOSIS — S32028A Other fracture of second lumbar vertebra, initial encounter for closed fracture: Secondary | ICD-10-CM | POA: Diagnosis not present

## 2016-07-06 DIAGNOSIS — Y999 Unspecified external cause status: Secondary | ICD-10-CM | POA: Insufficient documentation

## 2016-07-06 DIAGNOSIS — S32048A Other fracture of fourth lumbar vertebra, initial encounter for closed fracture: Secondary | ICD-10-CM | POA: Insufficient documentation

## 2016-07-06 DIAGNOSIS — X58XXXA Exposure to other specified factors, initial encounter: Secondary | ICD-10-CM | POA: Insufficient documentation

## 2016-07-06 DIAGNOSIS — S32040A Wedge compression fracture of fourth lumbar vertebra, initial encounter for closed fracture: Secondary | ICD-10-CM

## 2016-07-06 DIAGNOSIS — S32020A Wedge compression fracture of second lumbar vertebra, initial encounter for closed fracture: Secondary | ICD-10-CM

## 2016-07-06 DIAGNOSIS — M545 Low back pain, unspecified: Secondary | ICD-10-CM

## 2016-07-06 MED ORDER — PREDNISONE 10 MG PO TABS: 10.0000 mg | ORAL_TABLET | Freq: Every day | ORAL | 0 refills | Status: DC

## 2016-07-06 MED ORDER — HYDROCODONE-ACETAMINOPHEN 5-325 MG PO TABS
1.0000 | ORAL_TABLET | Freq: Once | ORAL | Status: AC
Start: 2016-07-06 — End: 2016-07-06
  Administered 2016-07-06: 1 via ORAL
  Filled 2016-07-06: qty 1

## 2016-07-06 MED ORDER — HYDROCODONE-ACETAMINOPHEN 5-325 MG PO TABS: 1.0000 | ORAL_TABLET | ORAL | 0 refills | Status: DC | PRN

## 2016-07-06 MED ORDER — ACETAMINOPHEN 325 MG PO TABS
650.0000 mg | ORAL_TABLET | Freq: Once | ORAL | Status: AC
  Administered 2016-07-06: 650 mg via ORAL
  Filled 2016-07-06: qty 2

## 2016-07-06 NOTE — Discharge Instructions (Signed)
You have a compression fracture of lumbar 2 and lumbar 4 vertebrae. Also you have a condition called spinal stenosis which is a narrowing of the spinal canal putting pressure on the nerves to the back. Prescription for prednisone and pain medicine. Follow-up your primary care doctor.

## 2016-07-06 NOTE — ED Triage Notes (Signed)
Patient c/o back pain x1 week. D/C today but son reports patient is still hurting. Denies injury.

## 2016-07-06 NOTE — ED Notes (Signed)
Pt in MRI. Unable to obtain vital signs.

## 2016-07-06 NOTE — ED Triage Notes (Signed)
Pt presents with back pain x 1 week.  Pt denies any trauma to the area.  Pt recently seen here for the same and given a topical cream without any relief. Pt has been taking tylenol and tramadol without relief.  Pt a/o x 4 and able to bear weight on legs.

## 2016-07-07 NOTE — ED Provider Notes (Signed)
[facility]-EMERGENCY DEPT Provider Note   CSN: [id number] Arrival date & time: 07/06/16  0706     History   Chief Complaint Chief Complaint  Patient presents with  . Back Pain    HPI Amanda [name] is a 83 y.o. female.  Patient complains of low back pain for several weeks, getting worse. No trauma. No bowel or bladder incontinence. Pain is worse with sitting and ambulating. She has tried tramadol but this made her feel dizzy. Recent visit to the ED for similar symptoms.      Past Medical History:  Diagnosis Date  . Anxiety   . Arthritis   . Atrial fibrillation (HCC)   . Atrial fibrillation (HCC)   . Back pain   . Dysrhythmia   . GERD (gastroesophageal reflux disease)   . HLD (hyperlipidemia)   . Hypertension   . Hypothyroidism   . Insomnia   . Memory impairment   . Osteoarthritis    knee  . Osteoporosis   . PUD (peptic ulcer disease)    hx of upper GI bleeding   . Wears glasses     Patient Active Problem List   Diagnosis Date Noted  . Right inguinal hernia 05/14/2015  . Atrial fibrillation, persistent (HCC) 02/21/2015  . Femoral neck fracture (HCC) 04/23/2014  . Hip fracture due to osteoporosis (HCC) 04/23/2014  . Long term current use of anticoagulant therapy 10/07/2010  . Essential hypertension 07/21/2009  . CHEST PAIN 07/11/2009  . Hypothyroidism 07/03/2009  . Hyperlipidemia 07/03/2009  . Atrial fibrillation (HCC) 07/03/2009  . GERD 07/03/2009  . OSTEOARTHRITIS, KNEE 07/03/2009  . BACK PAIN 07/03/2009  . Osteoporosis 07/03/2009    Past Surgical History:  Procedure Laterality Date  . COLONOSCOPY    . ESOPHAGOGASTRODUODENOSCOPY    . HIP ARTHROPLASTY Right 04/25/2014   Procedure: ARTHROPLASTY BIPOLAR HIP;  Surgeon: Sheral Apley, MD;  Location: [facility] OR;  Service: Orthopedics;  Laterality: Right;  . INGUINAL HERNIA REPAIR Right 05/14/2015   Procedure: RIGHT INGUINAL HERNIA REPAIR WITH MESH;  Surgeon: Chevis Pretty III, MD;  Location: [facility] OR;  Service: General;   Laterality: Right;  . INSERTION OF MESH Right 05/14/2015   Procedure: INSERTION OF MESH;  Surgeon: Chevis Pretty III, MD;  Location: [facility] OR;  Service: General;  Laterality: Right;  . tumor removed from right side of face      OB History    No data available       Home Medications    Prior to Admission medications   Medication Sig Start Date End Date Taking? Authorizing Provider  acetaminophen (TYLENOL) 325 MG tablet Take 650 mg by mouth every 6 (six) hours as needed for moderate pain.    Yes Historical Provider, MD  levothyroxine (SYNTHROID, LEVOTHROID) 75 MCG tablet Take 75 mcg by mouth daily.     Yes Historical Provider, MD  lidocaine (LIDODERM) 5 % Place 1 patch onto the skin daily. Remove & Discard patch within 12 hours or as directed by MD 06/29/16  Yes Lyndal Pulley, MD  metoprolol tartrate (LOPRESSOR) 25 MG tablet TAKE ONE TABLET BY MOUTH TWICE DAILY-MUST BE SEEN Patient taking differently: TAKE ONE TABLET BY MOUTH TWICE DAILY 06/14/13  Yes Lars Masson, MD  pravastatin (PRAVACHOL) 40 MG tablet Take 40 mg by mouth every evening.    Yes Historical Provider, MD  QUEtiapine (SEROQUEL) 25 MG tablet Take 25 mg by mouth 2 (two) times daily.    Yes Historical Provider, MD  traMADol (ULTRAM) 50 MG tablet  Take one tablet every 6 hours as needed for pain. 07/02/16  Yes Historical Provider, MD  warfarin (COUMADIN) 5 MG tablet Take 2.5-5 mg by mouth daily at 6 PM. 2.5 mg on Mon,wed and Fri. 5 mg all other days   Yes Historical Provider, MD  HYDROcodone-acetaminophen (NORCO/VICODIN) 5-325 MG tablet Take 1 tablet by mouth every 4 (four) hours as needed. 07/06/16   Donnetta Hutching, MD  predniSONE (DELTASONE) 10 MG tablet Take 1 tablet (10 mg total) by mouth daily with breakfast. 07/06/16   Donnetta Hutching, MD    Family History Family History  Problem Relation Age of Onset  . Kidney cancer      family hx  . Coronary artery disease      family hx  . Arthritis      family hx    Social History Social  History  Substance Use Topics  . Smoking status: Never Smoker  . Smokeless tobacco: Never Used     Comment: tobacco use - no  . Alcohol use No     Allergies   Alendronate; Aspirin; Aspirin-acetaminophen-caffeine; Nsaids; Sulfamethoxazole; Sulfonamide derivatives; Cephalexin; and Codeine   Review of Systems Review of Systems  All other systems reviewed and are negative.    Physical Exam Updated Vital Signs BP (!) 162/84 (BP Location: Left Arm)   Pulse 87   Temp 97.7 F (36.5 C) (Oral)   Resp 18   SpO2 96%   Physical Exam  Constitutional: She is oriented to person, place, and time.  Pleasant, cooperative  HENT:  Head: Normocephalic and atraumatic.  Eyes: Conjunctivae are normal.  Neck: Neck supple.  Cardiovascular: Normal rate and regular rhythm.   Pulmonary/Chest: Effort normal and breath sounds normal.  Abdominal: Soft. Bowel sounds are normal.  Musculoskeletal:  Generalized tenderness in the lower back.  Neurological: She is alert and oriented to person, place, and time.  Skin: Skin is warm and dry.  Psychiatric: She has a normal mood and affect. Her behavior is normal.  Nursing note and vitals reviewed.    ED Treatments / Results  Labs (all labs ordered are listed, but only abnormal results are displayed) Labs Reviewed - No data to display  EKG  EKG Interpretation None       Radiology Dg Lumbar Spine Complete  Result Date: 07/06/2016 CLINICAL DATA:  Low back pain without known injury. EXAM: LUMBAR SPINE - COMPLETE 4+ VIEW COMPARISON:  None. FINDINGS: Mild grade 1 retrolisthesis of L2-3 is noted secondary to degenerative disc disease at this level. Severe degenerative disc disease is noted at T12-L1, L1-2 and L2-3. Mild degenerative disc disease is noted at L4-5. Moderate compression deformity L4 vertebral body is noted consistent with fracture of indeterminate age. IMPRESSION: Severe multilevel degenerative disc disease. Moderate compression deformity L4  vertebral body is noted which may represent old fracture, but if patient is symptomatic in this area, MRI is recommended to evaluate for possible acute fracture. Electronically Signed   By: Lupita Raider, M.D.   On: 07/06/2016 09:41   Mr Lumbar Spine Wo Contrast  Result Date: 07/06/2016 CLINICAL DATA:  83 year old female with lumbar back pain. Age indeterminate L4 compression on lumbar radiographs today. EXAM: MRI LUMBAR SPINE WITHOUT CONTRAST TECHNIQUE: Multiplanar, multisequence MR imaging of the lumbar spine was performed. No intravenous contrast was administered. COMPARISON:  Lumbar radiographs 0921 hours today. FINDINGS: Segmentation: Normal is demonstrated on the comparison radiographs today. Alignment: Subtle grade 1 anterolisthesis at L5-S1. Straightening of lumbar lordosis. Mild retrolisthesis of L2  on L3, and L1 on L2. Vertebrae: Moderate L4 compression fracture is chronic with no marrow edema. Mild L2 inferior endplate compression fracture is chronic. Visualized bone marrow signal is within normal limits. No marrow edema or evidence of acute osseous abnormality. Visible sacrum and SI joints are intact. Conus medullaris: Extends to the T12 level and appears normal. Paraspinal and other soft tissues: Negative for age visible abdominal viscera. Distal colon diverticulosis with no active inflammation identified. Negative for age posterior paraspinal soft tissues. Disc levels: T11-T12:  Mild disc bulge. T12-L1: Disc space loss with vacuum disc and circumferential disc osteophyte complex. No stenosis. L1-L2: Disc desiccation with right eccentric circumferential disc osteophyte complex. No stenosis. L2-L3: Circumferential disc bulge eccentric to the right with broad-based posterior component and widespread endplate spurring. Mild facet and moderate ligament flavum hypertrophy. Borderline to mild spinal and bilateral L2 foraminal stenosis. L3-L4: Disc space loss with vacuum disc. Left eccentric  circumferential disc bulge with broad-based posterior component. Mild endplate spurring. Moderate facet and ligament flavum hypertrophy. Moderate spinal stenosis (series 6, image 16). Mild left greater than right L3 foraminal stenosis. L4-L5: Disc space loss with left eccentric circumferential disc bulge and endplate spurring. Broad-based posterior and left far lateral components. Moderate to severe facet and ligament flavum hypertrophy. Severe spinal stenosis (series 6, image 21). Moderate to severe left and mild to moderate right L4 foraminal stenosis. L5-S1: Circumferential disc bulge with broad-based posterior component. Severe facet hypertrophy. Right facet joint fluid. Moderate to severe right greater than left lateral recess stenosis without significant spinal stenosis. Moderate to severe left and mild to moderate right L5 foraminal stenosis. IMPRESSION: 1. No acute osseous abnormality identified. Chronic L2 and L4 compression fractures. 2. Multifactorial severe spinal stenosis at L4-L5, and moderate to severe lateral recess stenosis at L5-S1. Moderate to severe neural foraminal stenosis at both levels, worse on the left. 3. Moderate spinal stenosis at L3-L4. Mild spinal stenosis at L2-L3. Electronically Signed   By: Odessa Fleming M.D.   On: 07/06/2016 13:15    Procedures Procedures (including critical care time)  Medications Ordered in ED Medications  acetaminophen (TYLENOL) tablet 650 mg (650 mg Oral Given 07/06/16 1158)  HYDROcodone-acetaminophen (NORCO/VICODIN) 5-325 MG per tablet 1 tablet (1 tablet Oral Given 07/06/16 1453)     Initial Impression / Assessment and Plan / ED Course  I have reviewed the triage vital signs and the nursing notes.  Pertinent labs & imaging results that were available during my care of the patient were reviewed by me and considered in my medical decision making (see chart for details).     MRI of the lumbar spine reveals chronic L2 and L4 compression fractures along  with spinal stenosis at L4-L5 and other findings documented above. This likely explains her pain. Discharge medication prednisone and Vicodin. She will follow-up with her primary care doctor.  Final Clinical Impressions(s) / ED Diagnoses   Final diagnoses:  Lumbar back pain  Closed compression fracture of second lumbar vertebra, initial encounter (HCC)  Closed compression fracture of fourth lumbar vertebra, initial encounter [facility])  Spinal stenosis of lumbar region, unspecified whether neurogenic claudication present    New Prescriptions Discharge Medication List as of 07/06/2016  2:33 PM    START taking these medications   Details  predniSONE (DELTASONE) 10 MG tablet Take 1 tablet (10 mg total) by mouth daily with breakfast., Starting Tue 07/06/2016, Print         Donnetta Hutching, MD 07/07/16 0930

## 2016-07-09 ENCOUNTER — Emergency Department (HOSPITAL_COMMUNITY)
Admission: EM | Admit: 2016-07-09 | Discharge: 2016-07-09 | Disposition: A | Discharge status: Home / Self Care | Payer: Medicare Other | Attending: Emergency Medicine | Admitting: Emergency Medicine

## 2016-07-09 ENCOUNTER — Encounter (HOSPITAL_COMMUNITY): Payer: Self-pay | Admitting: Emergency Medicine

## 2016-07-09 DIAGNOSIS — S32040G Wedge compression fracture of fourth lumbar vertebra, subsequent encounter for fracture with delayed healing: Secondary | ICD-10-CM

## 2016-07-09 DIAGNOSIS — X58XXXD Exposure to other specified factors, subsequent encounter: Secondary | ICD-10-CM | POA: Diagnosis not present

## 2016-07-09 DIAGNOSIS — E039 Hypothyroidism, unspecified: Secondary | ICD-10-CM | POA: Diagnosis not present

## 2016-07-09 DIAGNOSIS — Z79899 Other long term (current) drug therapy: Secondary | ICD-10-CM | POA: Diagnosis not present

## 2016-07-09 DIAGNOSIS — I10 Essential (primary) hypertension: Secondary | ICD-10-CM | POA: Diagnosis not present

## 2016-07-09 DIAGNOSIS — S32048G Other fracture of fourth lumbar vertebra, subsequent encounter for fracture with delayed healing: Secondary | ICD-10-CM | POA: Diagnosis not present

## 2016-07-09 DIAGNOSIS — S32020G Wedge compression fracture of second lumbar vertebra, subsequent encounter for fracture with delayed healing: Secondary | ICD-10-CM

## 2016-07-09 DIAGNOSIS — S32028G Other fracture of second lumbar vertebra, subsequent encounter for fracture with delayed healing: Secondary | ICD-10-CM | POA: Insufficient documentation

## 2016-07-09 DIAGNOSIS — Z7901 Long term (current) use of anticoagulants: Secondary | ICD-10-CM | POA: Diagnosis not present

## 2016-07-09 MED ORDER — DOCUSATE SODIUM 100 MG PO CAPS
100.0000 mg | ORAL_CAPSULE | Freq: Once | ORAL | Status: AC
  Administered 2016-07-09: 100 mg via ORAL
  Filled 2016-07-09: qty 1

## 2016-07-09 MED ORDER — FENTANYL CITRATE (PF) 100 MCG/2ML IJ SOLN
100.0000 ug | Freq: Once | INTRAMUSCULAR | Status: AC
  Administered 2016-07-09: 100 ug via INTRAVENOUS
  Filled 2016-07-09: qty 2

## 2016-07-09 MED ORDER — OXYCODONE-ACETAMINOPHEN 5-325 MG PO TABS
1.0000 | ORAL_TABLET | Freq: Once | ORAL | Status: AC
  Administered 2016-07-09: 1 via ORAL
  Filled 2016-07-09: qty 1

## 2016-07-09 MED ORDER — DOCUSATE SODIUM 250 MG PO CAPS: 250.0000 mg | ORAL_CAPSULE | Freq: Every day | ORAL | 0 refills | Status: DC

## 2016-07-09 MED ORDER — OXYCODONE-ACETAMINOPHEN 5-325 MG PO TABS: 1.0000 | ORAL_TABLET | Freq: Four times a day (QID) | ORAL | 0 refills | Status: DC | PRN

## 2016-07-09 NOTE — ED Notes (Signed)
[employer] representative applied lumbar corsett.

## 2016-07-09 NOTE — Progress Notes (Signed)
[facility] Anticipated DC date: 07/09/2016 Family notified: Patient, Patient's son, and Patient's spouse Transport by: Sharin MonsPTAR  RN to call nurse to nurse report to [phone]. Patient's son to complete admission paperwork at 1600. Patient to be transported at 1700. CSW has updated bedside RN.    Enos FlingAshley Burl Tauzin, MSW, LCSW [facility] ED/23M Clinical Social Worker [phone]

## 2016-07-09 NOTE — ED Notes (Signed)
Family concerned about patient being discharged, EDP made aware.

## 2016-07-09 NOTE — NC FL2 (Signed)
[place] MEDICAID FL2 LEVEL OF CARE SCREENING TOOL     IDENTIFICATION  Patient Name: Amanda [name] Birthdate: 10/22/1922 Sex: female Admission Date (Current Location): 07/09/2016  [facility] [place] [place]   Facility and Address:  [facility] [place], [address], [place], [place] [address]      Provider Number: [id number]  Attending Physician Name and Address:  Gerhard Munchobert Lockwood, MD  Relative Name and Phone Number:  Theda Belfastommy Becknell (son) [phone]    Current Level of Care: Hospital Recommended Level of Care: Skilled Nursing Facility Prior Approval Number:    Date Approved/Denied:   PASRR Number: [id number]  Discharge Plan:      Current Diagnoses: Patient Active Problem List   Diagnosis Date Noted  . Right inguinal hernia 05/14/2015  . Atrial fibrillation, persistent (HCC) 02/21/2015  . Femoral neck fracture (HCC) 04/23/2014  . Hip fracture due to osteoporosis (HCC) 04/23/2014  . Long term current use of anticoagulant therapy 10/07/2010  . Essential hypertension 07/21/2009  . CHEST PAIN 07/11/2009  . Hypothyroidism 07/03/2009  . Hyperlipidemia 07/03/2009  . Atrial fibrillation (HCC) 07/03/2009  . GERD 07/03/2009  . OSTEOARTHRITIS, KNEE 07/03/2009  . BACK PAIN 07/03/2009  . Osteoporosis 07/03/2009    Orientation RESPIRATION BLADDER Height & Weight     Self, Situation, Place  Normal Continent Weight: 125 lb (56.7 kg) Height:  5\' 6"  (167.6 cm)  BEHAVIORAL SYMPTOMS/MOOD NEUROLOGICAL BOWEL NUTRITION STATUS   (NONE)  (NONE) Continent Diet  AMBULATORY STATUS COMMUNICATION OF NEEDS Skin   Extensive Assist Verbally Normal                       Personal Care Assistance Level of Assistance  Bathing, Dressing, Feeding Bathing Assistance: Limited assistance Feeding assistance: Independent Dressing Assistance: Limited assistance     Functional Limitations Info  Sight, Hearing, Speech Sight Info: Adequate (wears  glasses) Hearing Info: Adequate Speech Info: Adequate    SPECIAL CARE FACTORS FREQUENCY  PT (By licensed PT)     PT Frequency: min 5x/week               Contractures Contractures Info: Not present    Additional Factors Info  Code Status, Allergies, Psychotropic Code Status Info: FULL Allergies Info: Sulfamethoxazole; Sulfonamide Derivatives; Cephalexin; Alendronate; Aspirin; Aspirin-Acetaminophen Caffeine; Nsaids; Codiene Psychotropic Info: Seroquel         Current Medications (07/09/2016):  This is the current hospital active medication list No current facility-administered medications for this encounter.    Current Outpatient Prescriptions  Medication Sig Dispense Refill  . acetaminophen (TYLENOL) 325 MG tablet Take 650 mg by mouth every 6 (six) hours as needed for moderate pain.     Marland Kitchen. HYDROcodone-acetaminophen (NORCO/VICODIN) 5-325 MG tablet Take 1 tablet by mouth every 4 (four) hours as needed. (Patient taking differently: Take 1 tablet by mouth every 4 (four) hours as needed for moderate pain. ) 20 tablet 0  . levothyroxine (SYNTHROID, LEVOTHROID) 75 MCG tablet Take 75 mcg by mouth daily.      . metoprolol tartrate (LOPRESSOR) 25 MG tablet TAKE ONE TABLET BY MOUTH TWICE DAILY-MUST BE SEEN (Patient taking differently: TAKE ONE TABLET BY MOUTH TWICE DAILY) 30 tablet 6  . pravastatin (PRAVACHOL) 40 MG tablet Take 40 mg by mouth every evening.     . predniSONE (DELTASONE) 10 MG tablet Take 1 tablet (10 mg total) by mouth daily with breakfast. 11 tablet 0  . QUEtiapine (SEROQUEL) 25 MG tablet Take 25  mg by mouth 2 (two) times daily.     Marland Kitchen warfarin (COUMADIN) 5 MG tablet Take 2.5-5 mg by mouth daily at 6 PM. 2.5 mg on Mon,wed and Fri. 5 mg all other days    . lidocaine (LIDODERM) 5 % Place 1 patch onto the skin daily. Remove & Discard patch within 12 hours or as directed by MD (Patient not taking: Reported on 07/09/2016) 30 patch 0     Discharge Medications: Please see  discharge summary for a list of discharge medications.  Relevant Imaging Results:  Relevant Lab Results:   Additional Information SS# [id number]  Lew Dawes, LCSW

## 2016-07-09 NOTE — Clinical Social Work Placement (Signed)
   CLINICAL SOCIAL WORK PLACEMENT  NOTE  Date:  07/09/2016  Patient Details  Name: Amanda [name] MRN: [id number] Date of Birth: 11/03/1922  Clinical Social Work is seeking post-discharge placement for this patient at the Skilled  Nursing Facility level of care (*CSW will initial, date and re-position this form in  chart as items are completed):  Yes   Patient/family provided with [facility] Social Work Department's list of facilities offering this level of care within the geographic area requested by the patient (or if unable, by the patient's family).  Yes   Patient/family informed of their freedom to choose among providers that offer the needed level of care, that participate in Medicare, Medicaid or managed care program needed by the patient, have an available bed and are willing to accept the patient.  Yes   Patient/family informed of [facility]'s ownership interest in [facility] and [facility], as well as of the fact that they are under no obligation to receive care at these facilities.  PASRR submitted to EDS on 07/09/16     PASRR number received on 07/09/16     Existing PASRR number confirmed on 07/09/16     FL2 transmitted to all facilities in geographic area requested by pt/family on 07/09/16     FL2 transmitted to all facilities within larger geographic area on       Patient informed that his/her managed care company has contracts with or will negotiate with certain facilities, including the following:        Yes   Patient/family informed of bed offers received.  [facility]     Physician recommends and patient chooses bed at      [facility]  Patient to be transferred to facility by [facility]     Patient family notified on 07/09/16 of transfer.  Name of family member notified:  Patient, Patient's spouse, and Patient's son      PHYSICIAN Please prepare priority discharge summary,  including medications, Please prepare prescriptions, Please sign FL2     Additional Comment:    _______________________________________________ Lew DawesAshley N Henrietta Cieslewicz, LCSW 07/09/2016, 1:56 PM

## 2016-07-09 NOTE — Discharge Planning (Signed)
Amanda [name] J. Amanda RoersWood, RN, BSN, [employer] [phone] Spoke with pt and family at bedside regarding discharge planning for [facility]. Offered pt list of home health agencies to choose from.  Pt and son Amanda Fus(Tommy) want to explore possibility of SNF payment.  EDCM explained that pt does not have 3 midnight stay in last 30 days and would have to private pay.  Pt and son asked to speak to EDSW concerning possible SNF fees.  [facility].  Will continue to follow for disposition needs.

## 2016-07-09 NOTE — ED Provider Notes (Signed)
[facility]-EMER[facility]ENCY DEPT Provider Note   CSN: [id number] Arrival date & time: 07/09/16  0847     History   Chief Complaint Chief Complaint  Patient presents with  . Back Pain    HPI Amanda [name] is a 83 y.o. female with history of atrial fibrillation, chronic compression fractures who presents with back pain. Patient was seen on 07/06/2016 in emergency department and had an MRI completed. This showed chronic L2 and L4 compression fractures as well as severe spinal stenosis in multiple areas. Patient was discharged home with Vicodin and short course of prednisone. Patient and patient's son reports no significant change. Patient has had difficulty ambulating to the bathroom due to pain. Patient is able to ambulate. She denies any numbness or tingling, incontinence, saddle anesthesia. She denies any fevers, chest pain, shortness of breath, abdominal pain, nausea, vomiting. Patient has had some constipation. Family planned to initiate her stool softener today, however they have not done so yet. Patient was given fentanyl 500 g en route which resulted pain temporarily, but returned to 8/10.  HPI  Past Medical History:  Diagnosis Date  . Anxiety   . Arthritis   . Atrial fibrillation (HCC)   . Atrial fibrillation (HCC)   . Back pain   . Dysrhythmia   . GERD (gastroesophageal reflux disease)   . HLD (hyperlipidemia)   . Hypertension   . Hypothyroidism   . Insomnia   . Memory impairment   . Osteoarthritis    knee  . Osteoporosis   . PUD (peptic ulcer disease)    hx of upper GI bleeding   . Wears glasses     Patient Active Problem List   Diagnosis Date Noted  . Right inguinal hernia 05/14/2015  . Atrial fibrillation, persistent (HCC) 02/21/2015  . Femoral neck fracture (HCC) 04/23/2014  . Hip fracture due to osteoporosis (HCC) 04/23/2014  . Long term current use of anticoagulant therapy 10/07/2010  . Essential hypertension 07/21/2009  . CHEST PAIN 07/11/2009  . Hypothyroidism  07/03/2009  . Hyperlipidemia 07/03/2009  . Atrial fibrillation (HCC) 07/03/2009  . GERD 07/03/2009  . OSTEOARTHRITIS, KNEE 07/03/2009  . BACK PAIN 07/03/2009  . Osteoporosis 07/03/2009    Past Surgical History:  Procedure Laterality Date  . COLONOSCOPY    . ESOPHAGOGASTRODUODENOSCOPY    . HIP ARTHROPLASTY Right 04/25/2014   Procedure: ARTHROPLASTY BIPOLAR HIP;  Surgeon: Sheral Apleyimothy D Murphy, MD;  [facility];  Service: Orthopedics;  Laterality: Right;  . INGUINAL HERNIA REPAIR Right 05/14/2015   Procedure: RIGHT INGUINAL HERNIA REPAIR WITH MESH;  Surgeon: Chevis PrettyPaul Toth III, MD;  Location: [facility] OR;  Service: General;  Laterality: Right;  . INSERTION OF MESH Right 05/14/2015   Procedure: INSERTION OF MESH;  Surgeon: Chevis PrettyPaul Toth III, MD;  Location: [facility] OR;  Service: General;  Laterality: Right;  . tumor removed from right side of face      OB History    No data available       Home Medications    Prior to Admission medications   Medication Sig Start Date End Date Taking? Authorizing Provider  acetaminophen (TYLENOL) 325 MG tablet Take 650 mg by mouth every 6 (six) hours as needed for moderate pain.    Yes Historical Provider, MD  levothyroxine (SYNTHROID, LEVOTHROID) 75 MCG tablet Take 75 mcg by mouth daily.     Yes Historical Provider, MD  metoprolol tartrate (LOPRESSOR) 25 MG tablet TAKE ONE TABLET BY MOUTH TWICE DAILY-MUST BE SEEN Patient taking differently: TAKE ONE  TABLET BY MOUTH TWICE DAILY 06/14/13  Yes Lars Masson, MD  pravastatin (PRAVACHOL) 40 MG tablet Take 40 mg by mouth every evening.    Yes Historical Provider, MD  predniSONE (DELTASONE) 10 MG tablet Take 1 tablet (10 mg total) by mouth daily with breakfast. 07/06/16  Yes Donnetta Hutching, MD  QUEtiapine (SEROQUEL) 25 MG tablet Take 25 mg by mouth 2 (two) times daily.    Yes Historical Provider, MD  warfarin (COUMADIN) 5 MG tablet Take 2.5-5 mg by mouth daily at 6 PM. 2.5 mg on Mon,wed and Fri. 5 mg all other days   Yes Historical  Provider, MD  docusate sodium (COLACE) 250 MG capsule Take 1 capsule (250 mg total) by mouth daily. 07/09/16   Emi Holes, PA-C  oxyCODONE-acetaminophen (PERCOCET/ROXICET) 5-325 MG tablet Take 1-2 tablets by mouth every 6 (six) hours as needed for severe pain. 07/09/16   Emi Holes, PA-C    Family History Family History  Problem Relation Age of Onset  . Kidney cancer      family hx  . Coronary artery disease      family hx  . Arthritis      family hx    Social History Social History  Substance Use Topics  . Smoking status: Never Smoker  . Smokeless tobacco: Never Used     Comment: tobacco use - no  . Alcohol use No     Allergies   Alendronate; Aspirin; Aspirin-acetaminophen-caffeine; Nsaids; Sulfamethoxazole; Sulfonamide derivatives; Cephalexin; and Codeine   Review of Systems Review of Systems  Constitutional: Negative for chills and fever.  HENT: Negative for facial swelling and sore throat.   Respiratory: Negative for shortness of breath.   Cardiovascular: Negative for chest pain.  Gastrointestinal: Positive for constipation. Negative for abdominal pain, nausea and vomiting.  Genitourinary: Negative for dysuria.  Musculoskeletal: Positive for back pain.  Skin: Negative for rash and wound.  Neurological: Negative for headaches.  Psychiatric/Behavioral: The patient is not nervous/anxious.      Physical Exam Updated Vital Signs BP (!) 190/138 (BP Location: Right Arm)   Pulse 69   Temp 97.6 F (36.4 C) (Oral)   Resp 16   Ht 5\' 6"  (1.676 m)   Wt 56.7 kg   SpO2 98%   BMI 20.18 kg/m   Physical Exam  Constitutional: She appears well-developed and well-nourished. No distress.  HENT:  Head: Normocephalic and atraumatic.  Mouth/Throat: Oropharynx is clear and moist. No oropharyngeal exudate.  Eyes: Conjunctivae are normal. Pupils are equal, round, and reactive to light. Right eye exhibits no discharge. Left eye exhibits no discharge. No scleral icterus.    Neck: Normal range of motion. Neck supple. No thyromegaly present.  Cardiovascular: Normal rate, regular rhythm, normal heart sounds and intact distal pulses.  Exam reveals no gallop and no friction rub.   No murmur heard. Pulmonary/Chest: Effort normal and breath sounds normal. No stridor. No respiratory distress. She has no wheezes. She has no rales.  Abdominal: Soft. Bowel sounds are normal. She exhibits no distension. There is no tenderness. There is no rebound and no guarding.  Musculoskeletal: She exhibits no edema.       Lumbar back: She exhibits tenderness and bony tenderness.  5/5 strength to lower extremities, normal sensation  Lymphadenopathy:    She has no cervical adenopathy.  Neurological: She is alert. Coordination normal.  Skin: Skin is warm and dry. No rash noted. She is not diaphoretic. No pallor.  Psychiatric: She has a normal  mood and affect.  Nursing note and vitals reviewed.    ED Treatments / Results  Labs (all labs ordered are listed, but only abnormal results are displayed) Labs Reviewed - No data to display  EKG  EKG Interpretation None       Radiology No results found.  Procedures Procedures (including critical care time)  Medications Ordered in ED Medications  oxyCODONE-acetaminophen (PERCOCET/ROXICET) 5-325 MG per tablet 1 tablet (1 tablet Oral Given 07/09/16 0937)  docusate sodium (COLACE) capsule 100 mg (100 mg Oral Given 07/09/16 0937)  fentaNYL (SUBLIMAZE) injection 100 mcg (100 mcg Intravenous Given 07/09/16 1003)     Initial Impression / Assessment and Plan / ED Course  I have reviewed the triage vital signs and the nursing notes.  Pertinent labs & imaging results that were available during my care of the patient were reviewed by me and considered in my medical decision making (see chart for details).     Patient with chronic L2 and L4 compression fractures with difficult pain control at home. No change in symptoms. Neuro intact. No  saddle anesthesia or bowel or bladder incontinence. Patient fitted with lumbar corset ED. Family had concerns for taking care of the patient at home due to pain control and difficulty ambulating with pain. Social work and case management were consulted who arranged private pay for [facility]. Patient will be discharged home with Percocet and Colace in place of Norco. Patient family understand and agree with plan. Patient also evaluated by Dr. Jeraldine Loots who guided the patient's management and agrees with plan.  Final Clinical Impressions(s) / ED Diagnoses   Final diagnoses:  Closed compression fracture of L4 lumbar vertebra with delayed healing, subsequent encounter  Closed compression fracture of L2 lumbar vertebra with delayed healing, subsequent encounter    New Prescriptions New Prescriptions   DOCUSATE SODIUM (COLACE) 250 MG CAPSULE    Take 1 capsule (250 mg total) by mouth daily.   OXYCODONE-ACETAMINOPHEN (PERCOCET/ROXICET) 5-325 MG TABLET    Take 1-2 tablets by mouth every 6 (six) hours as needed for severe pain.     Emi Holes, PA-C 07/09/16 1453    Gerhard Munch, MD 07/13/16 1622

## 2016-07-09 NOTE — Progress Notes (Signed)
Patient's family has decided to move forward with Private Pay SNF Placement. Family requesting [facility] if bed available. CSW has made referral. PT evaluation pending.    Enos FlingAshley Angelin Cutrone, MSW, LCSW [facility] ED/85M Clinical Social Worker [phone]

## 2016-07-09 NOTE — ED Notes (Signed)
Placed pt on bedpan, tolerated well. 

## 2016-07-09 NOTE — ED Notes (Signed)
Social work at bedside.  

## 2016-07-09 NOTE — ED Notes (Signed)
Care management at bedside.

## 2016-07-09 NOTE — Progress Notes (Signed)
CSW engaged with Patient, Patient's son, and Patient's husband at Patient's bedside. CSW introduced self, role of CSW, and discussed discharge planning. Patient has Medicare which requires a 3-midnight/3-day qualifying inpatient stay for SNF placement. CSW also discussed that private pay SNF placement requires a 30 day payment at time of admission. Unfortunately, Patient does not meet criteria for an inpatient hospitalization at this time. CSW discussed the idea of paying privately for SNF placement. Patient's son reported that he would need to get his sister here to assist with the decision making. When CSW inquired about giving his sister a call, he reported that "she fell yesterday and hit her face- she is pretty bad off too. She is on medication and this is not a good time to call her". CSW explained the need to make referrals in a timely manner to facilitate discharge on today. Patient's son reports being overwhelmed and walked out of the room. He then returned and requested to speak to his father outside of the room. CSW agreed to give Patient's family time to discuss options. [facility] and family would like for Patient to return there if they decide to move forward with private pay SNF placement. CSW continues to follow.    Enos FlingAshley Lakyn Alsteen, MSW, LCSW [facility] ED/37[facility] Clinical Social Worker [phone]

## 2016-07-09 NOTE — ED Notes (Signed)
PT at bedside.

## 2016-07-09 NOTE — ED Triage Notes (Signed)
Pt. From home with son via [facility]. c/o 10/10 back pain. [facility] with same. Compression fracture noted at last visit. Pain is not being managed with Vicodin. Last vicodin was taken at 3am this morning. In route ems administered 100 mcg of fentanyl. Patient presents 8/10 pain on arrival to ED. Patient alert and oriented to place, person, and situation. Patient not oriented to time. Patient has hx if Afib.

## 2016-07-09 NOTE — Evaluation (Signed)
Physical Therapy Evaluation Patient Details Name: Amanda [name] MRN: [id number] DOB: 02/25/1923 Today's Date: 07/09/2016   History of Present Illness  83 yo returns to ED due to back pain and son unable to manage her at home. [facility] to pain and found to have L4 compression fx. PMHx: R THA, Afib, hypothyroidism  Clinical Impression  Pt pleasant in supine but reports pain limiting all mobility and demonstrates the same. Pt with decreased cognition and orientation but requires assist for rolling, sitting, standing and very distracted by pain in standing with ability to only walk a few feet. Pt with decreased strength, function, balance and mobility limited by pain who will benefit from acute therapy to maximize mobility. Pt and family educated for back precautions for decreased pain as well as brace wear and function.     Follow Up Recommendations SNF;Supervision/Assistance - 24 hour    Equipment Recommendations  Wheelchair (measurements PT);Wheelchair cushion (measurements PT)    Recommendations for Other Services       Precautions / Restrictions Precautions Precautions: Fall;Back Required Braces or Orthoses: Spinal Brace Spinal Brace: Applied in sitting position Other Brace/Splint: pt educated for brace wear and not to have on in supine      Mobility  Bed Mobility Overal bed mobility: Needs Assistance Bed Mobility: Rolling;Sidelying to Sit;Sit to Sidelying Rolling: Mod assist Sidelying to sit: Mod assist     Sit to sidelying: Mod assist General bed mobility comments: cues for sequence with physical assist to roll and rise from side as well as bring legs back onto surface  Transfers Overall transfer level: Needs assistance   Transfers: Sit to/from Stand Sit to Stand: Min assist         General transfer comment: assist with max encouragement and reassurance to rise  Ambulation/Gait Ambulation/Gait assistance: Mod assist Ambulation Distance (Feet):  10 Feet Assistive device: Rolling walker (2 wheeled) Gait Pattern/deviations: Shuffle;Trunk flexed   Gait velocity interpretation: Below normal speed for age/gender General Gait Details: pt with shuffling and limited gait with multimodal cues for RW use as pt with tendency to maintain trunk rotation throughout despite cues. pt with assist to direct and move RW. Pt reports intense pain and unable to tolerate further movement  Stairs            Wheelchair Mobility    Modified Rankin (Stroke Patients Only)       Balance Overall balance assessment: Needs assistance   Sitting balance-Leahy Scale: Poor       Standing balance-Leahy Scale: Poor                               Pertinent Vitals/Pain Pain Assessment: 0-10 Pain Score: 6  Pain Location: back pain with movement Pain Descriptors / Indicators: Grimacing;Guarding Pain Intervention(s): Limited activity within patient's tolerance;Repositioned    Home Living Family/patient expects to be discharged to:: Private residence Living Arrangements: Children;Spouse/significant other Available Help at Discharge: Family;Available 24 hours/day Type of Home: House Home Access: Stairs to enter Entrance Stairs-Rails: Right;Left;Can reach both Entrance Stairs-Number of Steps: 3 Home Layout: One level Home Equipment: Environmental consultantWalker - 2 wheels Additional Comments: lives with son and spouse    Prior Function Level of Independence: Needs assistance   Gait / Transfers Assistance Needed: pt was doing well and walking until back pain started. Pt now with assist for all mobility and not walking  ADL's / Homemaking Assistance Needed: max assist due  to pain        Hand Dominance        Extremity/Trunk Assessment   Upper Extremity Assessment Upper Extremity Assessment: Generalized weakness    Lower Extremity Assessment Lower Extremity Assessment: Generalized weakness    Cervical / Trunk Assessment Cervical / Trunk  Assessment: Kyphotic  Communication   Communication: HOH  Cognition Arousal/Alertness: Awake/alert Behavior During Therapy: Flat affect Overall Cognitive Status: Impaired/Different from baseline Area of Impairment: Memory;Safety/judgement;Following commands                     Memory: Decreased short-term memory;Decreased recall of precautions Following Commands: Follows one step commands inconsistently Safety/Judgement: Decreased awareness of safety;Decreased awareness of deficits     General Comments: pt unaware of date, decreased memory and unable to recall back precautions after education      General Comments      Exercises     Assessment/Plan    PT Assessment Patient needs continued PT services  PT Problem List Decreased strength;Decreased mobility;Decreased safety awareness;Decreased activity tolerance;Decreased cognition;Decreased balance;Pain;Decreased knowledge of use of DME;Decreased range of motion       PT Treatment Interventions Gait training;DME instruction;Therapeutic activities;Therapeutic exercise;Stair training;Functional mobility training;Patient/family education    PT Goals (Current goals can be found in the Care Plan section)  Acute Rehab PT Goals Patient Stated Goal: walk and go home PT Goal Formulation: With patient Time For Goal Achievement: 07/23/16 Potential to Achieve Goals: Fair    Frequency Min 2X/week   Barriers to discharge Inaccessible home environment;Decreased caregiver support      Co-evaluation               AM-PAC PT "6 Clicks" Daily Activity  Outcome Measure Difficulty turning over in bed (including adjusting bedclothes, sheets and blankets)?: Total Difficulty moving from lying on back to sitting on the side of the bed? : Total Difficulty sitting down on and standing up from a chair with arms (e.g., wheelchair, bedside commode, etc,.)?: Total Help needed moving to and from a bed to chair (including a  wheelchair)?: A Lot Help needed walking in hospital room?: A Lot Help needed climbing 3-5 steps with a railing? : A Lot 6 Click Score: 9    End of Session Equipment Utilized During Treatment: Gait belt;Back brace Activity Tolerance: Patient limited by pain Patient left: in bed;with call bell/phone within reach;with family/visitor present Nurse Communication: Mobility status;Precautions PT Visit Diagnosis: Unsteadiness on feet (R26.81);Muscle weakness (generalized) (M62.81);Difficulty in walking, not elsewhere classified (R26.2)    Time: 1610-9604 PT Time Calculation (min) (ACUTE ONLY): 13 min   Charges:   PT Evaluation $PT Eval Moderate Complexity: 1 Procedure     PT G Codes:   PT G-Codes **NOT FOR INPATIENT CLASS** Functional Assessment Tool Used: AM-PAC 6 Clicks Basic Mobility Functional Limitation: Mobility: Walking and moving around Mobility: Walking and Moving Around Current Status (V4098): At least 60 percent but less than 80 percent impaired, limited or restricted Mobility: Walking and Moving Around Goal Status [phone]): At least 20 percent but less than 40 percent impaired, limited or restricted    Amanda [name], PT [phone]   Amanda [name] 07/09/2016, 12:15 PM

## 2016-07-13 ENCOUNTER — Non-Acute Institutional Stay (SKILLED_NURSING_FACILITY): Payer: Medicare Other | Admitting: Internal Medicine

## 2016-07-13 ENCOUNTER — Encounter: Payer: Self-pay | Admitting: Internal Medicine

## 2016-07-13 DIAGNOSIS — R791 Abnormal coagulation profile: Secondary | ICD-10-CM | POA: Diagnosis not present

## 2016-07-13 DIAGNOSIS — S32000S Wedge compression fracture of unspecified lumbar vertebra, sequela: Secondary | ICD-10-CM | POA: Diagnosis not present

## 2016-07-13 DIAGNOSIS — I482 Chronic atrial fibrillation, unspecified: Secondary | ICD-10-CM

## 2016-07-13 DIAGNOSIS — E785 Hyperlipidemia, unspecified: Secondary | ICD-10-CM

## 2016-07-13 DIAGNOSIS — N289 Disorder of kidney and ureter, unspecified: Secondary | ICD-10-CM

## 2016-07-13 DIAGNOSIS — I10 Essential (primary) hypertension: Secondary | ICD-10-CM

## 2016-07-13 DIAGNOSIS — K5900 Constipation, unspecified: Secondary | ICD-10-CM

## 2016-07-13 DIAGNOSIS — M5441 Lumbago with sciatica, right side: Secondary | ICD-10-CM

## 2016-07-13 DIAGNOSIS — E039 Hypothyroidism, unspecified: Secondary | ICD-10-CM

## 2016-07-13 DIAGNOSIS — R2681 Unsteadiness on feet: Secondary | ICD-10-CM | POA: Diagnosis not present

## 2016-07-13 DIAGNOSIS — M5442 Lumbago with sciatica, left side: Secondary | ICD-10-CM

## 2016-07-13 NOTE — Progress Notes (Signed)
LOCATION: [facility]  PCP: Barbie Banner, MD   Code Status: Full Code  Goals of care: Advanced Directive information Advanced Directives 07/09/2016  Does Patient Have a Medical Advance Directive? No  Type of Advance Directive -  Copy of Healthcare Power of Attorney in Chart? -  Would patient like information on creating a medical advance directive? -       Extended Emergency Contact Information Primary Emergency Contact: Norby,Tommy Address: [address]          [place], [place] [address] [place] Home Phone: [phone] Relation: Son Secondary Emergency Contact: Doran [place], [place] [address] [place] Home Phone: [phone] Mobile Phone: [phone] Relation: Daughter   Allergies  Allergen Reactions  . Alendronate Other (See Comments)    GI Upset (intolerance) At risk for GI perforation  . Aspirin Diarrhea and Other (See Comments)    Melena, black tarry stools  . Aspirin-Acetaminophen-Caffeine Other (See Comments)    Melena, Dark stool  . Nsaids Other (See Comments)    Patient has ulcer   . Sulfamethoxazole Hives  . Sulfonamide Derivatives Hives  . Cephalexin Rash  . Codeine Nausea And Vomiting    GI upset    Chief Complaint  Patient presents with  . New Admit To SNF    New Admission Visit      HPI:  Patient is a 83 y.o. female seen today for short term rehabilitation post ED visit on 07/09/16 with acute back pain. She had ED visit on 07/06/16 and had MRI back for same complaint that showed chronic L2 and L4 compression fracture and severe spinal stenosis in multiple area. She was discharged home with vicodin and prednisone. She took these medications without significant relief and further limitation to her ambulation. Signs of cord compression were ruled out. ED recommended rehabilitation. She has PMH of afib, chronic compression fractures. She is seen in her room today with her husband and son at  bedside.   Review of Systems:  Constitutional: Negative for fever, chills.  HENT: Negative for headache, congestion, nasal discharge, sore throat, difficulty swallowing.   Eyes: Negative for eye pain, blurred vision, double vision and discharge.  Respiratory: Negative for cough, shortness of breath and wheezing.   Cardiovascular: Negative for chest pain, palpitations, leg swelling.  Gastrointestinal: Negative for heartburn, nausea, vomiting, abdominal pain, loss of appetite. Last bowel movement was yesterday.  Genitourinary: Negative for dysuria. Musculoskeletal: Negative for fall in the facility. Positive for lower back pain radiating to buttocks. Her pain is limiting her ambulation. She had a fall in facility.  Skin: Negative for itching, rash.  Neurological: Positive for occasional dizziness with change of position.  Psychiatric/Behavioral: Negative for depression.   Past Medical History:  Diagnosis Date  . Anxiety   . Arthritis   . Atrial fibrillation (HCC)   . Atrial fibrillation (HCC)   . Back pain   . Dysrhythmia   . GERD (gastroesophageal reflux disease)   . HLD (hyperlipidemia)   . Hypertension   . Hypothyroidism   . Insomnia   . Memory impairment   . Osteoarthritis    knee  . Osteoporosis   . PUD (peptic ulcer disease)    hx of upper GI bleeding   . Wears glasses    Past Surgical History:  Procedure Laterality Date  . COLONOSCOPY    . ESOPHAGOGASTRODUODENOSCOPY    . HIP ARTHROPLASTY Right 04/25/2014   Procedure:  ARTHROPLASTY BIPOLAR HIP;  Surgeon: Sheral Apleyimothy D Murphy, MD;  [facility];  Service: Orthopedics;  Laterality: Right;  . INGUINAL HERNIA REPAIR Right 05/14/2015   Procedure: RIGHT INGUINAL HERNIA REPAIR WITH MESH;  Surgeon: Chevis PrettyPaul Toth III, MD;  Location: [facility] OR;  Service: General;  Laterality: Right;  . INSERTION OF MESH Right 05/14/2015   Procedure: INSERTION OF MESH;  Surgeon: Chevis PrettyPaul Toth III, MD;  Location: [facility] OR;  Service: General;  Laterality: Right;  .  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 She has never used smokeless tobacco. She reports that she does not drink alcohol or use drugs.  Family History  Problem Relation Age of Onset  . Kidney cancer      family hx  . Coronary artery disease      family hx  . Arthritis      family hx    Medications: Allergies as of 07/13/2016      Reactions   Alendronate Other (See Comments)   GI Upset (intolerance) At risk for GI perforation   Aspirin Diarrhea, Other (See Comments)   Melena, black tarry stools   Aspirin-acetaminophen-caffeine Other (See Comments)   Melena, Dark stool   Nsaids Other (See Comments)   Patient has ulcer    Sulfamethoxazole Hives   Sulfonamide Derivatives Hives   Cephalexin Rash   Codeine Nausea And Vomiting   GI upset      Medication List       Accurate as of 07/13/16 11:32 AM. Always use your most recent med list.          acetaminophen 325 MG tablet Commonly known as:  TYLENOL Take 650 mg by mouth every 6 (six) hours as needed for moderate pain.   docusate sodium 250 MG capsule Commonly known as:  COLACE Take 1 capsule (250 mg total) by mouth daily.   levothyroxine 75 MCG tablet Commonly known as:  SYNTHROID, LEVOTHROID Take 75 mcg by mouth daily.   metoprolol tartrate 25 MG tablet Commonly known as:  LOPRESSOR TAKE ONE TABLET BY MOUTH TWICE DAILY-MUST BE SEEN   oxyCODONE-acetaminophen 5-325 MG tablet Commonly known as:  PERCOCET/ROXICET Take 1-2 tablets by mouth every 6 (six) hours as needed for severe pain.   pravastatin 40 MG tablet Commonly known as:  PRAVACHOL Take 40 mg by mouth every evening.   predniSONE 10 MG tablet Commonly known as:  DELTASONE Take 1 tablet (10 mg total) by mouth daily with breakfast.   QUEtiapine 25 MG tablet Commonly known as:  SEROQUEL Take 25 mg by mouth 2 (two) times daily.       Immunizations:  There is no immunization history on file for  this patient.   Physical Exam: Vitals:   07/13/16 0933  BP: 107/78  Pulse: 88  Resp: 18  Temp: 98.9 F (37.2 C)  TempSrc: Oral  SpO2: 96%  Weight: 118 lb (53.5 kg)  Height: 5\' 6"  (1.676 m)   Body mass index is 19.05 kg/m.  General- elderly female, frail and thin built, in no acute distress Head- normocephalic, atraumatic Nose-  no nasal discharge Throat- moist mucus membrane, missing dentition Eyes- PERRLA, EOMI, no pallor, no icterus, no discharge, normal conjunctiva, normal sclera Neck- no cervical lymphadenopathy Cardiovascular- irregular heart rate, no murmur Respiratory- bilateral clear to auscultation, no wheeze, no rhonchi, no crackles, no use of accessory muscles Abdomen- bowel sounds present, soft, non tender, no guarding or rigidity,  no CVA tenderness Musculoskeletal- able to move all 4 extremities, arthritis changes to her fingers, no leg edema, lumbar spinal tenderness Neurological- alert and oriented to self, place and time but not to person Skin- warm and dry Psychiatry- normal mood and affect    Labs reviewed: None available for review   Radiological Exams: Dg Lumbar Spine Complete  Result Date: 07/06/2016 CLINICAL DATA:  Low back pain without known injury. EXAM: LUMBAR SPINE - COMPLETE 4+ VIEW COMPARISON:  None. FINDINGS: Mild grade 1 retrolisthesis of L2-3 is noted secondary to degenerative disc disease at this level. Severe degenerative disc disease is noted at T12-L1, L1-2 and L2-3. Mild degenerative disc disease is noted at L4-5. Moderate compression deformity L4 vertebral body is noted consistent with fracture of indeterminate age. IMPRESSION: Severe multilevel degenerative disc disease. Moderate compression deformity L4 vertebral body is noted which may represent old fracture, but if patient is symptomatic in this area, MRI is recommended to evaluate for possible acute fracture. Electronically Signed   By: Lupita Raider, M.D.   On: 07/06/2016 09:41    Mr Lumbar Spine Wo Contrast  Result Date: 07/06/2016 CLINICAL DATA:  83 year old female with lumbar back pain. Age indeterminate L4 compression on lumbar radiographs today. EXAM: MRI LUMBAR SPINE WITHOUT CONTRAST TECHNIQUE: Multiplanar, multisequence MR imaging of the lumbar spine was performed. No intravenous contrast was administered. COMPARISON:  Lumbar radiographs 0921 hours today. FINDINGS: Segmentation: Normal is demonstrated on the comparison radiographs today. Alignment: Subtle grade 1 anterolisthesis at L5-S1. Straightening of lumbar lordosis. Mild retrolisthesis of L2 on L3, and L1 on L2. Vertebrae: Moderate L4 compression fracture is chronic with no marrow edema. Mild L2 inferior endplate compression fracture is chronic. Visualized bone marrow signal is within normal limits. No marrow edema or evidence of acute osseous abnormality. Visible sacrum and SI joints are intact. Conus medullaris: Extends to the T12 level and appears normal. Paraspinal and other soft tissues: Negative for age visible abdominal viscera. Distal colon diverticulosis with no active inflammation identified. Negative for age posterior paraspinal soft tissues. Disc levels: T11-T12:  Mild disc bulge. T12-L1: Disc space loss with vacuum disc and circumferential disc osteophyte complex. No stenosis. L1-L2: Disc desiccation with right eccentric circumferential disc osteophyte complex. No stenosis. L2-L3: Circumferential disc bulge eccentric to the right with broad-based posterior component and widespread endplate spurring. Mild facet and moderate ligament flavum hypertrophy. Borderline to mild spinal and bilateral L2 foraminal stenosis. L3-L4: Disc space loss with vacuum disc. Left eccentric circumferential disc bulge with broad-based posterior component. Mild endplate spurring. Moderate facet and ligament flavum hypertrophy. Moderate spinal stenosis (series 6, image 16). Mild left greater than right L3 foraminal stenosis. L4-L5: Disc  space loss with left eccentric circumferential disc bulge and endplate spurring. Broad-based posterior and left far lateral components. Moderate to severe facet and ligament flavum hypertrophy. Severe spinal stenosis (series 6, image 21). Moderate to severe left and mild to moderate right L4 foraminal stenosis. L5-S1: Circumferential disc bulge with broad-based posterior component. Severe facet hypertrophy. Right facet joint fluid. Moderate to severe right greater than left lateral recess stenosis without significant spinal stenosis. Moderate to severe left and mild to moderate right L5 foraminal stenosis. IMPRESSION: 1. No acute osseous abnormality identified. Chronic L2 and L4 compression fractures. 2. Multifactorial severe spinal stenosis at L4-L5, and moderate to severe lateral recess stenosis at L5-S1. Moderate to severe neural foraminal stenosis at both levels, worse on the left. 3. Moderate spinal stenosis at L3-L4. Mild spinal stenosis at L2-L3. Electronically  Signed   By: Odessa Fleming M.D.   On: 07/06/2016 13:15    Assessment/Plan  Unsteady gait With back pain. Will have patient work with PT/OT as tolerated to regain strength and restore function.  Fall precautions are in place.  Acute back pain With lumbar compression fracture. Has radiculopathy. Currently on prednisone, continue this for total of 2 weeks. Continue pain medication as below. Will have her work with physical therapy and occupational therapy team to help with gait training and muscle strengthening exercises.fall precautions. Skin care. Encourage to be out of bed. PMR consult for pain management.   Lumbar compression fracture With back pain. No signs of cord compression at present. PMR consult for pain management. Continue percocet 5-325 mg 1-2 tablet q6h prn pain.   Impaired renal function Noted in lab work in her chart. Check bmp  afib Controlled HR, continue metoprolol tartrate. Coumadin on hold with supratherapeutic  inr.  supratherapeutic INR INR today 4.6 , no bleed reported. Hold coumadin tonight and check inr in am. Goal inr 2-3  HTN Continue metoprolol tartrate 25 mg bid  Hypothyroidism Continue levothyroxine, monitor  Constipation Continue docusate 250 mg daily and monitor  HLD Continue pravastatin and monitor    Goals of care: short term rehabilitation   Labs/tests ordered: cbc, cmp 07/14/16  Family/ staff Communication: reviewed care plan with patient and nursing supervisor    Oneal Grout, MD Internal Medicine [facility] [address] [place], [place] [address] Cell Phone (Monday-Friday 8 am - 5 pm): [phone] On Call: [phone] and follow prompts after 5 pm and on weekends Office Phone: [phone] Office Fax: [phone]

## 2016-07-14 LAB — BASIC METABOLIC PANEL
BUN: 38 mg/dL — AB (ref 4–21)
Creatinine: 1.2 mg/dL — AB (ref 0.5–1.1)
GLUCOSE: 91 mg/dL
POTASSIUM: 4.4 mmol/L (ref 3.4–5.3)
SODIUM: 139 mmol/L (ref 137–147)

## 2016-07-14 LAB — CBC AND DIFFERENTIAL
HCT: 45 % (ref 36–46)
Hemoglobin: 14.4 g/dL (ref 12.0–16.0)
Platelets: 245 10*3/uL (ref 150–399)
WBC: 11.2 10^3/mL

## 2016-07-14 LAB — HEPATIC FUNCTION PANEL
ALT: 11 U/L (ref 7–35)
AST: 26 U/L (ref 13–35)
Alkaline Phosphatase: 80 U/L (ref 25–125)
BILIRUBIN, TOTAL: 0.8 mg/dL

## 2016-07-19 ENCOUNTER — Encounter: Payer: Self-pay | Admitting: Adult Health

## 2016-07-19 ENCOUNTER — Non-Acute Institutional Stay (SKILLED_NURSING_FACILITY): Payer: Medicare Other | Admitting: Adult Health

## 2016-07-19 DIAGNOSIS — R2681 Unsteadiness on feet: Secondary | ICD-10-CM | POA: Diagnosis not present

## 2016-07-19 DIAGNOSIS — I482 Chronic atrial fibrillation, unspecified: Secondary | ICD-10-CM

## 2016-07-19 DIAGNOSIS — K5900 Constipation, unspecified: Secondary | ICD-10-CM | POA: Diagnosis not present

## 2016-07-19 DIAGNOSIS — S32000S Wedge compression fracture of unspecified lumbar vertebra, sequela: Secondary | ICD-10-CM

## 2016-07-19 DIAGNOSIS — E785 Hyperlipidemia, unspecified: Secondary | ICD-10-CM | POA: Diagnosis not present

## 2016-07-19 DIAGNOSIS — N289 Disorder of kidney and ureter, unspecified: Secondary | ICD-10-CM

## 2016-07-19 DIAGNOSIS — I10 Essential (primary) hypertension: Secondary | ICD-10-CM

## 2016-07-19 DIAGNOSIS — Z7901 Long term (current) use of anticoagulants: Secondary | ICD-10-CM

## 2016-07-19 DIAGNOSIS — E039 Hypothyroidism, unspecified: Secondary | ICD-10-CM

## 2016-07-19 NOTE — Progress Notes (Signed)
DATE:  07/19/2016   MRN:  [id number]  BIRTHDAY: March 15, 1922  Facility:  Nursing Home Location:  [facility]  Nursing Home Room Number: [id number]  LEVEL OF CARE:  SNF [phone])  Contact Information    Name Relation Home Work [place] Son [phone]     [facility] Daughter [phone]  [phone]   Sharonna, Vinje [phone]         Code Status History    Date Active Date Inactive Code Status Order ID Comments User Context   04/25/2014  4:58 PM 04/29/2014  6:02 PM Full Code [id number]  Ishmael Holter Inpatient   04/23/2014 11:19 AM 04/25/2014  4:58 PM Full Code [id number]  Calvert Cantor, MD ED       Chief Complaint  Patient presents with  . Discharge Note    HISTORY OF PRESENT ILLNESS:  This is a 83-YO female seen for a discharge visit.  She will discharge home per family request on 07/19/16, to be followed by [facility] for PT, OT, and CNA services.  She has been admitted to [facility] [place] [facility] on 11/13/16 post ED visit on 07/09/16 with acute back pain. She had ED visit for back pain  on 07/06/16 and had MRI back which showed chronic L2 and L4 compression fracture and severe spinal stenosis in multiple areas. She was discharged with Vicodin and prednisone without significant relief and further limitation to her ambulation and rehabilitation was recommended. She has PMH of atrial fibrillation and chronic compression fractures.  She was seen in the room with husband and son @ bedside.   PAST MEDICAL HISTORY:  Past Medical History:  Diagnosis Date  . Anxiety   . Arthritis   . Atrial fibrillation (HCC)   . Atrial fibrillation (HCC)   . Back pain   . Dysrhythmia   . GERD (gastroesophageal reflux disease)   . HLD (hyperlipidemia)   . Hypertension   . Hypothyroidism   . Insomnia   . Memory impairment   . Osteoarthritis    knee  . Osteoporosis   . PUD (peptic ulcer disease)    hx of upper GI bleeding   . Wears glasses        CURRENT MEDICATIONS: Reviewed  Patient's Medications  New Prescriptions   No medications on file  Previous Medications   ACETAMINOPHEN (TYLENOL) 325 MG TABLET    Take 650 mg by mouth every 6 (six) hours as needed for moderate pain.    DOCUSATE SODIUM (COLACE) 250 MG CAPSULE    Take 1 capsule (250 mg total) by mouth daily.   LEVOTHYROXINE (SYNTHROID, LEVOTHROID) 75 MCG TABLET    Take 75 mcg by mouth daily.     METOPROLOL TARTRATE (LOPRESSOR) 25 MG TABLET    TAKE ONE TABLET BY MOUTH TWICE DAILY-MUST BE SEEN   NUTRITIONAL SUPPLEMENT LIQD    Take 120 mLs by mouth 2 (two) times daily.   OXYCODONE-ACETAMINOPHEN (PERCOCET/ROXICET) 5-325 MG TABLET    Take 1-2 tablets by mouth every 6 (six) hours as needed for severe pain.   PRAVASTATIN (PRAVACHOL) 40 MG TABLET    Take 40 mg by mouth every evening.    QUETIAPINE (SEROQUEL) 25 MG TABLET    Take 25 mg by mouth 2 (two) times daily.    WARFARIN (COUMADIN) 3 MG TABLET    Take 1.5 mg by mouth daily. Take 1/2 tablet to = 1.5 mg  Modified Medications   No medications on file  Discontinued Medications  PREDNISONE (DELTASONE) 10 MG TABLET    Take 1 tablet (10 mg total) by mouth daily with breakfast.     Allergies  Allergen Reactions  . Alendronate Other (See Comments)    GI Upset (intolerance) At risk for GI perforation  . Aspirin Diarrhea and Other (See Comments)    Melena, black tarry stools  . Aspirin-Acetaminophen-Caffeine Other (See Comments)    Melena, Dark stool  . Nsaids Other (See Comments)    Patient has ulcer   . Sulfamethoxazole Hives  . Sulfonamide Derivatives Hives  . Cephalexin Rash  . Codeine Nausea And Vomiting    GI upset     REVIEW OF SYSTEMS:  GENERAL: no change in appetite, no fatigue, no weight changes, no fever, chills or weakness EYES: Denies change in vision, dry eyes, eye pain, itching or discharge EARS: Denies change in hearing, ringing in ears, or earache NOSE: Denies nasal congestion or epistaxis MOUTH  and THROAT: Denies oral discomfort, gingival pain or bleeding, pain from teeth or hoarseness   RESPIRATORY: no cough, SOB, DOE, wheezing, hemoptysis CARDIAC: no chest pain, edema or palpitations GI: no abdominal pain, diarrhea, constipation, heart burn, nausea or vomiting GU: Denies dysuria, frequency, hematuria, incontinence, or discharge PSYCHIATRIC: Denies feeling of depression or anxiety. No report of hallucinations, insomnia, paranoia, or agitation   PHYSICAL EXAMINATION  GENERAL APPEARANCE: Well nourished. In no acute distress. Normal body habitus SKIN:  Skin is warm and dry.  HEAD: Normal in size and contour. No evidence of trauma EYES: Lids open and close normally. No blepharitis, entropion or ectropion. PERRL. Conjunctivae are clear and sclerae are white. Lenses are without opacity EARS: Pinnae are normal. Patient hears normal voice tunes of the examiner MOUTH and THROAT: Lips are without lesions. Oral mucosa is moist and without lesions. Tongue is normal in shape, size, and color and without lesions NECK: supple, trachea midline, no neck masses, no thyroid tenderness, no thyromegaly LYMPHATICS: no LAN in the neck, no supraclavicular LAN RESPIRATORY: breathing is even & unlabored, BS CTAB CARDIAC: Irregular heart rate, no murmur,no extra heart sounds, no edema GI: abdomen soft, normal BS, no masses, no tenderness, no hepatomegaly, no splenomegaly EXTREMITIES:  Able to move X 4 extremities PSYCHIATRIC: Alert to place, disoriented to time and place. Affect and behavior are appropriate   LABS/RADIOLOGY: Labs reviewed: 07/14/16 sodium 139 K4.4  Glucose 91 BUN 38 creatinine 1.23 calcium 9.4 total protein 6.9 albumin 3.68 globulin 3.2 total bilirubin 0.79 alkaline phosphatase 80 SGOT 26 SGPT 11 GFR 43.43    Dg Lumbar Spine Complete  Result Date: 07/06/2016 CLINICAL DATA:  Low back pain without known injury. EXAM: LUMBAR SPINE - COMPLETE 4+ VIEW COMPARISON:  None. FINDINGS: Mild grade  1 retrolisthesis of L2-3 is noted secondary to degenerative disc disease at this level. Severe degenerative disc disease is noted at T12-L1, L1-2 and L2-3. Mild degenerative disc disease is noted at L4-5. Moderate compression deformity L4 vertebral body is noted consistent with fracture of indeterminate age. IMPRESSION: Severe multilevel degenerative disc disease. Moderate compression deformity L4 vertebral body is noted which may represent old fracture, but if patient is symptomatic in this area, MRI is recommended to evaluate for possible acute fracture. Electronically Signed   By: Lupita Raider, M.D.   On: 07/06/2016 09:41   Mr Lumbar Spine Wo Contrast  Result Date: 07/06/2016 CLINICAL DATA:  83 year old female with lumbar back pain. Age indeterminate L4 compression on lumbar radiographs today. EXAM: MRI LUMBAR SPINE WITHOUT CONTRAST TECHNIQUE: Multiplanar, multisequence  MR imaging of the lumbar spine was performed. No intravenous contrast was administered. COMPARISON:  Lumbar radiographs 0921 hours today. FINDINGS: Segmentation: Normal is demonstrated on the comparison radiographs today. Alignment: Subtle grade 1 anterolisthesis at L5-S1. Straightening of lumbar lordosis. Mild retrolisthesis of L2 on L3, and L1 on L2. Vertebrae: Moderate L4 compression fracture is chronic with no marrow edema. Mild L2 inferior endplate compression fracture is chronic. Visualized bone marrow signal is within normal limits. No marrow edema or evidence of acute osseous abnormality. Visible sacrum and SI joints are intact. Conus medullaris: Extends to the T12 level and appears normal. Paraspinal and other soft tissues: Negative for age visible abdominal viscera. Distal colon diverticulosis with no active inflammation identified. Negative for age posterior paraspinal soft tissues. Disc levels: T11-T12:  Mild disc bulge. T12-L1: Disc space loss with vacuum disc and circumferential disc osteophyte complex. No stenosis. L1-L2: Disc  desiccation with right eccentric circumferential disc osteophyte complex. No stenosis. L2-L3: Circumferential disc bulge eccentric to the right with broad-based posterior component and widespread endplate spurring. Mild facet and moderate ligament flavum hypertrophy. Borderline to mild spinal and bilateral L2 foraminal stenosis. L3-L4: Disc space loss with vacuum disc. Left eccentric circumferential disc bulge with broad-based posterior component. Mild endplate spurring. Moderate facet and ligament flavum hypertrophy. Moderate spinal stenosis (series 6, image 16). Mild left greater than right L3 foraminal stenosis. L4-L5: Disc space loss with left eccentric circumferential disc bulge and endplate spurring. Broad-based posterior and left far lateral components. Moderate to severe facet and ligament flavum hypertrophy. Severe spinal stenosis (series 6, image 21). Moderate to severe left and mild to moderate right L4 foraminal stenosis. L5-S1: Circumferential disc bulge with broad-based posterior component. Severe facet hypertrophy. Right facet joint fluid. Moderate to severe right greater than left lateral recess stenosis without significant spinal stenosis. Moderate to severe left and mild to moderate right L5 foraminal stenosis. IMPRESSION: 1. No acute osseous abnormality identified. Chronic L2 and L4 compression fractures. 2. Multifactorial severe spinal stenosis at L4-L5, and moderate to severe lateral recess stenosis at L5-S1. Moderate to severe neural foraminal stenosis at both levels, worse on the left. 3. Moderate spinal stenosis at L3-L4. Mild spinal stenosis at L2-L3. Electronically Signed   By: Odessa Fleming M.D.   On: 07/06/2016 13:15    ASSESSMENT/PLAN:  Unsteady gait - for home health PT, OT and CNA, for therapeutic is strengthening exercises; fall precautions  Lumbar compression fracture - for home health PT, OT and CNA, for therapeutic is strengthening exercises; Prednisone 10 mg daily till today,  07/19/16, Percocet 5/325 mg 1-2 tabs by mouth every 6 hours when necessary for pain; follow-up with Dr. Benedetto Goad, PCP  Atrial fibrillation - rate controlled; continue metoprolol tartrate 25 mg 1 tab by mouth twice a day and Coumadin  Long-term use of anticoagulant - INR 1.4, subtherapeutic; increase Coumadin from 1.5 mg to 2.5 mg 1 tab by mouth daily, check INR on 07/22/16  Chronic kidney disease, stage III -  stable Lab Results  Component Value Date   CREATININE 1.2 (A) 07/14/2016   Hypothyroidism - continue Synthroid 75 g 1 tab by mouth daily  Constipation - continue docusate sodium 250 mg 1 capsule by mouth daily  Hyperlipidemia - continue pravastatin 40 mg 1 tab by mouth daily,  Essential hypertension  - well-controlled; continue metoprolol tartrate 25 mg 1 tab by mouth twice a day      I have filled out patient's discharge paperwork and written prescriptions.  Patient will receive  home health PT, OT and CNA.  DME provided:  None  Total discharge time: Less than 30 minutes  Discharge time involved coordination of the discharge process with social worker, nursing staff and therapy department. Medical justification for home health services verified.   Monina C. Medina-Vargas - NP    [facility] [phone]

## 2016-09-09 ENCOUNTER — Emergency Department (HOSPITAL_COMMUNITY): Payer: Medicare Other

## 2016-09-09 ENCOUNTER — Other Ambulatory Visit: Payer: Self-pay

## 2016-09-09 ENCOUNTER — Encounter (HOSPITAL_COMMUNITY): Payer: Self-pay | Admitting: Family Medicine

## 2016-09-09 ENCOUNTER — Inpatient Hospital Stay (HOSPITAL_COMMUNITY)
Admission: EM | Admit: 2016-09-09 | Discharge: 2016-09-15 | DRG: 543 | Disposition: A | Discharge status: Skilled Nursing Facility | Payer: Medicare Other | Attending: Internal Medicine | Admitting: Internal Medicine

## 2016-09-09 DIAGNOSIS — I10 Essential (primary) hypertension: Secondary | ICD-10-CM

## 2016-09-09 DIAGNOSIS — Z886 Allergy status to analgesic agent status: Secondary | ICD-10-CM

## 2016-09-09 DIAGNOSIS — L8990 Pressure ulcer of unspecified site, unspecified stage: Secondary | ICD-10-CM | POA: Diagnosis present

## 2016-09-09 DIAGNOSIS — Z96643 Presence of artificial hip joint, bilateral: Secondary | ICD-10-CM | POA: Diagnosis present

## 2016-09-09 DIAGNOSIS — M1710 Unilateral primary osteoarthritis, unspecified knee: Secondary | ICD-10-CM | POA: Diagnosis present

## 2016-09-09 DIAGNOSIS — I4891 Unspecified atrial fibrillation: Secondary | ICD-10-CM | POA: Diagnosis present

## 2016-09-09 DIAGNOSIS — S22000A Wedge compression fracture of unspecified thoracic vertebra, initial encounter for closed fracture: Secondary | ICD-10-CM | POA: Diagnosis present

## 2016-09-09 DIAGNOSIS — Z885 Allergy status to narcotic agent status: Secondary | ICD-10-CM

## 2016-09-09 DIAGNOSIS — M4854XA Collapsed vertebra, not elsewhere classified, thoracic region, initial encounter for fracture: Principal | ICD-10-CM | POA: Diagnosis present

## 2016-09-09 DIAGNOSIS — K5792 Diverticulitis of intestine, part unspecified, without perforation or abscess without bleeding: Secondary | ICD-10-CM | POA: Diagnosis not present

## 2016-09-09 DIAGNOSIS — F0391 Unspecified dementia with behavioral disturbance: Secondary | ICD-10-CM | POA: Diagnosis present

## 2016-09-09 DIAGNOSIS — Z888 Allergy status to other drugs, medicaments and biological substances status: Secondary | ICD-10-CM

## 2016-09-09 DIAGNOSIS — K529 Noninfective gastroenteritis and colitis, unspecified: Secondary | ICD-10-CM

## 2016-09-09 DIAGNOSIS — I480 Paroxysmal atrial fibrillation: Secondary | ICD-10-CM | POA: Diagnosis present

## 2016-09-09 DIAGNOSIS — L89210 Pressure ulcer of right hip, unstageable: Secondary | ICD-10-CM | POA: Diagnosis present

## 2016-09-09 DIAGNOSIS — I482 Chronic atrial fibrillation: Secondary | ICD-10-CM | POA: Diagnosis present

## 2016-09-09 DIAGNOSIS — Z7901 Long term (current) use of anticoagulants: Secondary | ICD-10-CM

## 2016-09-09 DIAGNOSIS — R531 Weakness: Secondary | ICD-10-CM | POA: Diagnosis present

## 2016-09-09 DIAGNOSIS — M549 Dorsalgia, unspecified: Secondary | ICD-10-CM | POA: Diagnosis not present

## 2016-09-09 DIAGNOSIS — E785 Hyperlipidemia, unspecified: Secondary | ICD-10-CM | POA: Diagnosis present

## 2016-09-09 DIAGNOSIS — I4819 Other persistent atrial fibrillation: Secondary | ICD-10-CM | POA: Diagnosis present

## 2016-09-09 DIAGNOSIS — R109 Unspecified abdominal pain: Secondary | ICD-10-CM

## 2016-09-09 DIAGNOSIS — I119 Hypertensive heart disease without heart failure: Secondary | ICD-10-CM | POA: Diagnosis present

## 2016-09-09 DIAGNOSIS — E039 Hypothyroidism, unspecified: Secondary | ICD-10-CM | POA: Diagnosis present

## 2016-09-09 DIAGNOSIS — S71001D Unspecified open wound, right hip, subsequent encounter: Secondary | ICD-10-CM

## 2016-09-09 DIAGNOSIS — I481 Persistent atrial fibrillation: Secondary | ICD-10-CM | POA: Diagnosis present

## 2016-09-09 DIAGNOSIS — Z881 Allergy status to other antibiotic agents status: Secondary | ICD-10-CM

## 2016-09-09 DIAGNOSIS — Z882 Allergy status to sulfonamides status: Secondary | ICD-10-CM

## 2016-09-09 DIAGNOSIS — R52 Pain, unspecified: Secondary | ICD-10-CM

## 2016-09-09 DIAGNOSIS — M810 Age-related osteoporosis without current pathological fracture: Secondary | ICD-10-CM | POA: Diagnosis present

## 2016-09-09 DIAGNOSIS — Z87310 Personal history of (healed) osteoporosis fracture: Secondary | ICD-10-CM

## 2016-09-09 DIAGNOSIS — K6389 Other specified diseases of intestine: Secondary | ICD-10-CM | POA: Diagnosis present

## 2016-09-09 LAB — URINALYSIS, ROUTINE W REFLEX MICROSCOPIC
BACTERIA UA: NONE SEEN
BILIRUBIN URINE: NEGATIVE
Glucose, UA: NEGATIVE mg/dL
Hgb urine dipstick: NEGATIVE
KETONES UR: NEGATIVE mg/dL
NITRITE: NEGATIVE
PROTEIN: 100 mg/dL — AB
Specific Gravity, Urine: 1.02 (ref 1.005–1.030)
pH: 6 (ref 5.0–8.0)

## 2016-09-09 LAB — COMPREHENSIVE METABOLIC PANEL
ALBUMIN: 3.6 g/dL (ref 3.5–5.0)
ALT: 20 U/L (ref 14–54)
ANION GAP: 9 (ref 5–15)
AST: 40 U/L (ref 15–41)
Alkaline Phosphatase: 78 U/L (ref 38–126)
BUN: 17 mg/dL (ref 6–20)
CHLORIDE: 102 mmol/L (ref 101–111)
CO2: 28 mmol/L (ref 22–32)
Calcium: 9.4 mg/dL (ref 8.9–10.3)
Creatinine, Ser: 1.02 mg/dL — ABNORMAL HIGH (ref 0.44–1.00)
GFR calc Af Amer: 54 mL/min — ABNORMAL LOW (ref >60)
GFR calc non Af Amer: 47 mL/min — ABNORMAL LOW (ref >60)
GLUCOSE: 118 mg/dL — AB (ref 65–99)
POTASSIUM: 3.7 mmol/L (ref 3.5–5.1)
Sodium: 139 mmol/L (ref 135–145)
TOTAL PROTEIN: 7.3 g/dL (ref 6.5–8.1)
Total Bilirubin: 0.9 mg/dL (ref 0.3–1.2)

## 2016-09-09 LAB — CBC
HEMATOCRIT: 41.3 % (ref 36.0–46.0)
Hemoglobin: 13.6 g/dL (ref 12.0–15.0)
MCH: 31.3 pg (ref 26.0–34.0)
MCHC: 32.9 g/dL (ref 30.0–36.0)
MCV: 94.9 fL (ref 78.0–100.0)
PLATELETS: 276 10*3/uL (ref 150–400)
RBC: 4.35 MIL/uL (ref 3.87–5.11)
RDW: 15.4 % (ref 11.5–15.5)
WBC: 8.5 10*3/uL (ref 4.0–10.5)

## 2016-09-09 LAB — BRAIN NATRIURETIC PEPTIDE: B Natriuretic Peptide: 153.9 pg/mL — ABNORMAL HIGH (ref 0.0–100.0)

## 2016-09-09 LAB — PROTIME-INR
INR: 2.15
Prothrombin Time: 24.4 seconds — ABNORMAL HIGH (ref 11.4–15.2)

## 2016-09-09 LAB — LIPASE, BLOOD: Lipase: 18 U/L (ref 11–51)

## 2016-09-09 LAB — OCCULT BLOOD, POC DEVICE: Fecal Occult Bld: NEGATIVE

## 2016-09-09 LAB — POCT I-STAT TROPONIN I: TROPONIN I, POC: 0.03 ng/mL (ref 0.00–0.08)

## 2016-09-09 MED ORDER — CIPROFLOXACIN IN D5W 400 MG/200ML IV SOLN
400.0000 mg | Freq: Two times a day (BID) | INTRAVENOUS | Status: DC
  Administered 2016-09-10 – 2016-09-12 (×6): 400 mg via INTRAVENOUS
  Filled 2016-09-09 (×6): qty 200

## 2016-09-09 MED ORDER — IOPAMIDOL (ISOVUE-300) INJECTION 61%
INTRAVENOUS | Status: AC
  Administered 2016-09-09: 75 mL via INTRAVENOUS
  Filled 2016-09-09: qty 75

## 2016-09-09 MED ORDER — AMOXICILLIN-POT CLAVULANATE 875-125 MG PO TABS: 1.0000 | ORAL_TABLET | Freq: Two times a day (BID) | ORAL | 0 refills | Status: DC

## 2016-09-09 MED ORDER — ACETAMINOPHEN 325 MG PO TABS
650.0000 mg | ORAL_TABLET | Freq: Four times a day (QID) | ORAL | Status: DC | PRN
  Administered 2016-09-10 – 2016-09-12 (×3): 650 mg via ORAL
  Filled 2016-09-09 (×3): qty 2

## 2016-09-09 MED ORDER — ACETAMINOPHEN 325 MG PO TABS
650.0000 mg | ORAL_TABLET | Freq: Once | ORAL | Status: AC
  Administered 2016-09-09: 650 mg via ORAL
  Filled 2016-09-09: qty 2

## 2016-09-09 MED ORDER — ONDANSETRON 4 MG PO TBDP: 4.0000 mg | ORAL_TABLET | Freq: Three times a day (TID) | ORAL | 0 refills | Status: DC | PRN

## 2016-09-09 MED ORDER — ONDANSETRON HCL 4 MG/2ML IJ SOLN
4.0000 mg | Freq: Four times a day (QID) | INTRAMUSCULAR | Status: DC | PRN
  Administered 2016-09-10 – 2016-09-14 (×4): 4 mg via INTRAVENOUS
  Filled 2016-09-09 (×5): qty 2

## 2016-09-09 MED ORDER — IOPAMIDOL (ISOVUE-300) INJECTION 61%
INTRAVENOUS | Status: AC
  Administered 2016-09-09: 30 mL via ORAL
  Filled 2016-09-09: qty 30

## 2016-09-09 MED ORDER — FENTANYL CITRATE (PF) 100 MCG/2ML IJ SOLN
25.0000 ug | Freq: Once | INTRAMUSCULAR | Status: AC
  Administered 2016-09-09: 25 ug via INTRAVENOUS
  Filled 2016-09-09: qty 2

## 2016-09-09 MED ORDER — FENTANYL CITRATE (PF) 100 MCG/2ML IJ SOLN
12.5000 ug | INTRAMUSCULAR | Status: DC | PRN
  Administered 2016-09-10 – 2016-09-11 (×9): 25 ug via INTRAVENOUS
  Filled 2016-09-09 (×9): qty 2

## 2016-09-09 MED ORDER — METRONIDAZOLE IN NACL 5-0.79 MG/ML-% IV SOLN
500.0000 mg | Freq: Three times a day (TID) | INTRAVENOUS | Status: DC
  Administered 2016-09-09 – 2016-09-12 (×9): 500 mg via INTRAVENOUS
  Filled 2016-09-09 (×10): qty 100

## 2016-09-09 MED ORDER — AMOXICILLIN-POT CLAVULANATE 875-125 MG PO TABS
1.0000 | ORAL_TABLET | Freq: Once | ORAL | Status: AC
  Administered 2016-09-09: 1 via ORAL
  Filled 2016-09-09: qty 1

## 2016-09-09 NOTE — ED Triage Notes (Signed)
Pt c/o bilateral foot pain and swelling x 2 days, chest pain x 3 days, abdominal pain x 1 week, back pain, bilateral leg pain, constipation x several months. Took laxative with minimal constipation relief.

## 2016-09-09 NOTE — ED Notes (Signed)
Assisted pt to get dressed to go home  Pt c/o back pain  When sat pt up on the side of the bed to get her in a wheelchair pt began crying and moaning  Pt states she cannot get up at this time  Assisted pt back into bed  Pt continues to cry and states she just cannot go home like this  Spoke with Dr Criss AlvineGoldston which in turn went in and spoke with the family Orders received

## 2016-09-09 NOTE — ED Provider Notes (Signed)
[facility]-EMERGENCY DEPT Provider Note   CSN: [id number] Arrival date & time: 09/09/16  1438     History   Chief Complaint Chief Complaint  Patient presents with  . Foot Swelling  . Abdominal Pain  . Chest Pain  . Back Pain    HPI Amanda [name] is a 83 y.o. female.  HPI  83 year old female with a history of atrial fibrillation, hypothyroidism, hypertension presents with abdominal pain, constipation, and vomiting. History is taken from patient and family. For the last for 5 days she has had intermittent abdominal pain. She has also had minimal bowel movements and what she has had has been small. They tried magnesium citrate with minimal relief. She has also had vomiting on and off. No clear inciting factors in this. She has had dark stool for several weeks but family says this may be due to Pepto-Bismol being given. She denies chest pain and has not seemed to be short of breath. She has had waxing and waning bilateral pedal edema for the last 1 week. Daughter would like her to have a scan because she states she's been sick for "2 months" although the symptoms seem to be new over the last 4 or 5 days. No urinary symptoms.  Past Medical History:  Diagnosis Date  . Anxiety   . Arthritis   . Atrial fibrillation (HCC)   . Atrial fibrillation (HCC)   . Back pain   . Dysrhythmia   . GERD (gastroesophageal reflux disease)   . HLD (hyperlipidemia)   . Hypertension   . Hypothyroidism   . Insomnia   . Memory impairment   . Osteoarthritis    knee  . Osteoporosis   . PUD (peptic ulcer disease)    hx of upper GI bleeding   . Wears glasses     Patient Active Problem List   Diagnosis Date Noted  . Colitis 09/09/2016  . Thoracic compression fracture, closed, initial encounter (HCC) 09/09/2016  . Right inguinal hernia 05/14/2015  . Atrial fibrillation, persistent (HCC) 02/21/2015  . Femoral neck fracture (HCC) 04/23/2014  . Hip fracture due to osteoporosis (HCC) 04/23/2014  . Long term  current use of anticoagulant therapy 10/07/2010  . Essential hypertension 07/21/2009  . CHEST PAIN 07/11/2009  . Hypothyroidism 07/03/2009  . Hyperlipidemia 07/03/2009  . GERD 07/03/2009  . OSTEOARTHRITIS, KNEE 07/03/2009  . Intractable back pain 07/03/2009  . Osteoporosis 07/03/2009    Past Surgical History:  Procedure Laterality Date  . COLONOSCOPY    . ESOPHAGOGASTRODUODENOSCOPY    . HIP ARTHROPLASTY Right 04/25/2014   Procedure: ARTHROPLASTY BIPOLAR HIP;  Surgeon: Sheral Apleyimothy D Murphy, MD;  [facility];  Service: Orthopedics;  Laterality: Right;  . INGUINAL HERNIA REPAIR Right 05/14/2015   Procedure: RIGHT INGUINAL HERNIA REPAIR WITH MESH;  Surgeon: Chevis PrettyPaul Toth III, MD;  Location: [facility] OR;  Service: General;  Laterality: Right;  . INSERTION OF MESH Right 05/14/2015   Procedure: INSERTION OF MESH;  Surgeon: Chevis PrettyPaul Toth III, MD;  Location: [facility] OR;  Service: General;  Laterality: Right;  . tumor removed from right side of face      OB History    No data available       Home Medications    Prior to Admission medications   Medication Sig Start Date End Date Taking? Authorizing Provider  acetaminophen (TYLENOL) 325 MG tablet Take 650 mg by mouth every 6 (six) hours as needed for moderate pain.    Yes [provider]  docusate sodium (COLACE)  250 MG capsule Take 1 capsule (250 mg total) by mouth daily. 07/09/16  Yes Law, Waylan Boga, PA-C  levothyroxine (SYNTHROID, LEVOTHROID) 75 MCG tablet TAKE ONE TABLET BY MOUTH ONCE DAILY 09/01/16  Yes [provider]  metoprolol tartrate (LOPRESSOR) 25 MG tablet TAKE ONE TABLET BY MOUTH TWICE DAILY-MUST BE SEEN 06/14/13  Yes Lars Masson, MD  QUEtiapine (SEROQUEL) 25 MG tablet Take 25 mg by mouth 2 (two) times daily.    Yes [provider]  warfarin (COUMADIN) 5 MG tablet Take 2.5 mg by mouth daily.   Yes [provider]  amoxicillin-clavulanate (AUGMENTIN) 875-125 MG tablet Take 1 tablet by mouth 2 (two) times  daily. One po bid x 7 days 09/09/16   Pricilla Loveless, MD  ondansetron (ZOFRAN ODT) 4 MG disintegrating tablet Take 1 tablet (4 mg total) by mouth every 8 (eight) hours as needed for nausea or vomiting. 09/09/16   Pricilla Loveless, MD  oxyCODONE-acetaminophen (PERCOCET/ROXICET) 5-325 MG tablet Take 1-2 tablets by mouth every 6 (six) hours as needed for severe pain. Patient not taking: Reported on 09/09/2016 07/09/16   Emi Holes, PA-C    Family History Family History  Problem Relation Age of Onset  . Kidney cancer Unknown        family hx  . Coronary artery disease Unknown        family hx  . Arthritis Unknown        family hx    Social History Social History  Substance Use Topics  . Smoking status: Never Smoker  . Smokeless tobacco: Never Used     Comment: tobacco use - no  . Alcohol use No     Allergies   Alendronate; Aspirin; Aspirin-acetaminophen-caffeine; Nsaids; Sulfamethoxazole; Sulfonamide derivatives; Cephalexin; and Codeine   Review of Systems Review of Systems  Respiratory: Negative for shortness of breath.   Cardiovascular: Positive for leg swelling. Negative for chest pain.  Gastrointestinal: Positive for abdominal distention, abdominal pain, constipation, nausea and vomiting. Negative for blood in stool.  Genitourinary: Negative for dysuria.  All other systems reviewed and are negative.    Physical Exam Updated Vital Signs BP (!) 155/92 (BP Location: Left Arm)   Pulse 83   Temp 97.9 F (36.6 C) (Oral)   Resp 16   Ht 5\' 2"  (1.575 m)   Wt 52.3 kg (115 lb 4.8 oz)   SpO2 98%   BMI 21.09 kg/m   Physical Exam  Constitutional: She is oriented to person, place, and time. She appears well-developed and well-nourished. No distress.  HENT:  Head: Normocephalic and atraumatic.  Right Ear: External ear normal.  Left Ear: External ear normal.  Nose: Nose normal.  Eyes: Right eye exhibits no discharge. Left eye exhibits no discharge.  Cardiovascular: Normal  rate and normal heart sounds.  An irregular rhythm present.  Pulses:      Dorsalis pedis pulses are 2+ on the right side, and 2+ on the left side.  Pulmonary/Chest: Effort normal and breath sounds normal. She has no wheezes. She has no rales.  Abdominal: Soft. There is tenderness (diffuse, worst in lower abdomen).  Genitourinary: Rectal exam shows no external hemorrhoid and guaiac negative stool.  Genitourinary Comments: Minimal stool in the vault, no impaction. No gross blood.  Musculoskeletal: She exhibits edema (pedal/ankle edema bilaterally).  Neurological: She is alert and oriented to person, place, and time.  Skin: Skin is warm and dry. She is not diaphoretic.  Nursing note and vitals reviewed.  ED Treatments / Results  Labs (all labs ordered are listed, but only abnormal results are displayed) Labs Reviewed  URINALYSIS, ROUTINE W REFLEX MICROSCOPIC - Abnormal; Notable for the following:       Result Value   APPearance HAZY (*)    Protein, ur 100 (*)    Leukocytes, UA SMALL (*)    Squamous Epithelial / LPF 0-5 (*)    All other components within normal limits  COMPREHENSIVE METABOLIC PANEL - Abnormal; Notable for the following:    Glucose, Bld 118 (*)    Creatinine, Ser 1.02 (*)    GFR calc non Af Amer 47 (*)    GFR calc Af Amer 54 (*)    All other components within normal limits  BRAIN NATRIURETIC PEPTIDE - Abnormal; Notable for the following:    B Natriuretic Peptide 153.9 (*)    All other components within normal limits  PROTIME-INR - Abnormal; Notable for the following:    Prothrombin Time 24.4 (*)    All other components within normal limits  CBC  LIPASE, BLOOD  CBC  BASIC METABOLIC PANEL  PROTIME-INR  I-STAT TROPOININ, ED  POC OCCULT BLOOD, ED  POCT I-STAT TROPONIN I  OCCULT BLOOD, POC DEVICE    EKG  EKG Interpretation  Date/Time:  Thursday September 09 2016 15:24:28 EDT Ventricular Rate:  73 PR Interval:    QRS Duration: 106 QT Interval:  377 QTC  Calculation: 416 R Axis:   -21 Text Interpretation:  likely sinus rhythm, baseline artifact Borderline left axis deviation Anterior infarct, old Borderline T abnormalities, inferior leads T wave changes new compared to 2016 Confirmed by Pricilla Loveless [phone]) on 09/09/2016 4:02:58 PM       Radiology Dg Chest 2 View  Result Date: 09/09/2016 CLINICAL DATA:  Three day history of chest pain. EXAM: CHEST  2 VIEW COMPARISON:  04/23/2014 FINDINGS: AP and lateral views of the chest show hyperexpansion. The cardio pericardial silhouette is enlarged. No edema or focal airspace consolidation. Tiny right pleural effusion. The visualized bony structures of the thorax are intact. Telemetry leads overlie the chest. IMPRESSION: 1. Hyperexpansion with cardiomegaly. 2. Tiny right pleural effusion. Electronically Signed   By: Kennith Center M.D.   On: 09/09/2016 16:44   Ct Abdomen Pelvis W Contrast  Result Date: 09/09/2016 CLINICAL DATA:  83 year old female with acute abdominal pain and vomiting for 1 week. EXAM: CT ABDOMEN AND PELVIS WITH CONTRAST TECHNIQUE: Multidetector CT imaging of the abdomen and pelvis was performed using the standard protocol following bolus administration of intravenous contrast. CONTRAST:  75 cc intravenous Isovue-300 COMPARISON:  07/06/2016 lumbar spine MR and radiographs. FINDINGS: Right hip arthroplasty artifact decreases sensitivity within the pelvis. Lower chest: A trace right pleural effusion is noted. Mild bibasilar atelectasis/ scarring noted. Cardiomegaly is identified. Hepatobiliary: The liver and gallbladder are unremarkable. No biliary dilatation. Pancreas: Unremarkable Spleen: Unremarkable Adrenals/Urinary Tract: The kidneys, adrenal glands and bladder are unremarkable except for bilateral renal atrophy and cysts. Stomach/Bowel: There is inflammation adjacent to the colon at the hepatic flexure but no definite adjacent bowel wall thickening - question epiploic appendagitis or possibly  focal diverticulitis. There is no evidence of bowel obstruction, pneumoperitoneum, abscess or definite bowel wall thickening. Descending and sigmoid colonic diverticulosis noted. The appendix is normal. Vascular/Lymphatic: Aortic atherosclerosis. No enlarged abdominal or pelvic lymph nodes. Reproductive: Uterus and bilateral adnexa are unremarkable. Other: No ascites. Musculoskeletal: A horizontal fracture through the T11 vertebral body is noted with 50% compression, appears new since 07/06/2016 radiographs.  This fracture does not definitely extend into the posterior elements. Remote compression fractures of L2 and L4 again noted. Multilevel degenerative disc disease and facet arthropathy again noted. IMPRESSION: Inflammation adjacent to the colon at the hepatic flexure -question epiploic appendagitis or possibly focal diverticulitis. No evidence of bowel obstruction, pneumoperitoneum or abscess. 50% compression fracture of T11, appears new since 07/06/2016 but may be subacute. Trace right pleural effusion. Remote L2 and L4 compression fractures. Aortic Atherosclerosis (ICD10-I70.0). Electronically Signed   By: Harmon Pier M.D.   On: 09/09/2016 20:19    Procedures Procedures (including critical care time)  Medications Ordered in ED Medications  fentaNYL (SUBLIMAZE) injection 12.5-25 mcg (not administered)  acetaminophen (TYLENOL) tablet 650 mg (not administered)  ondansetron (ZOFRAN) injection 4 mg (not administered)  metroNIDAZOLE (FLAGYL) IVPB 500 mg (0 mg Intravenous Stopped 09/10/16 0055)  ciprofloxacin (CIPRO) IVPB 400 mg (400 mg Intravenous New Bag/Given 09/10/16 0054)  levothyroxine (SYNTHROID, LEVOTHROID) tablet 75 mcg (not administered)  docusate sodium (COLACE) capsule 200 mg (not administered)  QUEtiapine (SEROQUEL) tablet 25 mg (25 mg Oral Given 09/10/16 0102)  metoprolol tartrate (LOPRESSOR) tablet 25 mg (25 mg Oral Given 09/10/16 0102)  sodium chloride flush (NS) 0.9 % injection 3 mL (3 mLs  Intravenous Not Given 09/10/16 0055)  sodium chloride flush (NS) 0.9 % injection 3 mL (not administered)  0.9 %  sodium chloride infusion (250 mLs Intravenous New Bag/Given 09/10/16 0120)  senna-docusate (Senokot-S) tablet 1 tablet (not administered)  bisacodyl (DULCOLAX) EC tablet 5 mg (not administered)  warfarin (COUMADIN) tablet 2.5 mg (not administered)  Warfarin - Pharmacist Dosing Inpatient (not administered)  iopamidol (ISOVUE-300) 61 % injection (75 mLs Intravenous Contrast Given 09/09/16 1940)  iopamidol (ISOVUE-300) 61 % injection (30 mLs Oral Contrast Given 09/09/16 1940)  amoxicillin-clavulanate (AUGMENTIN) 875-125 MG per tablet 1 tablet (1 tablet Oral Given 09/09/16 2126)  acetaminophen (TYLENOL) tablet 650 mg (650 mg Oral Given 09/09/16 2150)  fentaNYL (SUBLIMAZE) injection 25 mcg (25 mcg Intravenous Given 09/09/16 2253)     Initial Impression / Assessment and Plan / ED Course  I have reviewed the triage vital signs and the nursing notes.  Pertinent labs & imaging results that were available during my care of the patient were reviewed by me and considered in my medical decision making (see chart for details).     Patient's abd pain is likely from colitis. No clear source of constipation. No obstruction or impaction. Colitis appears mild, however despite tylenol (usually works for her), her back pain is so severe she cannot get out of the stretcher. Unclear when this T11 fx occurred. Given poor pain control, will give IV fentanyl, admit for obs and further pain control  Final Clinical Impressions(s) / ED Diagnoses   Final diagnoses:  Colitis    New Prescriptions Current Discharge Medication List    START taking these medications   Details  amoxicillin-clavulanate (AUGMENTIN) 875-125 MG tablet Take 1 tablet by mouth 2 (two) times daily. One po bid x 7 days Qty: 14 tablet, Refills: 0    ondansetron (ZOFRAN ODT) 4 MG disintegrating tablet Take 1 tablet (4 mg total) by mouth every 8  (eight) hours as needed for nausea or vomiting. Qty: 8 tablet, Refills: 0         Pricilla Loveless, MD 09/10/16 [phone]

## 2016-09-09 NOTE — ED Notes (Signed)
Patient transported to CT 

## 2016-09-09 NOTE — ED Notes (Signed)
Assisted pt with bedpan  Pt voided moderate amt of clear light yellow urine  Tolerated well

## 2016-09-09 NOTE — ED Notes (Signed)
Admitting dr at bedside.  

## 2016-09-09 NOTE — ED Notes (Signed)
Returned from CT. Family at bedside.

## 2016-09-10 ENCOUNTER — Observation Stay (HOSPITAL_COMMUNITY): Payer: Medicare Other

## 2016-09-10 ENCOUNTER — Encounter (HOSPITAL_COMMUNITY): Payer: Self-pay

## 2016-09-10 DIAGNOSIS — S71001D Unspecified open wound, right hip, subsequent encounter: Secondary | ICD-10-CM | POA: Diagnosis not present

## 2016-09-10 DIAGNOSIS — M4854XA Collapsed vertebra, not elsewhere classified, thoracic region, initial encounter for fracture: Secondary | ICD-10-CM | POA: Diagnosis present

## 2016-09-10 DIAGNOSIS — L8990 Pressure ulcer of unspecified site, unspecified stage: Secondary | ICD-10-CM | POA: Diagnosis present

## 2016-09-10 DIAGNOSIS — I119 Hypertensive heart disease without heart failure: Secondary | ICD-10-CM | POA: Diagnosis present

## 2016-09-10 DIAGNOSIS — I481 Persistent atrial fibrillation: Secondary | ICD-10-CM | POA: Diagnosis not present

## 2016-09-10 DIAGNOSIS — E039 Hypothyroidism, unspecified: Secondary | ICD-10-CM | POA: Diagnosis present

## 2016-09-10 DIAGNOSIS — I480 Paroxysmal atrial fibrillation: Secondary | ICD-10-CM | POA: Diagnosis present

## 2016-09-10 DIAGNOSIS — Z882 Allergy status to sulfonamides status: Secondary | ICD-10-CM | POA: Diagnosis not present

## 2016-09-10 DIAGNOSIS — Z87310 Personal history of (healed) osteoporosis fracture: Secondary | ICD-10-CM | POA: Diagnosis not present

## 2016-09-10 DIAGNOSIS — Z885 Allergy status to narcotic agent status: Secondary | ICD-10-CM | POA: Diagnosis not present

## 2016-09-10 DIAGNOSIS — Z96643 Presence of artificial hip joint, bilateral: Secondary | ICD-10-CM | POA: Diagnosis present

## 2016-09-10 DIAGNOSIS — R531 Weakness: Secondary | ICD-10-CM | POA: Diagnosis present

## 2016-09-10 DIAGNOSIS — F0391 Unspecified dementia with behavioral disturbance: Secondary | ICD-10-CM | POA: Diagnosis present

## 2016-09-10 DIAGNOSIS — K6389 Other specified diseases of intestine: Secondary | ICD-10-CM | POA: Diagnosis present

## 2016-09-10 DIAGNOSIS — M810 Age-related osteoporosis without current pathological fracture: Secondary | ICD-10-CM | POA: Diagnosis present

## 2016-09-10 DIAGNOSIS — M549 Dorsalgia, unspecified: Secondary | ICD-10-CM | POA: Diagnosis not present

## 2016-09-10 DIAGNOSIS — E785 Hyperlipidemia, unspecified: Secondary | ICD-10-CM | POA: Diagnosis present

## 2016-09-10 DIAGNOSIS — K5792 Diverticulitis of intestine, part unspecified, without perforation or abscess without bleeding: Secondary | ICD-10-CM | POA: Diagnosis not present

## 2016-09-10 DIAGNOSIS — Z881 Allergy status to other antibiotic agents status: Secondary | ICD-10-CM | POA: Diagnosis not present

## 2016-09-10 DIAGNOSIS — Z888 Allergy status to other drugs, medicaments and biological substances status: Secondary | ICD-10-CM | POA: Diagnosis not present

## 2016-09-10 DIAGNOSIS — L89210 Pressure ulcer of right hip, unstageable: Secondary | ICD-10-CM | POA: Diagnosis present

## 2016-09-10 DIAGNOSIS — I482 Chronic atrial fibrillation: Secondary | ICD-10-CM | POA: Diagnosis present

## 2016-09-10 DIAGNOSIS — M1710 Unilateral primary osteoarthritis, unspecified knee: Secondary | ICD-10-CM | POA: Diagnosis present

## 2016-09-10 DIAGNOSIS — Z886 Allergy status to analgesic agent status: Secondary | ICD-10-CM | POA: Diagnosis not present

## 2016-09-10 DIAGNOSIS — S22000A Wedge compression fracture of unspecified thoracic vertebra, initial encounter for closed fracture: Secondary | ICD-10-CM | POA: Diagnosis not present

## 2016-09-10 DIAGNOSIS — Z7901 Long term (current) use of anticoagulants: Secondary | ICD-10-CM | POA: Diagnosis not present

## 2016-09-10 LAB — BASIC METABOLIC PANEL
ANION GAP: 5 (ref 5–15)
BUN: 13 mg/dL (ref 6–20)
CO2: 28 mmol/L (ref 22–32)
Calcium: 8.3 mg/dL — ABNORMAL LOW (ref 8.9–10.3)
Chloride: 101 mmol/L (ref 101–111)
Creatinine, Ser: 0.93 mg/dL (ref 0.44–1.00)
GFR calc Af Amer: >60 mL/min (ref >60)
GFR calc non Af Amer: 52 mL/min — ABNORMAL LOW (ref >60)
GLUCOSE: 103 mg/dL — AB (ref 65–99)
Potassium: 3.7 mmol/L (ref 3.5–5.1)
Sodium: 134 mmol/L — ABNORMAL LOW (ref 135–145)

## 2016-09-10 LAB — PROTIME-INR
INR: 2.26
Prothrombin Time: 25.4 seconds — ABNORMAL HIGH (ref 11.4–15.2)

## 2016-09-10 LAB — CBC
HEMATOCRIT: 36.9 % (ref 36.0–46.0)
Hemoglobin: 12.1 g/dL (ref 12.0–15.0)
MCH: 30.9 pg (ref 26.0–34.0)
MCHC: 32.8 g/dL (ref 30.0–36.0)
MCV: 94.4 fL (ref 78.0–100.0)
PLATELETS: 248 10*3/uL (ref 150–400)
RBC: 3.91 MIL/uL (ref 3.87–5.11)
RDW: 15.5 % (ref 11.5–15.5)
WBC: 8.7 10*3/uL (ref 4.0–10.5)

## 2016-09-10 MED ORDER — BISACODYL 5 MG PO TBEC: 5.0000 mg | DELAYED_RELEASE_TABLET | Freq: Every day | ORAL | Status: DC | PRN

## 2016-09-10 MED ORDER — SODIUM CHLORIDE 0.9 % IV SOLN
250.0000 mL | INTRAVENOUS | Status: DC | PRN
  Administered 2016-09-10: 250 mL via INTRAVENOUS

## 2016-09-10 MED ORDER — WARFARIN SODIUM 2.5 MG PO TABS
2.5000 mg | ORAL_TABLET | Freq: Every day | ORAL | Status: DC
  Administered 2016-09-10: 2.5 mg via ORAL
  Filled 2016-09-10: qty 1

## 2016-09-10 MED ORDER — DOCUSATE SODIUM 100 MG PO CAPS
200.0000 mg | ORAL_CAPSULE | Freq: Every day | ORAL | Status: DC
  Administered 2016-09-10 – 2016-09-15 (×6): 200 mg via ORAL
  Filled 2016-09-10 (×6): qty 2

## 2016-09-10 MED ORDER — SODIUM CHLORIDE 0.9 % IV SOLN
INTRAVENOUS | Status: DC
  Administered 2016-09-10 – 2016-09-11 (×2): via INTRAVENOUS

## 2016-09-10 MED ORDER — LEVOTHYROXINE SODIUM 50 MCG PO TABS
75.0000 ug | ORAL_TABLET | Freq: Every day | ORAL | Status: DC
  Administered 2016-09-10 – 2016-09-15 (×6): 75 ug via ORAL
  Filled 2016-09-10 (×6): qty 1

## 2016-09-10 MED ORDER — SODIUM CHLORIDE 0.9% FLUSH: 3.0000 mL | INTRAVENOUS | Status: DC | PRN

## 2016-09-10 MED ORDER — SENNOSIDES-DOCUSATE SODIUM 8.6-50 MG PO TABS
1.0000 | ORAL_TABLET | Freq: Every evening | ORAL | Status: DC | PRN
  Administered 2016-09-15: 1 via ORAL
  Filled 2016-09-10: qty 1

## 2016-09-10 MED ORDER — HYDROCODONE-ACETAMINOPHEN 5-325 MG PO TABS
1.0000 | ORAL_TABLET | Freq: Four times a day (QID) | ORAL | Status: DC | PRN
  Administered 2016-09-10: 1 via ORAL
  Filled 2016-09-10 (×2): qty 1

## 2016-09-10 MED ORDER — QUETIAPINE FUMARATE 25 MG PO TABS
25.0000 mg | ORAL_TABLET | Freq: Two times a day (BID) | ORAL | Status: DC
  Administered 2016-09-10 – 2016-09-15 (×11): 25 mg via ORAL
  Filled 2016-09-10 (×12): qty 1

## 2016-09-10 MED ORDER — METOPROLOL TARTRATE 25 MG PO TABS
25.0000 mg | ORAL_TABLET | Freq: Two times a day (BID) | ORAL | Status: DC
  Administered 2016-09-10 – 2016-09-15 (×12): 25 mg via ORAL
  Filled 2016-09-10 (×12): qty 1

## 2016-09-10 MED ORDER — WARFARIN - PHARMACIST DOSING INPATIENT: Freq: Every day | Status: DC

## 2016-09-10 MED ORDER — SODIUM CHLORIDE 0.9% FLUSH
3.0000 mL | Freq: Two times a day (BID) | INTRAVENOUS | Status: DC
  Administered 2016-09-13 – 2016-09-14 (×3): 3 mL via INTRAVENOUS

## 2016-09-10 MED ORDER — HYDRALAZINE HCL 20 MG/ML IJ SOLN
5.0000 mg | INTRAMUSCULAR | Status: DC | PRN
Start: 2016-09-10 — End: 2016-09-15
  Filled 2016-09-10: qty 0.5

## 2016-09-10 NOTE — H&P (Signed)
History and Physical    Amanda [name] WGN:[id number] DOB: [birth year] DOA: 09/09/2016  PCP: Barbie Banner, MD   Patient coming from: Home  Chief Complaint: Abdominal pain, back pain, nausea, vomiting   HPI: Amanda [name] is a 83 y.o. female with medical history significant for dementia with behavioral disturbance, chronic atrial fibrillation on warfarin, hypothyroidism, and hypertension, now presenting to the emergency department for evaluation of abdominal pain, back pain, nausea, and nonbloody nonbilious vomiting. Patient is accompanied by her daughter who assists with the history. These symptoms have reportedly been intermittent for the past month or more, but particularly severe and worsening for the past 3-4 days. Patient describes her abdominal pain as severe, crampy in character, generalized, nonradiating, constant, worse with movement, and associated with nausea and vomiting. She denies diarrhea, fevers, or chills. She also denies dysuria or flank pain. Patient's other concern is severe pain in the mid back, described as nonradiating, sharp, constant, and much worse with any movement. She reports being unable to get up from bed due to these symptoms. She reports that the symptoms are progressively worsening.  ED Course: Upon arrival to the ED, patient is found to be afebrile, saturating well on room air, hypertensive 155/110, and with vitals otherwise stable. EKG features atrial fibrillation. Chemstrip panel is unremarkable and CBC is also essentially normal. INR is within the therapeutic range at 2.15, BNP is slightly elevated 254, and troponin is within normal limits. Urinalysis is not particularly suggestive of infection. CT of the abdomen and pelvis was obtained and demonstrates inflammation adjacent to the colon at the hepatic flexure, concerning for a possible epiploic appendagitis versus focal diverticulitis without SBO, pneumoperitoneum, or abscess identified. Also noted on the CT is a  new 50% compression fracture of T11. Chest x-ray is notable for hyperexpansion and cardiomegaly with a tiny right pleural effusion. Patient was treated with fentanyl in the emergency department, but continues to be in severe pain. She remains hemodynamically stable and in no apparent respiratory distress and will be observed on medical surgical unit for ongoing evaluation and management of severe back pain and abdominal pain with nausea and vomiting suspected secondary to new compression fracture and epiploic appendagitis versus focal diverticulitis.  Review of Systems:  All other systems reviewed and apart from HPI, are negative.  Past Medical History:  Diagnosis Date  . Anxiety   . Arthritis   . Atrial fibrillation (HCC)   . Atrial fibrillation (HCC)   . Back pain   . Dysrhythmia   . GERD (gastroesophageal reflux disease)   . HLD (hyperlipidemia)   . Hypertension   . Hypothyroidism   . Insomnia   . Memory impairment   . Osteoarthritis    knee  . Osteoporosis   . PUD (peptic ulcer disease)    hx of upper GI bleeding   . Wears glasses     Past Surgical History:  Procedure Laterality Date  . COLONOSCOPY    . ESOPHAGOGASTRODUODENOSCOPY    . HIP ARTHROPLASTY Right 04/25/2014   Procedure: ARTHROPLASTY BIPOLAR HIP;  Surgeon: Sheral Apley, MD;  Location: [facility] OR;  Service: Orthopedics;  Laterality: Right;  . INGUINAL HERNIA REPAIR Right 05/14/2015   Procedure: RIGHT INGUINAL HERNIA REPAIR WITH MESH;  Surgeon: Chevis Pretty III, MD;  Location: [facility] OR;  Service: General;  Laterality: Right;  . INSERTION OF MESH Right 05/14/2015   Procedure: INSERTION OF MESH;  Surgeon: Chevis Pretty III, MD;  Location: [facility] OR;  Service: General;  Laterality:  Right;  . tumor removed from right side of face       reports that she has never smoked. She has never used smokeless tobacco. She reports that she does not drink alcohol or use drugs.  Allergies  Allergen Reactions  . Alendronate Other (See Comments)     GI Upset (intolerance) At risk for GI perforation  . Aspirin Diarrhea and Other (See Comments)    Melena, black tarry stools  . Aspirin-Acetaminophen-Caffeine Other (See Comments)    Melena, Dark stool  . Nsaids Other (See Comments)    Patient has ulcer   . Sulfamethoxazole Hives  . Sulfonamide Derivatives Hives  . Cephalexin Rash  . Codeine Nausea And Vomiting    GI upset    Family History  Problem Relation Age of Onset  . Kidney cancer Unknown        family hx  . Coronary artery disease Unknown        family hx  . Arthritis Unknown        family hx     Prior to Admission medications   Medication Sig Start Date End Date Taking? Authorizing Provider  acetaminophen (TYLENOL) 325 MG tablet Take 650 mg by mouth every 6 (six) hours as needed for moderate pain.    Yes [provider]  docusate sodium (COLACE) 250 MG capsule Take 1 capsule (250 mg total) by mouth daily. 07/09/16  Yes Law, Waylan Boga, PA-C  levothyroxine (SYNTHROID, LEVOTHROID) 75 MCG tablet TAKE ONE TABLET BY MOUTH ONCE DAILY 09/01/16  Yes [provider]  metoprolol tartrate (LOPRESSOR) 25 MG tablet TAKE ONE TABLET BY MOUTH TWICE DAILY-MUST BE SEEN 06/14/13  Yes Lars Masson, MD  QUEtiapine (SEROQUEL) 25 MG tablet Take 25 mg by mouth 2 (two) times daily.    Yes [provider]  warfarin (COUMADIN) 5 MG tablet Take 2.5 mg by mouth daily.   Yes [provider]  amoxicillin-clavulanate (AUGMENTIN) 875-125 MG tablet Take 1 tablet by mouth 2 (two) times daily. One po bid x 7 days 09/09/16   Pricilla Loveless, MD  ondansetron (ZOFRAN ODT) 4 MG disintegrating tablet Take 1 tablet (4 mg total) by mouth every 8 (eight) hours as needed for nausea or vomiting. 09/09/16   Pricilla Loveless, MD  oxyCODONE-acetaminophen (PERCOCET/ROXICET) 5-325 MG tablet Take 1-2 tablets by mouth every 6 (six) hours as needed for severe pain. Patient not taking: Reported on 09/09/2016 07/09/16   Emi Holes, PA-C     Physical Exam: Vitals:   09/09/16 1520 09/09/16 1722 09/09/16 1928 09/09/16 2128  BP:  (!) 158/91 (!) 158/99 (!) 145/102  Pulse: 78 79 78 74  Resp:  18 16 16   Temp:   97.7 F (36.5 C) 98 F (36.7 C)  TempSrc:   Oral Oral  SpO2:  100% 99% 99%      Constitutional: NAD, in obvious discomfort. No pallor, no diaphoresis.  Eyes: PERTLA, lids and conjunctivae normal ENMT: Mucous membranes are moist. Posterior pharynx clear of any exudate or lesions.   Neck: normal, supple, no masses, no thyromegaly Respiratory: clear to auscultation bilaterally, no wheezing, no crackles. Normal respiratory effort.    Cardiovascular: Rate ~80 and irregular. No significant JVD. Abdomen: No distension, generalized tenderness without rebound pain or guarding. Bowel sounds active.  Musculoskeletal: no clubbing / cyanosis. No joint deformity upper and lower extremities.   Skin: no significant rashes, lesions, ulcers. Warm, dry, well-perfused. Neurologic: CN 2-12 grossly intact. Sensation intact, DTR normal. Strength 5/5 in  all 4 limbs.  Psychiatric: Alert and oriented to person, place, and situation. Mild confusion. Pleasant and cooperative.     Labs on Admission: I have personally reviewed following labs and imaging studies  CBC:  Recent Labs Lab 09/09/16 1623  WBC 8.5  HGB 13.6  HCT 41.3  MCV 94.9  PLT 276   Basic Metabolic Panel:  Recent Labs Lab 09/09/16 1623  NA 139  K 3.7  CL 102  CO2 28  GLUCOSE 118*  BUN 17  CREATININE 1.02*  CALCIUM 9.4   GFR: CrCl cannot be calculated (Unknown ideal weight.). Liver Function Tests:  Recent Labs Lab 09/09/16 1623  AST 40  ALT 20  ALKPHOS 78  BILITOT 0.9  PROT 7.3  ALBUMIN 3.6    Recent Labs Lab 09/09/16 1623  LIPASE 18   No results for input(s): AMMONIA in the last 168 hours. Coagulation Profile:  Recent Labs Lab 09/09/16 1623  INR 2.15   Cardiac Enzymes: No results for input(s): CKTOTAL, CKMB, CKMBINDEX, TROPONINI  in the last 168 hours. BNP (last 3 results) No results for input(s): PROBNP in the last 8760 hours. HbA1C: No results for input(s): HGBA1C in the last 72 hours. CBG: No results for input(s): GLUCAP in the last 168 hours. Lipid Profile: No results for input(s): CHOL, HDL, LDLCALC, TRIG, CHOLHDL, LDLDIRECT in the last 72 hours. Thyroid Function Tests: No results for input(s): TSH, T4TOTAL, FREET4, T3FREE, THYROIDAB in the last 72 hours. Anemia Panel: No results for input(s): VITAMINB12, FOLATE, FERRITIN, TIBC, IRON, RETICCTPCT in the last 72 hours. Urine analysis:    Component Value Date/Time   COLORURINE YELLOW 09/09/2016 1623   APPEARANCEUR HAZY (A) 09/09/2016 1623   LABSPEC 1.020 09/09/2016 1623   PHURINE 6.0 09/09/2016 1623   GLUCOSEU NEGATIVE 09/09/2016 1623   HGBUR NEGATIVE 09/09/2016 1623   BILIRUBINUR NEGATIVE 09/09/2016 1623   KETONESUR NEGATIVE 09/09/2016 1623   PROTEINUR 100 (A) 09/09/2016 1623   UROBILINOGEN 1.0 08/02/2009 1531   NITRITE NEGATIVE 09/09/2016 1623   LEUKOCYTESUR SMALL (A) 09/09/2016 1623   Sepsis Labs: @LABRCNTIP (procalcitonin:4,lacticidven:4) )No results found for this or any previous visit (from the past 240 hour(s)).   Radiological Exams on Admission: Dg Chest 2 View  Result Date: 09/09/2016 CLINICAL DATA:  Three day history of chest pain. EXAM: CHEST  2 VIEW COMPARISON:  04/23/2014 FINDINGS: AP and lateral views of the chest show hyperexpansion. The cardio pericardial silhouette is enlarged. No edema or focal airspace consolidation. Tiny right pleural effusion. The visualized bony structures of the thorax are intact. Telemetry leads overlie the chest. IMPRESSION: 1. Hyperexpansion with cardiomegaly. 2. Tiny right pleural effusion. Electronically Signed   By: Kennith CenterEric  Mansell M.D.   On: 09/09/2016 16:44   Ct Abdomen Pelvis W Contrast  Result Date: 09/09/2016 CLINICAL DATA:  83 year old female with acute abdominal pain and vomiting for 1 week. EXAM: CT  ABDOMEN AND PELVIS WITH CONTRAST TECHNIQUE: Multidetector CT imaging of the abdomen and pelvis was performed using the standard protocol following bolus administration of intravenous contrast. CONTRAST:  75 cc intravenous Isovue-300 COMPARISON:  07/06/2016 lumbar spine MR and radiographs. FINDINGS: Right hip arthroplasty artifact decreases sensitivity within the pelvis. Lower chest: A trace right pleural effusion is noted. Mild bibasilar atelectasis/ scarring noted. Cardiomegaly is identified. Hepatobiliary: The liver and gallbladder are unremarkable. No biliary dilatation. Pancreas: Unremarkable Spleen: Unremarkable Adrenals/Urinary Tract: The kidneys, adrenal glands and bladder are unremarkable except for bilateral renal atrophy and cysts. Stomach/Bowel: There is inflammation adjacent to the colon at  the hepatic flexure but no definite adjacent bowel wall thickening - question epiploic appendagitis or possibly focal diverticulitis. There is no evidence of bowel obstruction, pneumoperitoneum, abscess or definite bowel wall thickening. Descending and sigmoid colonic diverticulosis noted. The appendix is normal. Vascular/Lymphatic: Aortic atherosclerosis. No enlarged abdominal or pelvic lymph nodes. Reproductive: Uterus and bilateral adnexa are unremarkable. Other: No ascites. Musculoskeletal: A horizontal fracture through the T11 vertebral body is noted with 50% compression, appears new since 07/06/2016 radiographs. This fracture does not definitely extend into the posterior elements. Remote compression fractures of L2 and L4 again noted. Multilevel degenerative disc disease and facet arthropathy again noted. IMPRESSION: Inflammation adjacent to the colon at the hepatic flexure -question epiploic appendagitis or possibly focal diverticulitis. No evidence of bowel obstruction, pneumoperitoneum or abscess. 50% compression fracture of T11, appears new since 07/06/2016 but may be subacute. Trace right pleural effusion.  Remote L2 and L4 compression fractures. Aortic Atherosclerosis (ICD10-I70.0). Electronically Signed   By: Harmon Pier M.D.   On: 09/09/2016 20:19    EKG: Independently reviewed. Atrial fibrillation.   Assessment/Plan  1. Compression fracture with intractable pain  - Pt reports severe back pain, in tears due to pain and unable to get up from bed d/t pain despite 2-person assist  - CT reveals a T11 compression fracture, acute or subacute  - Plan to provide analgesia, ask PT to eval and tx   2. ?Diverticulitis vs epiploic appendagitis  - Pt presents with abd pain, N/V - CT reveals inflammation about the hepatic flexure of the colon, likely representing epiploic appendagitis versus focal diverticulitis; no SBO, free-air, or abscess identified  - Start ciprofloxacin and Flagyl  - Continue pain-control, antiemetics   3. Hypertension  - BP elevated in ED, pain likely contributing  - Continue Lopressor, pain-control  - Use hydralazine IVP's prn   4. Persistent atrial fibrillation  - In rate-controlled a fib on presentation  - CHADS-VASc 4 (age x2, gender, HTN)  - Continue warfarin, Lopressor    5. Hypothyroidism  - Continue Synthroid    6. Dementia  - Stable per daughter  - Continue Seroquel for associated agitation    DVT prophylaxis: warfarin Code Status: Full  Family Communication: Daughter updated at bedside Disposition Plan: Observe on med-surg Consults called: None Admission status: Observation     Briscoe Deutscher, MD [facility] Pager [phone]  If 7PM-7AM, please contact night-coverage [email or web address] Password [facility]  09/10/2016, 12:37 AM

## 2016-09-10 NOTE — Progress Notes (Signed)
PROGRESS NOTE    Amanda [name]  VWU:[id number] DOB: 02-Mar-1923 DOA: 09/09/2016 PCP: Barbie Banner, MD    Brief Narrative: Amanda [name] is a 83 y.o. female with medical history significant for dementia with behavioral disturbance, chronic atrial fibrillation on warfarin, hypothyroidism, and hypertension, now presenting to the emergency department for evaluation of abdominal pain, back pain, nausea, and nonbloody nonbilious vomiting. Patient is accompanied by her daughter who assists with the history. These symptoms have reportedly been intermittent for the past month or more, but particularly severe and worsening for the past 3-4 days. Patient describes her abdominal pain as severe, crampy in character, generalized, nonradiating, constant, worse with movement, and associated with nausea and vomiting. She denies diarrhea, fevers, or chills. She also denies dysuria or flank pain. Patient's other concern is severe pain in the mid back, described as nonradiating, sharp, constant, and much worse with any movement. She reports being unable to get up from bed due to these symptoms. She reports that the symptoms are progressively worsening  Assessment & Plan:   Principal Problem:   Intractable back pain Active Problems:   Hypothyroidism   Essential hypertension   Atrial fibrillation, persistent (HCC)   Colitis   Thoracic compression fracture, closed, initial encounter (HCC)   Diverticulitis   Pressure injury of skin  1-Compression fracture with intractable pain  Still requiring IV fentanyl Start oral Vicodin.  Pain management, would not proceed with kyphoplasty in setting of infection.  Patient has decline bracer in the past  PT evaluation.   2-Diverticulitis vs Epiploic  appendagitis;  \continue with IV antibiotics.  Diet as tolerated  3-HTN; continue with metorpolol   A fib;  Continue warfarin, Lopressor   Dementia  - Stable per daughter  - Continue Seroquel for associated  agitation    DVT prophylaxis: (Lovenox/Heparin/SCD's/anticoagulated/None (if comfort care) Code Status: (Full/Partial - specify details) Family Communication: (Specify name, relationship & date discussed. NO "discussed with patient") Disposition Plan: (specify when and where you expect patient to be discharged). Include barriers to DC in this tab.   Consultants:   none   Procedures: none   Antimicrobials: cipro, flagyl.    Subjective: She is complaining of back pain and right hip pain.   Objective: Vitals:   09/09/16 1928 09/09/16 2128 09/10/16 0039 09/10/16 0532  BP: (!) 158/99 (!) 145/102 (!) 155/92 (!) 149/91  Pulse: 78 74 83 77  Resp: 16 16 16 14   Temp: 97.7 F (36.5 C) 98 F (36.7 C) 97.9 F (36.6 C) 97.8 F (36.6 C)  TempSrc: Oral Oral Oral Oral  SpO2: 99% 99% 98% 100%  Weight:   52.3 kg (115 lb 4.8 oz)   Height:   5\' 2"  (1.575 m)     Intake/Output Summary (Last 24 hours) at 09/10/16 1039 Last data filed at 09/10/16 0700  Gross per 24 hour  Intake           799.17 ml  Output                0 ml  Net           799.17 ml   Filed Weights   09/10/16 0039  Weight: 52.3 kg (115 lb 4.8 oz)    Examination:  General exam: Appears calm and comfortable  Respiratory system: Clear to auscultation. Respiratory effort normal. Cardiovascular system: S1 & S2 heard, RRR. No JVD, murmurs, rubs, gallops or clicks. No pedal edema. Gastrointestinal system: Abdomen is nondistended, soft and nontender. No  organomegaly or masses felt. Normal bowel sounds heard. Central nervous system: Alert and oriented. No focal neurological deficits. Extremities: Symmetric 5 x 5 power. Skin: chronic black scar right hip. No significant redness.      Data Reviewed: I have personally reviewed following labs and imaging studies  CBC:  Recent Labs Lab 09/09/16 1623 09/10/16 0347  WBC 8.5 8.7  HGB 13.6 12.1  HCT 41.3 36.9  MCV 94.9 94.4  PLT 276 248   Basic Metabolic  Panel:  Recent Labs Lab 09/09/16 1623 09/10/16 0347  NA 139 134*  K 3.7 3.7  CL 102 101  CO2 28 28  GLUCOSE 118* 103*  BUN 17 13  CREATININE 1.02* 0.93  CALCIUM 9.4 8.3*   GFR: Estimated Creatinine Clearance: 31.2 mL/min (by C-G formula based on SCr of 0.93 mg/dL). Liver Function Tests:  Recent Labs Lab 09/09/16 1623  AST 40  ALT 20  ALKPHOS 78  BILITOT 0.9  PROT 7.3  ALBUMIN 3.6    Recent Labs Lab 09/09/16 1623  LIPASE 18   No results for input(s): AMMONIA in the last 168 hours. Coagulation Profile:  Recent Labs Lab 09/09/16 1623 09/10/16 0347  INR 2.15 2.26   Cardiac Enzymes: No results for input(s): CKTOTAL, CKMB, CKMBINDEX, TROPONINI in the last 168 hours. BNP (last 3 results) No results for input(s): PROBNP in the last 8760 hours. HbA1C: No results for input(s): HGBA1C in the last 72 hours. CBG: No results for input(s): GLUCAP in the last 168 hours. Lipid Profile: No results for input(s): CHOL, HDL, LDLCALC, TRIG, CHOLHDL, LDLDIRECT in the last 72 hours. Thyroid Function Tests: No results for input(s): TSH, T4TOTAL, FREET4, T3FREE, THYROIDAB in the last 72 hours. Anemia Panel: No results for input(s): VITAMINB12, FOLATE, FERRITIN, TIBC, IRON, RETICCTPCT in the last 72 hours. Sepsis Labs: No results for input(s): PROCALCITON, LATICACIDVEN in the last 168 hours.  No results found for this or any previous visit (from the past 240 hour(s)).       Radiology Studies: Dg Chest 2 View  Result Date: 09/09/2016 CLINICAL DATA:  Three day history of chest pain. EXAM: CHEST  2 VIEW COMPARISON:  04/23/2014 FINDINGS: AP and lateral views of the chest show hyperexpansion. The cardio pericardial silhouette is enlarged. No edema or focal airspace consolidation. Tiny right pleural effusion. The visualized bony structures of the thorax are intact. Telemetry leads overlie the chest. IMPRESSION: 1. Hyperexpansion with cardiomegaly. 2. Tiny right pleural effusion.  Electronically Signed   By: Kennith CenterEric  Mansell M.D.   On: 09/09/2016 16:44   Ct Abdomen Pelvis W Contrast  Result Date: 09/09/2016 CLINICAL DATA:  83 year old female with acute abdominal pain and vomiting for 1 week. EXAM: CT ABDOMEN AND PELVIS WITH CONTRAST TECHNIQUE: Multidetector CT imaging of the abdomen and pelvis was performed using the standard protocol following bolus administration of intravenous contrast. CONTRAST:  75 cc intravenous Isovue-300 COMPARISON:  07/06/2016 lumbar spine MR and radiographs. FINDINGS: Right hip arthroplasty artifact decreases sensitivity within the pelvis. Lower chest: A trace right pleural effusion is noted. Mild bibasilar atelectasis/ scarring noted. Cardiomegaly is identified. Hepatobiliary: The liver and gallbladder are unremarkable. No biliary dilatation. Pancreas: Unremarkable Spleen: Unremarkable Adrenals/Urinary Tract: The kidneys, adrenal glands and bladder are unremarkable except for bilateral renal atrophy and cysts. Stomach/Bowel: There is inflammation adjacent to the colon at the hepatic flexure but no definite adjacent bowel wall thickening - question epiploic appendagitis or possibly focal diverticulitis. There is no evidence of bowel obstruction, pneumoperitoneum, abscess or definite bowel  wall thickening. Descending and sigmoid colonic diverticulosis noted. The appendix is normal. Vascular/Lymphatic: Aortic atherosclerosis. No enlarged abdominal or pelvic lymph nodes. Reproductive: Uterus and bilateral adnexa are unremarkable. Other: No ascites. Musculoskeletal: A horizontal fracture through the T11 vertebral body is noted with 50% compression, appears new since 07/06/2016 radiographs. This fracture does not definitely extend into the posterior elements. Remote compression fractures of L2 and L4 again noted. Multilevel degenerative disc disease and facet arthropathy again noted. IMPRESSION: Inflammation adjacent to the colon at the hepatic flexure -question epiploic  appendagitis or possibly focal diverticulitis. No evidence of bowel obstruction, pneumoperitoneum or abscess. 50% compression fracture of T11, appears new since 07/06/2016 but may be subacute. Trace right pleural effusion. Remote L2 and L4 compression fractures. Aortic Atherosclerosis (ICD10-I70.0). Electronically Signed   By: Harmon Pier M.D.   On: 09/09/2016 20:19        Scheduled Meds: . docusate sodium  200 mg Oral Daily  . levothyroxine  75 mcg Oral QAC breakfast  . metoprolol tartrate  25 mg Oral BID  . QUEtiapine  25 mg Oral BID  . sodium chloride flush  3 mL Intravenous Q12H  . warfarin  2.5 mg Oral q1800  . Warfarin - Pharmacist Dosing Inpatient   Does not apply q1800   Continuous Infusions: . sodium chloride 250 mL (09/10/16 0605)  . ciprofloxacin 400 mg (09/10/16 1021)  . metronidazole Stopped (09/10/16 0652)     LOS: 0 days    Time spent: 35 minutes.     Alba Cory, MD [facility] Pager [phone]  If 7PM-7AM, please contact night-coverage [email or web address] Password [email or web address] 09/10/2016, 10:39 AM

## 2016-09-10 NOTE — Progress Notes (Signed)
PT Cancellation Note  Patient Details Name: Amanda [name] MRN: [id number] DOB: 09/01/1922   Cancelled Treatment:    Reason Eval/Treat Not Completed: Pain limiting ability to participate Pt and daughter report pt just received IV pain meds and pain now minimal however pt declined mobility at this time.  Pt reports MD in room earlier and stated they would order more imaging of hip since she has hx of surgery.   Pt prefers to wait to mobilize until she has pain better controlled.   Robbert Langlinais,KATHrine E 09/10/2016, 9:23 AM Zenovia JarredKati Mychaela Lennartz, PT, DPT 09/10/2016 Pager: [phone]

## 2016-09-10 NOTE — Progress Notes (Signed)
ANTICOAGULATION CONSULT NOTE - Follow Up Consult  Pharmacy Consult for warfarin Indication: atrial fibrillation  Allergies  Allergen Reactions  . Alendronate Other (See Comments)    GI Upset (intolerance) At risk for GI perforation  . Aspirin Diarrhea and Other (See Comments)    Melena, black tarry stools  . Aspirin-Acetaminophen-Caffeine Other (See Comments)    Melena, Dark stool  . Nsaids Other (See Comments)    Patient has ulcer   . Sulfamethoxazole Hives  . Sulfonamide Derivatives Hives  . Cephalexin Rash  . Codeine Nausea And Vomiting    GI upset    Patient Measurements: Height: 5\' 2"  (157.5 cm) Weight: 115 lb 4.8 oz (52.3 kg) IBW/kg (Calculated) : 50.1  Vital Signs: Temp: 97.8 F (36.6 C) (07/06 0532) Temp Source: Oral (07/06 0532) BP: 149/91 (07/06 0532) Pulse Rate: 77 (07/06 0532)  Labs:  Recent Labs  09/09/16 1623 09/10/16 0347  HGB 13.6 12.1  HCT 41.3 36.9  PLT 276 248  LABPROT 24.4* 25.4*  INR 2.15 2.26  CREATININE 1.02* 0.93    Estimated Creatinine Clearance: 31.2 mL/min (by C-G formula based on SCr of 0.93 mg/dL).  Assessment: 89 yoF on warfarin for atrial fibrillation admitted with compression fracture and abdominal pain. PTA warfarin dose reported as 2.5 mg daily.    Today, 09/10/2016: - INR therapeutic. Last warfarin dose 7/4 1900. Pharmacy ordered 2.5 mg early this morning for missed dose 7/5 but dose was refused. - Hgb, platelets WNL - Drug interactions: Cipro and Flagyl started, may need reduced warfarin dose  Goal of Therapy:  INR 2-3   Plan:  Warfarin 2.5 mg daily. Give today's dose now since missed yesterday's dose.  INR trended up so will not increase dose. Daily INR.  Clance Bollunyon, Robin Pafford 09/10/2016,9:35 AM

## 2016-09-10 NOTE — Progress Notes (Signed)
ANTICOAGULATION CONSULT NOTE - Initial Consult  Pharmacy Consult for Warfarin Indication: atrial fibrillation  Allergies  Allergen Reactions  . Alendronate Other (See Comments)    GI Upset (intolerance) At risk for GI perforation  . Aspirin Diarrhea and Other (See Comments)    Melena, black tarry stools  . Aspirin-Acetaminophen-Caffeine Other (See Comments)    Melena, Dark stool  . Nsaids Other (See Comments)    Patient has ulcer   . Sulfamethoxazole Hives  . Sulfonamide Derivatives Hives  . Cephalexin Rash  . Codeine Nausea And Vomiting    GI upset    Patient Measurements: Height: 5\' 2"  (157.5 cm) Weight: 115 lb 4.8 oz (52.3 kg) IBW/kg (Calculated) : 50.1 Heparin Dosing Weight:   Vital Signs: Temp: 97.9 F (36.6 C) (07/06 0039) Temp Source: Oral (07/06 0039) BP: 155/92 (07/06 0039) Pulse Rate: 83 (07/06 0039)  Labs:  Recent Labs  09/09/16 1623  HGB 13.6  HCT 41.3  PLT 276  LABPROT 24.4*  INR 2.15  CREATININE 1.02*    Estimated Creatinine Clearance: 28.4 mL/min (A) (by C-G formula based on SCr of 1.02 mg/dL (H)).   Medical History: Past Medical History:  Diagnosis Date  . Anxiety   . Arthritis   . Atrial fibrillation (HCC)   . Atrial fibrillation (HCC)   . Back pain   . Dysrhythmia   . GERD (gastroesophageal reflux disease)   . HLD (hyperlipidemia)   . Hypertension   . Hypothyroidism   . Insomnia   . Memory impairment   . Osteoarthritis    knee  . Osteoporosis   . PUD (peptic ulcer disease)    hx of upper GI bleeding   . Wears glasses     Medications:  Prescriptions Prior to Admission  Medication Sig Dispense Refill Last Dose  . acetaminophen (TYLENOL) 325 MG tablet Take 650 mg by mouth every 6 (six) hours as needed for moderate pain.    09/08/2016 at Unknown time  . docusate sodium (COLACE) 250 MG capsule Take 1 capsule (250 mg total) by mouth daily. 10 capsule 0 09/08/2016 at Unknown time  . levothyroxine (SYNTHROID, LEVOTHROID) 75 MCG  tablet TAKE ONE TABLET BY MOUTH ONCE DAILY   09/09/2016 at Unknown time  . metoprolol tartrate (LOPRESSOR) 25 MG tablet TAKE ONE TABLET BY MOUTH TWICE DAILY-MUST BE SEEN 30 tablet 6 09/09/2016 at 1100  . QUEtiapine (SEROQUEL) 25 MG tablet Take 25 mg by mouth 2 (two) times daily.    09/09/2016 at Unknown time  . warfarin (COUMADIN) 5 MG tablet Take 2.5 mg by mouth daily.   09/08/2016 at 1900  . oxyCODONE-acetaminophen (PERCOCET/ROXICET) 5-325 MG tablet Take 1-2 tablets by mouth every 6 (six) hours as needed for severe pain. (Patient not taking: Reported on 09/09/2016) 15 tablet 0 Completed Course at Unknown time   Scheduled:  . docusate sodium  200 mg Oral Daily  . levothyroxine  75 mcg Oral QAC breakfast  . metoprolol tartrate  25 mg Oral BID  . QUEtiapine  25 mg Oral BID  . sodium chloride flush  3 mL Intravenous Q12H  . warfarin  2.5 mg Oral q1800  . Warfarin - Pharmacist Dosing Inpatient   Does not apply q1800    Assessment: Patient with afib and on chronic warfarin prior to admission with last dose 7/4.  INR > 2 on admit.  Goal of Therapy:  INR 2-3    Plan:  Warfarin 2.5mg  po daily at 1800--first dose now to make for missed 7/5  dose. Daily INR  Darlina Guys, Jacquenette Shone Crowford 09/10/2016,1:28 AM

## 2016-09-11 LAB — BASIC METABOLIC PANEL
Anion gap: 6 (ref 5–15)
BUN: 9 mg/dL (ref 6–20)
CALCIUM: 8 mg/dL — AB (ref 8.9–10.3)
CHLORIDE: 103 mmol/L (ref 101–111)
CO2: 26 mmol/L (ref 22–32)
CREATININE: 0.93 mg/dL (ref 0.44–1.00)
GFR calc Af Amer: >60 mL/min (ref >60)
GFR calc non Af Amer: 52 mL/min — ABNORMAL LOW (ref >60)
Glucose, Bld: 101 mg/dL — ABNORMAL HIGH (ref 65–99)
Potassium: 3.3 mmol/L — ABNORMAL LOW (ref 3.5–5.1)
SODIUM: 135 mmol/L (ref 135–145)

## 2016-09-11 LAB — PROTIME-INR
INR: 2.23
PROTHROMBIN TIME: 25.1 s — AB (ref 11.4–15.2)

## 2016-09-11 LAB — CBC
HEMATOCRIT: 35.8 % — AB (ref 36.0–46.0)
HEMOGLOBIN: 11.7 g/dL — AB (ref 12.0–15.0)
MCH: 30.8 pg (ref 26.0–34.0)
MCHC: 32.7 g/dL (ref 30.0–36.0)
MCV: 94.2 fL (ref 78.0–100.0)
Platelets: 239 10*3/uL (ref 150–400)
RBC: 3.8 MIL/uL — ABNORMAL LOW (ref 3.87–5.11)
RDW: 15.5 % (ref 11.5–15.5)
WBC: 10.9 10*3/uL — ABNORMAL HIGH (ref 4.0–10.5)

## 2016-09-11 MED ORDER — TRAMADOL HCL 50 MG PO TABS
50.0000 mg | ORAL_TABLET | Freq: Four times a day (QID) | ORAL | Status: DC | PRN
  Administered 2016-09-11 – 2016-09-14 (×7): 50 mg via ORAL
  Filled 2016-09-11 (×7): qty 1

## 2016-09-11 MED ORDER — LIP MEDEX EX OINT
TOPICAL_OINTMENT | CUTANEOUS | Status: AC
  Administered 2016-09-11: 19:00:00
  Filled 2016-09-11: qty 7

## 2016-09-11 MED ORDER — WARFARIN SODIUM 2.5 MG PO TABS
1.2500 mg | ORAL_TABLET | Freq: Once | ORAL | Status: AC
  Administered 2016-09-11: 1.25 mg via ORAL
  Filled 2016-09-11: qty 0.5

## 2016-09-11 MED ORDER — ACETAMINOPHEN 325 MG PO TABS
650.0000 mg | ORAL_TABLET | Freq: Three times a day (TID) | ORAL | Status: DC
  Administered 2016-09-11 – 2016-09-15 (×12): 650 mg via ORAL
  Filled 2016-09-11 (×12): qty 2

## 2016-09-11 MED ORDER — DEXTROSE-NACL 5-0.9 % IV SOLN
INTRAVENOUS | Status: DC
  Administered 2016-09-11: 16:00:00 via INTRAVENOUS
  Administered 2016-09-14: 1000 mL via INTRAVENOUS
  Administered 2016-09-15: 03:00:00 via INTRAVENOUS

## 2016-09-11 MED ORDER — FENTANYL CITRATE (PF) 100 MCG/2ML IJ SOLN
12.5000 ug | INTRAMUSCULAR | Status: DC | PRN
  Administered 2016-09-12 – 2016-09-14 (×5): 12.5 ug via INTRAVENOUS
  Filled 2016-09-11 (×5): qty 2

## 2016-09-11 NOTE — Progress Notes (Signed)
PROGRESS NOTE    Amanda [name]  ZOX:[id number] DOB: 03/28/1922 DOA: 09/09/2016 PCP: Barbie Banner, MD    Brief Narrative: Amanda [name] is a 83 y.o. female with medical history significant for dementia with behavioral disturbance, chronic atrial fibrillation on warfarin, hypothyroidism, and hypertension, now presenting to the emergency department for evaluation of abdominal pain, back pain, nausea, and nonbloody nonbilious vomiting. Patient is accompanied by her daughter who assists with the history. These symptoms have reportedly been intermittent for the past month or more, but particularly severe and worsening for the past 3-4 days. Patient describes her abdominal pain as severe, crampy in character, generalized, nonradiating, constant, worse with movement, and associated with nausea and vomiting. She denies diarrhea, fevers, or chills. She also denies dysuria or flank pain. Patient's other concern is severe pain in the mid back, described as nonradiating, sharp, constant, and much worse with any movement. She reports being unable to get up from bed due to these symptoms. She reports that the symptoms are progressively worsening  Assessment & Plan:   Principal Problem:   Intractable back pain Active Problems:   Hypothyroidism   Essential hypertension   Atrial fibrillation, persistent (HCC)   Colitis   Thoracic compression fracture, closed, initial encounter (HCC)   Diverticulitis   Pressure injury of skin  1-Compression fracture with intractable pain  Still requiring IV fentanyl Didn't tolerated Vicodin. Will start schedule tylenol, tramadol.  Pain management, would not proceed with kyphoplasty in setting of infection.  Patient has decline bracer in the past  PT evaluation.   2-Diverticulitis vs Epiploic  appendagitis;  Continue with IV antibiotics.  Change diet to clear, vomited twice yesterday.  Continue with IV antibiotics.   3-HTN; continue with metorpolol   A fib;    Continue warfarin, Lopressor   Dementia  - Continue Seroquel for associated agitation  -more confuse than usual. Suspect related to pain medication.   DVT prophylaxis: coumadin  Code Status: Full code.  Family Communication: care discussed with son  Disposition Plan: remain in the hospital    Consultants:   none   Procedures: none   Antimicrobials: cipro, flagyl.    Subjective: She has not been eating, vomited twice yesterday.  Still with abdominal pain, not worse.  Back pain some what better   Objective: Vitals:   09/10/16 0532 09/10/16 1412 09/10/16 2121 09/11/16 0540  BP: (!) 149/91 (!) 151/98 (!) 132/95 115/82  Pulse: 77 (!) 56 67 73  Resp: 14 14 16 14   Temp: 97.8 F (36.6 C) 97.9 F (36.6 C) 97.8 F (36.6 C) 97.8 F (36.6 C)  TempSrc: Oral Oral Oral Oral  SpO2: 100% 93% 95% 99%  Weight:      Height:        Intake/Output Summary (Last 24 hours) at 09/11/16 8119 Last data filed at 09/11/16 0541  Gross per 24 hour  Intake              520 ml  Output             2700 ml  Net            -2180 ml   Filed Weights   09/10/16 0039  Weight: 52.3 kg (115 lb 4.8 oz)    Examination:  General exam: NAD Respiratory system: CTA Cardiovascular system: S 1, S 2 RRR Gastrointestinal system: bs present, soft, mild tenderness.  Central nervous system: alert confuse.  Extremities: Symmetric 5 x 5 power. Skin: chronic black scar  right hip. No significant redness.      Data Reviewed: I have personally reviewed following labs and imaging studies  CBC:  Recent Labs Lab 09/09/16 1623 09/10/16 0347 09/11/16 0735  WBC 8.5 8.7 10.9*  HGB 13.6 12.1 11.7*  HCT 41.3 36.9 35.8*  MCV 94.9 94.4 94.2  PLT 276 248 239   Basic Metabolic Panel:  Recent Labs Lab 09/09/16 1623 09/10/16 0347 09/11/16 0735  NA 139 134* 135  K 3.7 3.7 3.3*  CL 102 101 103  CO2 28 28 26   GLUCOSE 118* 103* 101*  BUN 17 13 9   CREATININE 1.02* 0.93 0.93  CALCIUM 9.4 8.3* 8.0*    GFR: Estimated Creatinine Clearance: 31.2 mL/min (by C-G formula based on SCr of 0.93 mg/dL). Liver Function Tests:  Recent Labs Lab 09/09/16 1623  AST 40  ALT 20  ALKPHOS 78  BILITOT 0.9  PROT 7.3  ALBUMIN 3.6    Recent Labs Lab 09/09/16 1623  LIPASE 18   No results for input(s): AMMONIA in the last 168 hours. Coagulation Profile:  Recent Labs Lab 09/09/16 1623 09/10/16 0347 09/11/16 0422  INR 2.15 2.26 2.23   Cardiac Enzymes: No results for input(s): CKTOTAL, CKMB, CKMBINDEX, TROPONINI in the last 168 hours. BNP (last 3 results) No results for input(s): PROBNP in the last 8760 hours. HbA1C: No results for input(s): HGBA1C in the last 72 hours. CBG: No results for input(s): GLUCAP in the last 168 hours. Lipid Profile: No results for input(s): CHOL, HDL, LDLCALC, TRIG, CHOLHDL, LDLDIRECT in the last 72 hours. Thyroid Function Tests: No results for input(s): TSH, T4TOTAL, FREET4, T3FREE, THYROIDAB in the last 72 hours. Anemia Panel: No results for input(s): VITAMINB12, FOLATE, FERRITIN, TIBC, IRON, RETICCTPCT in the last 72 hours. Sepsis Labs: No results for input(s): PROCALCITON, LATICACIDVEN in the last 168 hours.  No results found for this or any previous visit (from the past 240 hour(s)).       Radiology Studies: Dg Chest 2 View  Result Date: 09/09/2016 CLINICAL DATA:  Three day history of chest pain. EXAM: CHEST  2 VIEW COMPARISON:  04/23/2014 FINDINGS: AP and lateral views of the chest show hyperexpansion. The cardio pericardial silhouette is enlarged. No edema or focal airspace consolidation. Tiny right pleural effusion. The visualized bony structures of the thorax are intact. Telemetry leads overlie the chest. IMPRESSION: 1. Hyperexpansion with cardiomegaly. 2. Tiny right pleural effusion. Electronically Signed   By: Kennith Center M.D.   On: 09/09/2016 16:44   Ct Abdomen Pelvis W Contrast  Result Date: 09/09/2016 CLINICAL DATA:  83 year old female  with acute abdominal pain and vomiting for 1 week. EXAM: CT ABDOMEN AND PELVIS WITH CONTRAST TECHNIQUE: Multidetector CT imaging of the abdomen and pelvis was performed using the standard protocol following bolus administration of intravenous contrast. CONTRAST:  75 cc intravenous Isovue-300 COMPARISON:  07/06/2016 lumbar spine MR and radiographs. FINDINGS: Right hip arthroplasty artifact decreases sensitivity within the pelvis. Lower chest: A trace right pleural effusion is noted. Mild bibasilar atelectasis/ scarring noted. Cardiomegaly is identified. Hepatobiliary: The liver and gallbladder are unremarkable. No biliary dilatation. Pancreas: Unremarkable Spleen: Unremarkable Adrenals/Urinary Tract: The kidneys, adrenal glands and bladder are unremarkable except for bilateral renal atrophy and cysts. Stomach/Bowel: There is inflammation adjacent to the colon at the hepatic flexure but no definite adjacent bowel wall thickening - question epiploic appendagitis or possibly focal diverticulitis. There is no evidence of bowel obstruction, pneumoperitoneum, abscess or definite bowel wall thickening. Descending and sigmoid colonic diverticulosis noted.  The appendix is normal. Vascular/Lymphatic: Aortic atherosclerosis. No enlarged abdominal or pelvic lymph nodes. Reproductive: Uterus and bilateral adnexa are unremarkable. Other: No ascites. Musculoskeletal: A horizontal fracture through the T11 vertebral body is noted with 50% compression, appears new since 07/06/2016 radiographs. This fracture does not definitely extend into the posterior elements. Remote compression fractures of L2 and L4 again noted. Multilevel degenerative disc disease and facet arthropathy again noted. IMPRESSION: Inflammation adjacent to the colon at the hepatic flexure -question epiploic appendagitis or possibly focal diverticulitis. No evidence of bowel obstruction, pneumoperitoneum or abscess. 50% compression fracture of T11, appears new since  07/06/2016 but may be subacute. Trace right pleural effusion. Remote L2 and L4 compression fractures. Aortic Atherosclerosis (ICD10-I70.0). Electronically Signed   By: Harmon PierJeffrey  Hu M.D.   On: 09/09/2016 20:19   Dg Hip Unilat With Pelvis 2-3 Views Right  Result Date: 09/10/2016 CLINICAL DATA:  Lateral right hip pain today. EXAM: DG HIP (WITH OR WITHOUT PELVIS) 2-3V RIGHT COMPARISON:  04/23/2014 FINDINGS: Visualization of the pelvis is limited by overlapping bowel gas. Bipolar right hip hemiarthroplasty is located. No periprosthetic fracture. No evidence of pelvic ring or contralateral hip fracture. Lumbar disc and facet degeneration. Remote L4 compression fracture with asymmetric left height loss. IMPRESSION: No acute finding. Electronically Signed   By: Marnee SpringJonathon  Watts M.D.   On: 09/10/2016 17:30        Scheduled Meds: . acetaminophen  650 mg Oral TID PC  . docusate sodium  200 mg Oral Daily  . levothyroxine  75 mcg Oral QAC breakfast  . metoprolol tartrate  25 mg Oral BID  . QUEtiapine  25 mg Oral BID  . sodium chloride flush  3 mL Intravenous Q12H  . warfarin  2.5 mg Oral q1800  . Warfarin - Pharmacist Dosing Inpatient   Does not apply q1800   Continuous Infusions: . sodium chloride 250 mL (09/10/16 0605)  . sodium chloride 75 mL/hr at 09/10/16 1750  . ciprofloxacin Stopped (09/11/16 0052)  . metronidazole Stopped (09/11/16 0620)     LOS: 1 day    Time spent: 35 minutes.     Alba Coryegalado, Emira Eubanks A, MD [facility] Pager [phone]  If 7PM-7AM, please contact night-coverage [email or web address] Password [email or web address] 09/11/2016, 9:22 AM

## 2016-09-11 NOTE — Progress Notes (Signed)
ANTICOAGULATION CONSULT NOTE - Follow Up Consult  Pharmacy Consult for warfarin Indication: atrial fibrillation  Allergies  Allergen Reactions  . Alendronate Other (See Comments)    GI Upset (intolerance) At risk for GI perforation  . Aspirin Diarrhea and Other (See Comments)    Melena, black tarry stools  . Aspirin-Acetaminophen-Caffeine Other (See Comments)    Melena, Dark stool  . Nsaids Other (See Comments)    Patient has ulcer   . Sulfamethoxazole Hives  . Sulfonamide Derivatives Hives  . Cephalexin Rash  . Codeine Nausea And Vomiting    GI upset    Patient Measurements: Height: 5\' 2"  (157.5 cm) Weight: 115 lb 4.8 oz (52.3 kg) IBW/kg (Calculated) : 50.1  Vital Signs: Temp: 97.8 F (36.6 C) (07/07 0540) Temp Source: Oral (07/07 0540) BP: 115/82 (07/07 0540) Pulse Rate: 73 (07/07 0540)  Labs:  Recent Labs  09/09/16 1623 09/10/16 0347 09/11/16 0422 09/11/16 0735  HGB 13.6 12.1  --  11.7*  HCT 41.3 36.9  --  35.8*  PLT 276 248  --  239  LABPROT 24.4* 25.4* 25.1*  --   INR 2.15 2.26 2.23  --   CREATININE 1.02* 0.93  --  0.93    Estimated Creatinine Clearance: 31.2 mL/min (by C-G formula based on SCr of 0.93 mg/dL).  Assessment: 89 yoF on warfarin for atrial fibrillation admitted with compression fracture and abdominal pain. PTA warfarin dose reported as 2.5 mg daily.    Today, 09/11/2016: - INR therapeutic. Warfarin resumed on 7/6.  Last warfarin dose 7/4 1900. Pharmacy ordered 2.5 mg early 7/6am for missed dose 7/5 but dose was refused. - Hgb mildly decreased, platelets WNL - Drug interactions: Cipro and Flagyl started, may need reduced warfarin dose. H/o rash to cephalexin but received cefazolin on 3 separate occassions  Goal of Therapy:  INR 2-3   Plan:   Reduce Warfarin to 1.25mg  po X 1 tonight as I am very concerned about the ciprofloxacin/metronidazole interaction with her advanced age  INR barely trended down despite missing dose 7/5  Daily  INR  Suggest change to IV unasyn 3gm IV q8h or augmentin PO to avoid interaction - patient has been given cefazolin in past and given augmentin upon presentation this admission  Juliette Alcideustin Chenelle Benning, PharmD, BCPS.   Pager: [phone] 09/11/2016 11:12 AM

## 2016-09-11 NOTE — Evaluation (Signed)
Physical Therapy Evaluation Patient Details Name: Amanda [name] MRN: [id number] DOB: May 28, 1922 Today's Date: 09/11/2016   History of Present Illness  83 yo returns to ED due to back pain , abdominal pain, suspected colitis. was at SNF in recent past. Has new T11 compression fracture,  recent  L4 compression fx. PMHx: R THA, Afib, hypothyroidism. Xray negative for hip injury.  Clinical Impression  The patient required total assist of 2 to mobilize to the bed edge. The most complaints are in the hips and abdomen. The son reports that the patient was ambulatory until about 4 days PTA. Pt admitted with above diagnosis. Pt currently with functional limitations due to the deficits listed below (see PT Problem List).  Pt will benefit from skilled PT to increase their independence and safety with mobility to allow discharge to the venue listed below.       Follow Up Recommendations SNF;Supervision/Assistance - 24 hour    Equipment Recommendations  None recommended by PT    Recommendations for Other Services       Precautions / Restrictions Precautions Precautions: Fall      Mobility  Bed Mobility Overal bed mobility: Needs Assistance Bed Mobility: Supine to Sit;Sit to Supine     Supine to sit: Total assist;+2 for physical assistance;+2 for safety/equipment;HOB elevated Sit to supine: Total assist;+2 for safety/equipment   General bed mobility comments: patient is unable to assist with mobility due to complaints of pain. in hips and abdomen. Bed pad utilized  to assist wwith turning and sitting upright.  Transfers                 General transfer comment: unable  Ambulation/Gait                Stairs            Wheelchair Mobility    Modified Rankin (Stroke Patients Only)       Balance Overall balance assessment: Needs assistance;History of Falls Sitting-balance support: Feet supported;Bilateral upper extremity supported Sitting balance-Leahy Scale:  Poor                                       Pertinent Vitals/Pain Pain Assessment: Faces Faces Pain Scale: Hurts worst Pain Location: hips  especially right Pain Descriptors / Indicators: Cramping;Discomfort Pain Intervention(s): Premedicated before session;Repositioned    Home Living Family/patient expects to be discharged to:: Private residence Living Arrangements: Children;Spouse/significant other Available Help at Discharge: Family;Available 24 hours/day;Personal care attendant Type of Home: House Home Access: Stairs to enter Entrance Stairs-Rails: Right;Left;Can reach both Entrance Stairs-Number of Steps: 3 Home Layout: One level Home Equipment: Walker - 2 wheels Additional Comments: lives with son and spouse    Prior Function Level of Independence: Needs assistance   Gait / Transfers Assistance Needed: pt was doing well and walking until abdominal  pain started. Pt now with assist for all mobility and not walking           Hand Dominance        Extremity/Trunk Assessment   Upper Extremity Assessment Upper Extremity Assessment: Generalized weakness    Lower Extremity Assessment Lower Extremity Assessment: Generalized weakness    Cervical / Trunk Assessment Cervical / Trunk Assessment: Kyphotic  Communication      Cognition Arousal/Alertness: Awake/alert Behavior During Therapy: WFL for tasks assessed/performed Overall Cognitive Status: Difficult to assess  General Comments      Exercises     Assessment/Plan    PT Assessment Patient needs continued PT services  PT Problem List Decreased strength;Decreased range of motion;Decreased activity tolerance;Decreased balance;Decreased mobility;Decreased knowledge of precautions;Decreased safety awareness;Decreased knowledge of use of DME;Pain       PT Treatment Interventions DME instruction;Gait training;Functional mobility  training;Therapeutic activities;Therapeutic exercise;Patient/family education    PT Goals (Current goals can be found in the Care Plan section)  Acute Rehab PT Goals Patient Stated Goal: to walk again. PT Goal Formulation: With patient/family Time For Goal Achievement: 09/25/16 Potential to Achieve Goals: Fair    Frequency Min 3X/week   Barriers to discharge Decreased caregiver support      Co-evaluation               AM-PAC PT "6 Clicks" Daily Activity  Outcome Measure Difficulty turning over in bed (including adjusting bedclothes, sheets and blankets)?: Total Difficulty moving from lying on back to sitting on the side of the bed? : Total Difficulty sitting down on and standing up from a chair with arms (e.g., wheelchair, bedside commode, etc,.)?: Total Help needed moving to and from a bed to chair (including a wheelchair)?: Total Help needed walking in hospital room?: Total Help needed climbing 3-5 steps with a railing? : Total 6 Click Score: 6    End of Session   Activity Tolerance: Patient limited by pain Patient left: in bed;with call bell/phone within reach;with bed alarm set;with family/visitor present Nurse Communication: Mobility status PT Visit Diagnosis: Difficulty in walking, not elsewhere classified (R26.2);History of falling (Z91.81);Pain Pain - Right/Left: Right Pain - part of body: Hip    Time: [id number] PT Time Calculation (min) (ACUTE ONLY): 21 min   Charges:   PT Evaluation $PT Eval Low Complexity: 1 Procedure     PT G CodesBlanchard [name]:        Amanda [name] PT [phone]   Amanda [name], Amanda [name] 09/11/2016, 1:02 PM

## 2016-09-12 LAB — PROTIME-INR
INR: 3.05
Prothrombin Time: 32.2 seconds — ABNORMAL HIGH (ref 11.4–15.2)

## 2016-09-12 LAB — BASIC METABOLIC PANEL
ANION GAP: 5 (ref 5–15)
BUN: 7 mg/dL (ref 6–20)
CALCIUM: 7.8 mg/dL — AB (ref 8.9–10.3)
CO2: 25 mmol/L (ref 22–32)
Chloride: 105 mmol/L (ref 101–111)
Creatinine, Ser: 0.82 mg/dL (ref 0.44–1.00)
Glucose, Bld: 105 mg/dL — ABNORMAL HIGH (ref 65–99)
POTASSIUM: 3.5 mmol/L (ref 3.5–5.1)
Sodium: 135 mmol/L (ref 135–145)

## 2016-09-12 LAB — CBC
HEMATOCRIT: 36.7 % (ref 36.0–46.0)
Hemoglobin: 12.2 g/dL (ref 12.0–15.0)
MCH: 31.6 pg (ref 26.0–34.0)
MCHC: 33.2 g/dL (ref 30.0–36.0)
MCV: 95.1 fL (ref 78.0–100.0)
PLATELETS: 234 10*3/uL (ref 150–400)
RBC: 3.86 MIL/uL — AB (ref 3.87–5.11)
RDW: 15.8 % — AB (ref 11.5–15.5)
WBC: 10.3 10*3/uL (ref 4.0–10.5)

## 2016-09-12 MED ORDER — SODIUM CHLORIDE 0.9 % IV SOLN
3.0000 g | Freq: Four times a day (QID) | INTRAVENOUS | Status: DC
  Administered 2016-09-12 – 2016-09-15 (×10): 3 g via INTRAVENOUS
  Filled 2016-09-12 (×11): qty 3

## 2016-09-12 MED ORDER — FENTANYL 12 MCG/HR TD PT72
12.5000 ug | MEDICATED_PATCH | TRANSDERMAL | Status: DC
  Administered 2016-09-12: 12.5 ug via TRANSDERMAL
  Filled 2016-09-12: qty 1

## 2016-09-12 NOTE — Clinical Social Work Note (Signed)
Clinical Social Work Assessment  Patient Details  Name: Amanda [name] MRN: [id number] Date of Birth: [birth year]  Date of referral:  09/12/16               Reason for consult:  Facility Placement                Permission sought to share information with:  Family Supports Permission granted to share information::  Yes, Verbal Permission Granted  Name::     daughter Deloris, son Marine scientist::     Relationship::     Contact Information:     Housing/Transportation Living arrangements for the past 2 months:  Single Family Home Source of Information:  Patient, Adult Children Patient Interpreter Needed:  None Criminal Activity/Legal Involvement Pertinent to Current Situation/Hospitalization:  No - Comment as needed Significant Relationships:  Adult Children Lives with:  Adult Children Do you feel safe going back to the place where you live?  Yes Need for family participation in patient care:  No (Coment)  Care giving concerns:  Pt from home where she resides with son. Currently needing more assistance with mobility and ADLs than prior to hospitalization.   Social Worker assessment / plan:  CSW consulted for potential SNF placement.  Met with pt and daughter at bedside. Both familiar with process as pt went to [facility] [place] earlier this year. Agreeable to rehab referrals. FL2 completed, referrals made. Plan: SNF at [place] for ST rehab- will follow up with bed offers.   Employment status:  Retired Forensic scientist:  [employer] PT Recommendations:  [place] / Referral to community resources:  [place]  Patient/Family's Response to care:  Appropriate, appreciative  Patient/Family's Understanding of and Emotional Response to Diagnosis, Current Treatment, and Prognosis:  Pt and daughter demonstrate adequate understanding, asked pertinent questions and were clearly engaged in promoting positive outcome.   Emotional Assessment Appearance:   Appears stated age Attitude/Demeanor/Rapport:   (appropriate but drowsy) Affect (typically observed):  Calm Orientation:  Oriented to Self, Oriented to Place, Oriented to Situation Alcohol / Substance use:  Not Applicable Psych involvement (Current and /or in the community):  No (Comment)  Discharge Needs  Concerns to be addressed:  Discharge Planning Concerns Readmission within the last 30 days:  No Current discharge risk:  Dependent with Mobility Barriers to Discharge:  Continued Medical Work up   [employer], LCSW 09/12/2016, 7:08 PM [phone]

## 2016-09-12 NOTE — Progress Notes (Addendum)
PROGRESS NOTE    Amanda [name]  ZOX:[id number] DOB: 11/18/1922 DOA: 09/09/2016 PCP: Barbie BannerWilson, Fred H, MD    Brief Narrative: Amanda ArnHallie B Beranek is a 83 y.o. female with medical history significant for dementia with behavioral disturbance, chronic atrial fibrillation on warfarin, hypothyroidism, and hypertension, now presenting to the emergency department for evaluation of abdominal pain, back pain, nausea, and nonbloody nonbilious vomiting. Patient is accompanied by her daughter who assists with the history. These symptoms have reportedly been intermittent for the past month or more, but particularly severe and worsening for the past 3-4 days. Patient describes her abdominal pain as severe, crampy in character, generalized, nonradiating, constant, worse with movement, and associated with nausea and vomiting. She denies diarrhea, fevers, or chills. She also denies dysuria or flank pain. Patient's other concern is severe pain in the mid back, described as nonradiating, sharp, constant, and much worse with any movement. She reports being unable to get up from bed due to these symptoms. She reports that the symptoms are progressively worsening  Assessment & Plan:   Principal Problem:   Intractable back pain Active Problems:   Hypothyroidism   Essential hypertension   Atrial fibrillation, persistent (HCC)   Colitis   Thoracic compression fracture, closed, initial encounter (HCC)   Diverticulitis   Pressure injury of skin  1-Compression fracture with intractable pain   IV fentanyl prn. Required a dose today. Tramadol not enough for pain management.  She is having a lot pain today. Will start fentanyl patch.  Didn't tolerated Vicodin. Continue with schedule tylenol, and PRN tramadol.  Pain management, would not proceed with kyphoplasty in setting of infection.  Patient has decline bracer in the past  PT evaluation. Needs SNF  2-Diverticulitis vs Epiploic  appendagitis;  Continue with IV  antibiotics. Change antibiotics to Unasyn to decrease risk for interaction with coumadin Advance diet to full liquid. Patient denies abdominal pain,.  Continue with IV antibiotics.   3-HTN; continue with metorpolol   A fib;  Continue warfarin, Lopressor   Dementia  - Continue Seroquel for associated agitation  -more confuse than usual. Suspect related to pain medication.   DVT prophylaxis: coumadin  Code Status: Full code.  Family Communication: care discussed with daughter  Disposition Plan: remain in the hospital    Consultants:   none   Procedures: none   Antimicrobials: cipro, flagyl.    Subjective: Was having a lot pain this morning, received a dose of fentanyl IV Tolerating clear, willing to try full liquid diet.   Objective: Vitals:   09/11/16 0540 09/11/16 1348 09/11/16 2247 09/12/16 0553  BP: 115/82 111/67 123/82 128/75  Pulse: 73 65 87 68  Resp: 14 16 16 16   Temp: 97.8 F (36.6 C) 97.8 F (36.6 C) 97.7 F (36.5 C) 97.8 F (36.6 C)  TempSrc: Oral Oral Oral Oral  SpO2: 99% 99% 96% 100%  Weight:      Height:        Intake/Output Summary (Last 24 hours) at 09/12/16 1002 Last data filed at 09/12/16 0800  Gross per 24 hour  Intake             1540 ml  Output              425 ml  Net             1115 ml   Filed Weights   09/10/16 0039  Weight: 52.3 kg (115 lb 4.8 oz)    Examination:  General exam: NAD  Respiratory system: CTA Cardiovascular system: S 1, S 2 RRR Gastrointestinal system: BS present, soft, nt Central nervous system: alert confuse.  Extremities: symmetric power.  Skin: chronic black scar right hip. No significant redness.      Data Reviewed: I have personally reviewed following labs and imaging studies  CBC:  Recent Labs Lab 09/09/16 1623 09/10/16 0347 09/11/16 0735 09/12/16 0819  WBC 8.5 8.7 10.9* 10.3  HGB 13.6 12.1 11.7* 12.2  HCT 41.3 36.9 35.8* 36.7  MCV 94.9 94.4 94.2 95.1  PLT 276 248 239 234   Basic  Metabolic Panel:  Recent Labs Lab 09/09/16 1623 09/10/16 0347 09/11/16 0735 09/12/16 0819  NA 139 134* 135 135  K 3.7 3.7 3.3* 3.5  CL 102 101 103 105  CO2 28 28 26 25   GLUCOSE 118* 103* 101* 105*  BUN 17 13 9 7   CREATININE 1.02* 0.93 0.93 0.82  CALCIUM 9.4 8.3* 8.0* 7.8*   GFR: Estimated Creatinine Clearance: 35.3 mL/min (by C-G formula based on SCr of 0.82 mg/dL). Liver Function Tests:  Recent Labs Lab 09/09/16 1623  AST 40  ALT 20  ALKPHOS 78  BILITOT 0.9  PROT 7.3  ALBUMIN 3.6    Recent Labs Lab 09/09/16 1623  LIPASE 18   No results for input(s): AMMONIA in the last 168 hours. Coagulation Profile:  Recent Labs Lab 09/09/16 1623 09/10/16 0347 09/11/16 0422 09/12/16 0407  INR 2.15 2.26 2.23 3.05   Cardiac Enzymes: No results for input(s): CKTOTAL, CKMB, CKMBINDEX, TROPONINI in the last 168 hours. BNP (last 3 results) No results for input(s): PROBNP in the last 8760 hours. HbA1C: No results for input(s): HGBA1C in the last 72 hours. CBG: No results for input(s): GLUCAP in the last 168 hours. Lipid Profile: No results for input(s): CHOL, HDL, LDLCALC, TRIG, CHOLHDL, LDLDIRECT in the last 72 hours. Thyroid Function Tests: No results for input(s): TSH, T4TOTAL, FREET4, T3FREE, THYROIDAB in the last 72 hours. Anemia Panel: No results for input(s): VITAMINB12, FOLATE, FERRITIN, TIBC, IRON, RETICCTPCT in the last 72 hours. Sepsis Labs: No results for input(s): PROCALCITON, LATICACIDVEN in the last 168 hours.  No results found for this or any previous visit (from the past 240 hour(s)).       Radiology Studies: Dg Hip Unilat With Pelvis 2-3 Views Right  Result Date: 09/10/2016 CLINICAL DATA:  Lateral right hip pain today. EXAM: DG HIP (WITH OR WITHOUT PELVIS) 2-3V RIGHT COMPARISON:  04/23/2014 FINDINGS: Visualization of the pelvis is limited by overlapping bowel gas. Bipolar right hip hemiarthroplasty is located. No periprosthetic fracture. No  evidence of pelvic ring or contralateral hip fracture. Lumbar disc and facet degeneration. Remote L4 compression fracture with asymmetric left height loss. IMPRESSION: No acute finding. Electronically Signed   By: Marnee Spring M.D.   On: 09/10/2016 17:30        Scheduled Meds: . acetaminophen  650 mg Oral TID PC  . docusate sodium  200 mg Oral Daily  . levothyroxine  75 mcg Oral QAC breakfast  . metoprolol tartrate  25 mg Oral BID  . QUEtiapine  25 mg Oral BID  . sodium chloride flush  3 mL Intravenous Q12H  . Warfarin - Pharmacist Dosing Inpatient   Does not apply q1800   Continuous Infusions: . sodium chloride 250 mL (09/10/16 0605)  . ciprofloxacin Stopped (09/12/16 0012)  . dextrose 5 % and 0.9% NaCl 75 mL/hr at 09/11/16 1611  . metronidazole Stopped (09/12/16 8119)     LOS: 2 days  Time spent: 35 minutes.     Alba Cory, MD [facility] Pager [phone]  If 7PM-7AM, please contact night-coverage [email or web address] Password [email or web address] 09/12/2016, 10:02 AM

## 2016-09-12 NOTE — Progress Notes (Signed)
ANTICOAGULATION CONSULT NOTE - Follow Up Consult  Pharmacy Consult for warfarin Indication: atrial fibrillation  Allergies  Allergen Reactions  . Alendronate Other (See Comments)    GI Upset (intolerance) At risk for GI perforation  . Aspirin Diarrhea and Other (See Comments)    Melena, black tarry stools  . Aspirin-Acetaminophen-Caffeine Other (See Comments)    Melena, Dark stool  . Nsaids Other (See Comments)    Patient has ulcer   . Sulfamethoxazole Hives  . Sulfonamide Derivatives Hives  . Cephalexin Rash  . Codeine Nausea And Vomiting    GI upset    Patient Measurements: Height: 5\' 2"  (157.5 cm) Weight: 115 lb 4.8 oz (52.3 kg) IBW/kg (Calculated) : 50.1  Vital Signs: Temp: 97.8 F (36.6 C) (07/08 0553) Temp Source: Oral (07/08 0553) BP: 128/75 (07/08 0553) Pulse Rate: 68 (07/08 0553)  Labs:  Recent Labs  09/10/16 0347 09/11/16 0422 09/11/16 0735 09/12/16 0407 09/12/16 0819  HGB 12.1  --  11.7*  --  12.2  HCT 36.9  --  35.8*  --  36.7  PLT 248  --  239  --  234  LABPROT 25.4* 25.1*  --  32.2*  --   INR 2.26 2.23  --  3.05  --   CREATININE 0.93  --  0.93  --  0.82    Estimated Creatinine Clearance: 35.3 mL/min (by C-G formula based on SCr of 0.82 mg/dL).  Assessment: 89 yoF on warfarin for atrial fibrillation admitted with compression fracture and abdominal pain. PTA warfarin dose reported as 2.5 mg daily.    Today, 09/12/2016: - INR SUPRAtherapeutic this morning. Warfarin resumed on 7/6.  Last warfarin dose prior to admission was 7/4 1900. Pharmacy ordered 2.5 mg early 7/6am for missed dose 7/5 but dose was refused. - Hgb, platelets WNL - Drug interactions: Cipro and Flagyl started, may need reduced warfarin dose. H/o rash to cephalexin but received cefazolin on 3 separate occassions  Goal of Therapy:  INR 2-3   Plan:   Hold warfarin for rate of INR rise  Drug interactions and acute illness likely contributing to effect on INR   Daily  INR  Suggest change to IV unasyn 3gm IV q8h or augmentin PO to avoid interaction - patient has been given cefazolin in past and given augmentin upon presentation this admission  Juliette Alcideustin Dolorez Jeffrey, PharmD, BCPS.   Pager: [phone] 09/12/2016 12:13 PM

## 2016-09-12 NOTE — NC FL2 (Signed)
[place] MEDICAID FL2 LEVEL OF CARE SCREENING TOOL     IDENTIFICATION  Patient Name: Amanda [name] Birthdate: 09/12/1922 Sex: female Admission Date (Current Location): 09/09/2016  [facility] [place]   [facility] Address[facility]  [facility],  [address], [place] [id number]      Provider Number: [id number]  Attending Physician Name and Address:  Alba Coryegalado, Belkys A, MD  Relative Name and Phone Number:       Current Level of Care: Hospital Recommended Level of Care: Skilled Nursing Facility Prior Approval Number:    Date Approved/Denied:   PASRR Number: [id number]  Discharge Plan: SNF    Current Diagnoses: Patient Active Problem List   Diagnosis Date Noted  . Pressure injury of skin 09/10/2016  . Diverticulitis   . Colitis 09/09/2016  . Thoracic compression fracture, closed, initial encounter (HCC) 09/09/2016  . Right inguinal hernia 05/14/2015  . Atrial fibrillation, persistent (HCC) 02/21/2015  . Femoral neck fracture (HCC) 04/23/2014  . Hip fracture due to osteoporosis (HCC) 04/23/2014  . Murel Shenberger term current use of anticoagulant therapy 10/07/2010  . Essential hypertension 07/21/2009  . CHEST PAIN 07/11/2009  . Hypothyroidism 07/03/2009  . Hyperlipidemia 07/03/2009  . GERD 07/03/2009  . OSTEOARTHRITIS, KNEE 07/03/2009  . Intractable back pain 07/03/2009  . Osteoporosis 07/03/2009    Orientation RESPIRATION BLADDER Height & Weight     Self, Situation, Place  Normal Continent Weight: 115 lb 4.8 oz (52.3 kg) Height:  5\' 2"  (157.5 cm)  BEHAVIORAL SYMPTOMS/MOOD NEUROLOGICAL BOWEL NUTRITION STATUS        Diet (Full liquid)  AMBULATORY STATUS COMMUNICATION OF NEEDS Skin   Total Care Verbally Other (Comment)       Pressure Injury 09/10/16 Unstageable - Full thickness tissue loss in which the base of the ulcer is covered by slough (yellow, tan, gray, green or brown) and/or eschar (tan, brown or black) in the wound bed. necrotic hip  wound, complete coverage with bla  Location: Hip Location Orientation: Right Staging: Unstageable - Full thickness tissue loss in which the base of the ulcer is covered by slough (yellow, tan, gray, green or brown) and/or escha...    PRN Foam dressing                   Personal Care Assistance Level of Assistance  Bathing, Feeding, Dressing Bathing Assistance: Maximum assistance Feeding assistance: Independent Dressing Assistance: Maximum assistance     Functional Limitations Info             SPECIAL CARE FACTORS FREQUENCY  PT (By licensed PT), OT (By licensed OT)     PT Frequency: 5x OT Frequency: 5x            Contractures Contractures Info: Not present    Additional Factors Info  Code Status, Allergies Code Status Info: Full Code Allergies Info: Sulfamethoxazole,Sulfonamide Derivatives,Cephalexin,Alendronate,Aspirin,Aspirin-acetaminophen-caffeine,Nsaids,Codeine           Current Medications (09/12/2016):  This is the current hospital active medication list Current Facility-Administered Medications  Medication Dose Route Frequency Provider Last Rate Last Dose  . 0.9 %  sodium chloride infusion  250 mL Intravenous PRN Briscoe Deutscherpyd, Timothy S, MD 10 mL/hr at 09/10/16 0605 250 mL at 09/10/16 0605  . acetaminophen (TYLENOL) tablet 650 mg  650 mg Oral Q6H PRN Briscoe Deutscherpyd, Timothy S, MD   650 mg at 09/11/16 2323  . acetaminophen (TYLENOL) tablet 650 mg  650 mg Oral TID PC Regalado, Belkys A, MD   650  mg at 09/12/16 1801  . Ampicillin-Sulbactam (UNASYN) 3 g in sodium chloride 0.9 % 100 mL IVPB  3 g Intravenous Q6H Pham, Anh P, RPH      . bisacodyl (DULCOLAX) EC tablet 5 mg  5 mg Oral Daily PRN Opyd, Lavone Neri, MD      . dextrose 5 %-0.9 % sodium chloride infusion   Intravenous Continuous Regalado, Belkys A, MD 75 mL/hr at 09/11/16 1611    . docusate sodium (COLACE) capsule 200 mg  200 mg Oral Daily Opyd, Lavone Neri, MD   200 mg at 09/12/16 1055  . fentaNYL (DURAGESIC - dosed mcg/hr) 12.5  mcg  12.5 mcg Transdermal Q72H Regalado, Belkys A, MD   12.5 mcg at 09/12/16 1055  . fentaNYL (SUBLIMAZE) injection 12.5 mcg  12.5 mcg Intravenous Q4H PRN Regalado, Belkys A, MD   12.5 mcg at 09/12/16 1607  . hydrALAZINE (APRESOLINE) injection 5-10 mg  5-10 mg Intravenous Q4H PRN Opyd, Lavone Neri, MD      . levothyroxine (SYNTHROID, LEVOTHROID) tablet 75 mcg  75 mcg Oral QAC breakfast Opyd, Lavone Neri, MD   75 mcg at 09/12/16 0750  . metoprolol tartrate (LOPRESSOR) tablet 25 mg  25 mg Oral BID Opyd, Lavone Neri, MD   25 mg at 09/12/16 1055  . ondansetron (ZOFRAN) injection 4 mg  4 mg Intravenous Q6H PRN Opyd, Lavone Neri, MD   4 mg at 09/12/16 1308  . QUEtiapine (SEROQUEL) tablet 25 mg  25 mg Oral BID Opyd, Lavone Neri, MD   25 mg at 09/12/16 1055  . senna-docusate (Senokot-S) tablet 1 tablet  1 tablet Oral QHS PRN Opyd, Lavone Neri, MD      . sodium chloride flush (NS) 0.9 % injection 3 mL  3 mL Intravenous Q12H Opyd, Timothy S, MD      . sodium chloride flush (NS) 0.9 % injection 3 mL  3 mL Intravenous PRN Opyd, Lavone Neri, MD      . traMADol (ULTRAM) tablet 50 mg  50 mg Oral Q6H PRN Regalado, Belkys A, MD   50 mg at 09/12/16 1553  . Warfarin - Pharmacist Dosing Inpatient   Does not apply q1800 Opyd, Lavone Neri, MD   Stopped at 09/12/16 1800     Discharge Medications: Please see discharge summary for a list of discharge medications.  Relevant Imaging Results:  Relevant Lab Results:   Additional Information SS# [id number]  Antionette Poles, LCSW

## 2016-09-12 NOTE — Progress Notes (Signed)
Pharmacy Antibiotic Note  Amanda [name] is a 83 y.o. female on warfarin PTA for hx afib presented to the ED on 09/09/2016 with c/o bilateral foot, chest and abd pain.  She's currently on cipro and flagyl for suspected diverticulitis vs Epiploic  appendagitis.  To change abx to unasyn on 09/12/16 to avoid minimize drug-drug intxns with warfarin.  - afeb, wbc wnl, scr 0.82 (crcl~35)   Plan: - unasyn 3 gm IV q6h  _____________________________  Height: 5\' 2"  (157.5 cm) Weight: 115 lb 4.8 oz (52.3 kg) IBW/kg (Calculated) : 50.1  Temp (24hrs), Avg:97.7 F (36.5 C), Min:97.6 F (36.4 C), Max:97.8 F (36.6 C)   Recent Labs Lab 09/09/16 1623 09/10/16 0347 09/11/16 0735 09/12/16 0819  WBC 8.5 8.7 10.9* 10.3  CREATININE 1.02* 0.93 0.93 0.82    Estimated Creatinine Clearance: 35.3 mL/min (by C-G formula based on SCr of 0.82 mg/dL).    Allergies  Allergen Reactions  . Alendronate Other (See Comments)    GI Upset (intolerance) At risk for GI perforation  . Aspirin Diarrhea and Other (See Comments)    Melena, black tarry stools  . Aspirin-Acetaminophen-Caffeine Other (See Comments)    Melena, Dark stool  . Nsaids Other (See Comments)    Patient has ulcer   . Sulfamethoxazole Hives  . Sulfonamide Derivatives Hives  . Cephalexin Rash  . Codeine Nausea And Vomiting    GI upset     Thank you for allowing pharmacy to be a part of this patient's care.  Lucia Gaskinsham, Nickole Adamek P 09/12/2016 3:37 PM

## 2016-09-13 LAB — PROTIME-INR
INR: 3.61
Prothrombin Time: 36.8 seconds — ABNORMAL HIGH (ref 11.4–15.2)

## 2016-09-13 MED ORDER — FENTANYL 25 MCG/HR TD PT72: 25.0000 ug | MEDICATED_PATCH | TRANSDERMAL | Status: DC

## 2016-09-13 MED ORDER — FENTANYL 25 MCG/HR TD PT72
25.0000 ug | MEDICATED_PATCH | TRANSDERMAL | Status: DC
  Administered 2016-09-13: 25 ug via TRANSDERMAL
  Filled 2016-09-13: qty 1

## 2016-09-13 NOTE — Progress Notes (Signed)
Provided pt and family with list of bed offers- Family will visit facilities and notify CSW of choice.  Will continue following to assist with transfer to SNF at [place].  Ilean SkillMeghan Beula Joyner, MSW, LCSW Clinical Social Work 09/13/2016 [phone]

## 2016-09-13 NOTE — Progress Notes (Signed)
Physical Therapy Treatment Patient Details Name: Amanda [name] MRN: [id number] DOB: May 13, 1922 Today's Date: 09/13/2016    History of Present Illness 83 yo returns to ED due to back pain , abdominal pain, suspected colitis. was at SNF in recent past. Has new T11 compression fracture,  recent  L4 compression fx. PMHx: R THA, Afib, hypothyroidism. Xray negative for hip injury.    PT Comments    The patient is tearful with any attempts to move the trunk. The patient declined to attempts to sit at the bed edge and became more anxious with encouragement. Did assist patient with AAROM both legs.. Attempted repositioning in the bed.   Follow Up Recommendations  SNF;Supervision/Assistance - 24 hour     Equipment Recommendations  None recommended by PT    Recommendations for Other Services       Precautions / Restrictions Precautions Precautions: Fall    Mobility  Bed Mobility               General bed mobility comments: unable to assist patient to sitting due to increased complaints of pain and tearful.  Transfers                    Ambulation/Gait                 Stairs            Wheelchair Mobility    Modified Rankin (Stroke Patients Only)       Balance                                            Cognition Arousal/Alertness: Awake/alert Behavior During Therapy: Anxious (tearful with c/o pain) Overall Cognitive Status: History of cognitive impairments - at baseline                                        Exercises General Exercises - Lower Extremity Ankle Circles/Pumps: AAROM;Both;10 reps Heel Slides: AAROM;Both;10 reps Hip ABduction/ADduction: AAROM;Both;10 reps    General Comments        Pertinent Vitals/Pain Faces Pain Scale: Hurts worst Pain Location: indicates right side above the iliac crest Pain Descriptors / Indicators: Cramping;Discomfort;Moaning;Crying Pain Intervention(s):  Premedicated before session;Repositioned    Home Living                      Prior Function            PT Goals (current goals can now be found in the care plan section) Progress towards PT goals: Not progressing toward goals - comment (complaints of pain )    Frequency    Min 3X/week      PT Plan Current plan remains appropriate    Co-evaluation              AM-PAC PT "6 Clicks" Daily Activity  Outcome Measure  Difficulty turning over in bed (including adjusting bedclothes, sheets and blankets)?: Total Difficulty moving from lying on back to sitting on the side of the bed? : Total Difficulty sitting down on and standing up from a chair with arms (e.g., wheelchair, bedside commode, etc,.)?: Total Help needed moving to and from a bed to chair (including a wheelchair)?: Total Help needed walking in hospital room?: Total Help needed  climbing 3-5 steps with a railing? : Total 6 Click Score: 6    End of Session   Activity Tolerance: Patient limited by pain Patient left: in bed;with call bell/phone within reach;with bed alarm set;with family/visitor present Nurse Communication: Mobility status PT Visit Diagnosis: Difficulty in walking, not elsewhere classified (R26.2);History of falling (Z91.81);Pain Pain - part of body: Hip     Time: [id number] PT Time Calculation (min) (ACUTE ONLY): 31 min  Charges:  $Therapeutic Exercise: 8-22 mins $Therapeutic Activity: 8-22 mins                    G CodesBlanchard Kelch:       Azharia Surratt PT [phone]    Rada HayHill, Almin Livingstone Elizabeth 09/13/2016, 1:20 PM

## 2016-09-13 NOTE — Progress Notes (Signed)
PROGRESS NOTE    Amanda [name]  MVH:[id number] DOB: 12/02/1922 DOA: 09/09/2016 PCP: Barbie Banner, MD    Brief Narrative: Amanda [name] is a 83 y.o. female with medical history significant for dementia with behavioral disturbance, chronic atrial fibrillation on warfarin, hypothyroidism, and hypertension, now presenting to the emergency department for evaluation of abdominal pain, back pain, nausea, and nonbloody nonbilious vomiting. Patient is accompanied by her daughter who assists with the history. These symptoms have reportedly been intermittent for the past month or more, but particularly severe and worsening for the past 3-4 days. Patient describes her abdominal pain as severe, crampy in character, generalized, nonradiating, constant, worse with movement, and associated with nausea and vomiting. She denies diarrhea, fevers, or chills. She also denies dysuria or flank pain. Patient's other concern is severe pain in the mid back, described as nonradiating, sharp, constant, and much worse with any movement. She reports being unable to get up from bed due to these symptoms. She reports that the symptoms are progressively worsening  Assessment & Plan:   Principal Problem:   Intractable back pain Active Problems:   Hypothyroidism   Essential hypertension   Atrial fibrillation, persistent (HCC)   Colitis   Thoracic compression fracture, closed, initial encounter (HCC)   Diverticulitis   Pressure injury of skin  1-Compression fracture with intractable pain  Didn't tolerated Vicodin. Continue with schedule tylenol, and PRN tramadol.  Pain management, would not proceed with kyphoplasty in setting of infection.  Patient has decline bracer in the past  PT evaluation. Needs SNF Will increase fentanyl patch today   2-Diverticulitis vs Epiploic  appendagitis;  Continue with IV antibiotics. Change antibiotics to Unasyn to decrease risk for interaction with coumadin Continue with IV  antibiotics.  Continue with full liquid diet   3-HTN; continue with metorpolol   A fib;  Continue warfarin, Lopressor   Dementia  - Continue Seroquel for associated agitation  -more confuse than usual. Suspect related to pain medication.   DVT prophylaxis: coumadin  Code Status: Full code.  Family Communication: care discussed with daughter  Disposition Plan: remain in the hospital    Consultants:   none   Procedures: none   Antimicrobials: cipro, flagyl.    Subjective: Had increase back pain this am. Required one dose of IV fentanyl.  Mild abdominal pain.   Objective: Vitals:   09/12/16 0553 09/12/16 1501 09/12/16 2030 09/13/16 0640  BP: 128/75 140/68 (!) 158/79 (!) 160/88  Pulse: 68 (!) 53 60 84  Resp: 16 16 20 20   Temp: 97.8 F (36.6 C) 97.6 F (36.4 C) 97.7 F (36.5 C) 97.9 F (36.6 C)  TempSrc: Oral Oral Oral Oral  SpO2: 100% 100% 98% 90%  Weight:      Height:        Intake/Output Summary (Last 24 hours) at 09/13/16 1116 Last data filed at 09/13/16 1036  Gross per 24 hour  Intake           1362.5 ml  Output             1450 ml  Net            -87.5 ml   Filed Weights   09/10/16 0039  Weight: 52.3 kg (115 lb 4.8 oz)    Examination:  General exam: NAD Respiratory system: CTA Cardiovascular system: S 1, S 2 RRR Gastrointestinal system: BS present, soft, nt Central nervous system: Alert,  Extremities: symmetric power.  Skin: chronic black scar right hip.  No significant redness.      Data Reviewed: I have personally reviewed following labs and imaging studies  CBC:  Recent Labs Lab 09/09/16 1623 09/10/16 0347 09/11/16 0735 09/12/16 0819  WBC 8.5 8.7 10.9* 10.3  HGB 13.6 12.1 11.7* 12.2  HCT 41.3 36.9 35.8* 36.7  MCV 94.9 94.4 94.2 95.1  PLT 276 248 239 234   Basic Metabolic Panel:  Recent Labs Lab 09/09/16 1623 09/10/16 0347 09/11/16 0735 09/12/16 0819  NA 139 134* 135 135  K 3.7 3.7 3.3* 3.5  CL 102 101 103 105    CO2 28 28 26 25   GLUCOSE 118* 103* 101* 105*  BUN 17 13 9 7   CREATININE 1.02* 0.93 0.93 0.82  CALCIUM 9.4 8.3* 8.0* 7.8*   GFR: Estimated Creatinine Clearance: 35.3 mL/min (by C-G formula based on SCr of 0.82 mg/dL). Liver Function Tests:  Recent Labs Lab 09/09/16 1623  AST 40  ALT 20  ALKPHOS 78  BILITOT 0.9  PROT 7.3  ALBUMIN 3.6    Recent Labs Lab 09/09/16 1623  LIPASE 18   No results for input(s): AMMONIA in the last 168 hours. Coagulation Profile:  Recent Labs Lab 09/09/16 1623 09/10/16 0347 09/11/16 0422 09/12/16 0407 09/13/16 0353  INR 2.15 2.26 2.23 3.05 3.61   Cardiac Enzymes: No results for input(s): CKTOTAL, CKMB, CKMBINDEX, TROPONINI in the last 168 hours. BNP (last 3 results) No results for input(s): PROBNP in the last 8760 hours. HbA1C: No results for input(s): HGBA1C in the last 72 hours. CBG: No results for input(s): GLUCAP in the last 168 hours. Lipid Profile: No results for input(s): CHOL, HDL, LDLCALC, TRIG, CHOLHDL, LDLDIRECT in the last 72 hours. Thyroid Function Tests: No results for input(s): TSH, T4TOTAL, FREET4, T3FREE, THYROIDAB in the last 72 hours. Anemia Panel: No results for input(s): VITAMINB12, FOLATE, FERRITIN, TIBC, IRON, RETICCTPCT in the last 72 hours. Sepsis Labs: No results for input(s): PROCALCITON, LATICACIDVEN in the last 168 hours.  No results found for this or any previous visit (from the past 240 hour(s)).       Radiology Studies: No results found.      Scheduled Meds: . acetaminophen  650 mg Oral TID PC  . docusate sodium  200 mg Oral Daily  . fentaNYL  25 mcg Transdermal Q72H  . levothyroxine  75 mcg Oral QAC breakfast  . metoprolol tartrate  25 mg Oral BID  . QUEtiapine  25 mg Oral BID  . sodium chloride flush  3 mL Intravenous Q12H  . Warfarin - Pharmacist Dosing Inpatient   Does not apply q1800   Continuous Infusions: . sodium chloride 250 mL (09/10/16 0605)  . ampicillin-sulbactam  (UNASYN) IV Stopped (09/13/16 0536)  . dextrose 5 % and 0.9% NaCl 75 mL/hr at 09/11/16 1611     LOS: 3 days    Time spent: 35 minutes.     Alba Coryegalado, Kirah Stice A, MD [facility] Pager [phone]  If 7PM-7AM, please contact night-coverage [email or web address] Password [email or web address] 09/13/2016, 11:16 AM

## 2016-09-13 NOTE — Progress Notes (Signed)
ANTICOAGULATION CONSULT NOTE - Follow Up Consult  Pharmacy Consult for warfarin Indication: atrial fibrillation  Allergies  Allergen Reactions  . Alendronate Other (See Comments)    GI Upset (intolerance) At risk for GI perforation  . Aspirin Diarrhea and Other (See Comments)    Melena, black tarry stools  . Aspirin-Acetaminophen-Caffeine Other (See Comments)    Melena, Dark stool  . Nsaids Other (See Comments)    Patient has ulcer   . Sulfamethoxazole Hives  . Sulfonamide Derivatives Hives  . Cephalexin Rash  . Codeine Nausea And Vomiting    GI upset    Patient Measurements: Height: 5\' 2"  (157.5 cm) Weight: 115 lb 4.8 oz (52.3 kg) IBW/kg (Calculated) : 50.1  Vital Signs: Temp: 97.9 F (36.6 C) (07/09 0640) Temp Source: Oral (07/09 0640) BP: 160/88 (07/09 0640) Pulse Rate: 84 (07/09 0640)  Labs:  Recent Labs  09/11/16 0422 09/11/16 0735 09/12/16 0407 09/12/16 0819 09/13/16 0353  HGB  --  11.7*  --  12.2  --   HCT  --  35.8*  --  36.7  --   PLT  --  239  --  234  --   LABPROT 25.1*  --  32.2*  --  36.8*  INR 2.23  --  3.05  --  3.61  CREATININE  --  0.93  --  0.82  --     Estimated Creatinine Clearance: 35.3 mL/min (by C-G formula based on SCr of 0.82 mg/dL).  Assessment: 89 yoF on warfarin for atrial fibrillation admitted with compression fracture and abdominal pain. PTA warfarin dose reported as 2.5 mg daily.    Today, 09/13/2016:  INR 3.61, SUPRAtherapeutic and increased despite holding warfarin yesterday  Last warfarin dose prior to admission was 7/4 1900. Warfarin resumed on 7/6 and Pharmacy ordered 2.5 mg early 7/6am for missed dose 7/5 but dose was refused.  Doses given on 7/6 and 7/7.  Hgb, platelets WNL (last on 7/8)  Diet: Full liquids  Drug interactions: Cipro and Flagyl given 7/5-7/8, now changed to Unasyn to decrease interaction with warfarin.  Goal of Therapy:  INR 2-3   Plan:   Hold warfarin today  Drug interactions and acute  illness likely contributing to effect on INR   Daily INR  Monitor CBC and for s/s bleeding or thrombosis.  Lynann Beaverhristine Adel Burch PharmD, BCPS Pager [phone] 09/13/2016 10:34 AM

## 2016-09-14 ENCOUNTER — Inpatient Hospital Stay (HOSPITAL_COMMUNITY): Payer: Medicare Other

## 2016-09-14 LAB — CBC
HEMATOCRIT: 40.4 % (ref 36.0–46.0)
HEMOGLOBIN: 13.3 g/dL (ref 12.0–15.0)
MCH: 31.1 pg (ref 26.0–34.0)
MCHC: 32.9 g/dL (ref 30.0–36.0)
MCV: 94.4 fL (ref 78.0–100.0)
Platelets: 254 10*3/uL (ref 150–400)
RBC: 4.28 MIL/uL (ref 3.87–5.11)
RDW: 16.2 % — ABNORMAL HIGH (ref 11.5–15.5)
WBC: 10.7 10*3/uL — ABNORMAL HIGH (ref 4.0–10.5)

## 2016-09-14 LAB — BASIC METABOLIC PANEL
ANION GAP: 8 (ref 5–15)
BUN: 5 mg/dL — AB (ref 6–20)
CHLORIDE: 102 mmol/L (ref 101–111)
CO2: 27 mmol/L (ref 22–32)
Calcium: 8.3 mg/dL — ABNORMAL LOW (ref 8.9–10.3)
Creatinine, Ser: 0.83 mg/dL (ref 0.44–1.00)
GFR calc Af Amer: >60 mL/min (ref >60)
GFR calc non Af Amer: 60 mL/min — ABNORMAL LOW (ref >60)
GLUCOSE: 96 mg/dL (ref 65–99)
POTASSIUM: 3.5 mmol/L (ref 3.5–5.1)
Sodium: 137 mmol/L (ref 135–145)

## 2016-09-14 LAB — PROTIME-INR
INR: 3.13
Prothrombin Time: 32.9 seconds — ABNORMAL HIGH (ref 11.4–15.2)

## 2016-09-14 MED ORDER — IOPAMIDOL (ISOVUE-300) INJECTION 61%
15.0000 mL | Freq: Two times a day (BID) | INTRAVENOUS | Status: DC | PRN
  Administered 2016-09-14: 15 mL via ORAL
  Filled 2016-09-14: qty 30

## 2016-09-14 MED ORDER — IOPAMIDOL (ISOVUE-300) INJECTION 61%
INTRAVENOUS | Status: AC
  Administered 2016-09-14: 15 mL
  Filled 2016-09-14: qty 30

## 2016-09-14 MED ORDER — IOPAMIDOL (ISOVUE-300) INJECTION 61%
100.0000 mL | Freq: Once | INTRAVENOUS | Status: AC | PRN
  Administered 2016-09-14: 100 mL via INTRAVENOUS

## 2016-09-14 MED ORDER — COLLAGENASE 250 UNIT/GM EX OINT
TOPICAL_OINTMENT | Freq: Every day | CUTANEOUS | Status: DC
  Administered 2016-09-14: 1 via TOPICAL
  Administered 2016-09-15: 10:00:00 via TOPICAL
  Filled 2016-09-14: qty 90
  Filled 2016-09-14: qty 30

## 2016-09-14 MED ORDER — IOPAMIDOL (ISOVUE-300) INJECTION 61%
INTRAVENOUS | Status: AC
  Filled 2016-09-14: qty 100

## 2016-09-14 NOTE — Consult Note (Addendum)
WOC Nurse wound consult note Reason for Consult: Consult requested for right hip wound Wound type: Unstageable pressure injury Pressure Injury POA: Yes Measurement: 4X1.5cm Wound bed: 100% tightly adhered eschar Drainage (amount, consistency, odor) No odor or fluctuance, minimal amt tan drainage Periwound: intact skin surrounding Dressing procedure/placement/frequency: Santyl ointment to provide enzymatic debridement of nonviable tissue.  Discussed plan of care with patient and family members at the bedside. Please re-consult if further assistance is needed.  Thank-you,  Cammie Mcgeeawn Khaliel Morey MSN, RN, CWOCN, ClarenceWCN-AP, CNS [phone]

## 2016-09-14 NOTE — Progress Notes (Signed)
Pt and family still deliberating for SNF choice. Family's first choice is [facility] due to proximity to their home. CSW reached out to [facility] (stated they declined due to no bed availability till later this week) and will refer again on day of DC, however CSW encouraged family to have another choice in case [facility] does not have availability.  Will follow and assist with transfer to SNF at DC.  Ilean SkillMeghan Kamonte Mcmichen, MSW, LCSW Clinical Social Work 09/14/2016 [phone]

## 2016-09-14 NOTE — Progress Notes (Signed)
ANTICOAGULATION CONSULT NOTE - Follow Up Consult  Pharmacy Consult for warfarin Indication: atrial fibrillation  Allergies  Allergen Reactions  . Alendronate Other (See Comments)    GI Upset (intolerance) At risk for GI perforation  . Aspirin Diarrhea and Other (See Comments)    Melena, black tarry stools  . Aspirin-Acetaminophen-Caffeine Other (See Comments)    Melena, Dark stool  . Nsaids Other (See Comments)    Patient has ulcer   . Sulfamethoxazole Hives  . Sulfonamide Derivatives Hives  . Cephalexin Rash  . Codeine Nausea And Vomiting    GI upset    Patient Measurements: Height: 5\' 2"  (157.5 cm) Weight: 115 lb 4.8 oz (52.3 kg) IBW/kg (Calculated) : 50.1  Vital Signs: Temp: 98.4 F (36.9 C) (07/10 0631) Temp Source: Oral (07/10 0631) BP: 141/89 (07/10 0631) Pulse Rate: 66 (07/10 0631)  Labs:  Recent Labs  09/12/16 0407 09/12/16 0819 09/13/16 0353 09/14/16 0415  HGB  --  12.2  --   --   HCT  --  36.7  --   --   PLT  --  234  --   --   LABPROT 32.2*  --  36.8* 32.9*  INR 3.05  --  3.61 3.13  CREATININE  --  0.82  --   --     Estimated Creatinine Clearance: 35.3 mL/min (by C-G formula based on SCr of 0.82 mg/dL).  Assessment: 89 yoF on warfarin for atrial fibrillation admitted with compression fracture and abdominal pain. PTA warfarin dose reported as 2.5 mg daily.    Today, 09/14/2016:  INR 3.13, SUPRAtherapeutic but now decreased after holding warfarin x2 days  Last warfarin dose prior to admission was 7/4 1900. Warfarin resumed on 7/6 and Pharmacy ordered 2.5 mg early 7/6am for missed dose 7/5 but dose was refused.  Doses given on 7/6 and 7/7.  CBC:  Hgb, platelets WNL   No bleeding or complications reported  Diet: Full liquids  Drug interactions: Cipro and Flagyl given 7/5-7/8, now changed to Unasyn to decrease interaction with warfarin.  Goal of Therapy:  INR 2-3   Plan:   Hold warfarin today  Drug interactions and acute illness likely  contributing to effect on INR   Daily INR  Monitor CBC and for s/s bleeding or thrombosis.  Lynann Beaverhristine Kamyiah Colantonio PharmD, BCPS Pager [phone] 09/14/2016 8:19 AM

## 2016-09-14 NOTE — Progress Notes (Signed)
Vomited x 1,greenish emesis,approximately 100 cc. Zofran 4 mg IV administered.

## 2016-09-14 NOTE — Progress Notes (Signed)
Physical Therapy Treatment Patient Details Name: Amanda [name] MRN: [id number] DOB: 06/19/1922 Today's Date: 09/14/2016    History of Present Illness 83 yo returns to ED due to back pain , abdominal pain, suspected colitis. was at SNF in recent past. Has new T11 compression fracture,  recent  L4 compression fx. PMHx: R THA, Afib, hypothyroidism. Xray negative for hip injury.    PT Comments    Pt in bed with son and spouse in room.  Pt lives with her spouse and son who care for both.   Pt pre medicated for pain prior to session.  Assisted OOB to [facility] only due to pain level/anxiety and fear of falling.  Pt crying in pain and required extra comfort and emotional support. Unable to functionally take any steps, assisted back to bed and positioned to comfort.  Reported to NT pt voided in [facility].    Follow Up Recommendations  SNF     Equipment Recommendations  None recommended by PT    Recommendations for Other Services       Precautions / Restrictions Precautions Precautions: Fall Precaution Comments: T11 comp Fx Restrictions Weight Bearing Restrictions: No    Mobility  Bed Mobility Overal bed mobility: Needs Assistance Bed Mobility: Supine to Sit;Sit to Supine     Supine to sit: Max assist;+2 for physical assistance;+2 for safety/equipment Sit to supine: Total assist;+2 for physical assistance;+2 for safety/equipment   General bed mobility comments: assisted to EOB and back to bed + 2 assist  Transfers   Equipment used: None             General transfer comment: 1/4 pivot turn from elevated bed to [facility] with 75% VC's on proper hand placement andf 100% VC's assist with turn completion.  Poor kyphotic posture and diverted cognition due to pain/anxiety/fear.  Also assisted back to bed.    Ambulation/Gait             General Gait Details: unable to attempt due to pain level/high anxiety and fear of falling   Stairs            Wheelchair Mobility    Modified  Rankin (Stroke Patients Only)       Balance                                            Cognition Arousal/Alertness: Awake/alert Behavior During Therapy: Anxious Overall Cognitive Status: History of cognitive impairments - at baseline                                 General Comments: high anxiety/fearful      Exercises      General Comments        Pertinent Vitals/Pain Pain Assessment: Faces Faces Pain Scale: Hurts whole lot Pain Location: mid back all around to ABD Pain Descriptors / Indicators: Cramping;Discomfort;Moaning;Crying Pain Intervention(s): Monitored during session;Repositioned    Home Living                      Prior Function            PT Goals (current goals can now be found in the care plan section) Progress towards PT goals: Progressing toward goals    Frequency    Min 3X/week      PT  Plan Current plan remains appropriate    Co-evaluation              AM-PAC PT "6 Clicks" Daily Activity  Outcome Measure  Difficulty turning over in bed (including adjusting bedclothes, sheets and blankets)?: Total Difficulty moving from lying on back to sitting on the side of the bed? : Total Difficulty sitting down on and standing up from a chair with arms (e.g., wheelchair, bedside commode, etc,.)?: Total Help needed moving to and from a bed to chair (including a wheelchair)?: Total Help needed walking in hospital room?: Total Help needed climbing 3-5 steps with a railing? : Total 6 Click Score: 6    End of Session Equipment Utilized During Treatment: Gait belt Activity Tolerance: Patient limited by pain;Other (comment) (anxiety/fear) Patient left: in bed;with call bell/phone within reach;with bed alarm set;with family/visitor present Nurse Communication: Mobility status PT Visit Diagnosis: Difficulty in walking, not elsewhere classified (R26.2);History of falling (Z91.81);Pain     Time:  1111-1130 PT Time Calculation (min) (ACUTE ONLY): 19 min  Charges:  $Therapeutic Activity: 8-22 mins                    G Codes:       {Martinez Boxx  PTA [facility]  Acute  Rehab Pager      [phone]

## 2016-09-14 NOTE — Progress Notes (Signed)
PROGRESS NOTE    Amanda [name]  ZOX:[id number] DOB: 12/02/1922 DOA: 09/09/2016 PCP: Barbie BannerWilson, Fred H, MD    Brief Narrative: Amanda ArnHallie B Kell is a 83 y.o. female with medical history significant for dementia with behavioral disturbance, chronic atrial fibrillation on warfarin, hypothyroidism, and hypertension, now presenting to the emergency department for evaluation of abdominal pain, back pain, nausea, and nonbloody nonbilious vomiting. Patient is accompanied by her daughter who assists with the history. These symptoms have reportedly been intermittent for the past month or more, but particularly severe and worsening for the past 3-4 days. Patient describes her abdominal pain as severe, crampy in character, generalized, nonradiating, constant, worse with movement, and associated with nausea and vomiting. She denies diarrhea, fevers, or chills. She also denies dysuria or flank pain. Patient's other concern is severe pain in the mid back, described as nonradiating, sharp, constant, and much worse with any movement. She reports being unable to get up from bed due to these symptoms. She reports that the symptoms are progressively worsening  Assessment & Plan:   Principal Problem:   Intractable back pain Active Problems:   Hypothyroidism   Essential hypertension   Atrial fibrillation, persistent (HCC)   Colitis   Thoracic compression fracture, closed, initial encounter (HCC)   Diverticulitis   Pressure injury of skin  1-Compression fracture with intractable pain  Didn't tolerated Vicodin. Continue with schedule tylenol, and PRN tramadol.  Pain management, would not proceed with kyphoplasty in setting of infection.  Patient has decline bracer in the past  PT evaluation. Needs SNF Continue with fentanyl Patch at 25 mcg   2-Diverticulitis vs Epiploic  appendagitis;  Continue with IV antibiotics. Change antibiotics to Unasyn to decrease risk for interaction with coumadin Continue with IV  antibiotics.  Continue with full liquid diet  Complaining of abdominal pain, not better, worse. Will repeat  CT abdomen   3-HTN; continue with metorpolol   Right hip pain;  Skin with necrotic area. X ray negative for fracture.  Pain persist, not better. Will consult ortho/.   A fib;  Continue warfarin, Lopressor   Dementia  - Continue Seroquel for associated agitation  -more confuse than usual. Suspect related to pain medication.   DVT prophylaxis: coumadin  Code Status: Full code.  Family Communication: care discussed with daughter  Disposition Plan: remain in the hospital    Consultants:   none   Procedures: none   Antimicrobials: cipro, flagyl. ---Unasyn.    Subjective: Very uncomfortable. Complaining of RUQ pain, Also right hip pain/    Objective: Vitals:   09/13/16 0640 09/13/16 1446 09/14/16 0631 09/14/16 1000  BP: (!) 160/88 (!) 144/83 (!) 141/89 (!) 147/117  Pulse: 84 64 66 72  Resp: 20 20 18    Temp: 97.9 F (36.6 C) 98.1 F (36.7 C) 98.4 F (36.9 C)   TempSrc: Oral Oral Oral   SpO2: 90% 99% 99%   Weight:      Height:        Intake/Output Summary (Last 24 hours) at 09/14/16 1142 Last data filed at 09/14/16 0635  Gross per 24 hour  Intake                0 ml  Output              375 ml  Net             -375 ml   Filed Weights   09/10/16 0039  Weight: 52.3 kg (115 lb 4.8 oz)  Examination:  General exam: NAD Respiratory system: CTA Cardiovascular system: S 1, S 2 RRR Gastrointestinal system: Bs present, soft, mild RUQ pain tenderness.  Central nervous system: Alert,  Extremities: symmetric power.  Skin: chronic black scar right hip. No significant redness.      Data Reviewed: I have personally reviewed following labs and imaging studies  CBC:  Recent Labs Lab 09/09/16 1623 09/10/16 0347 09/11/16 0735 09/12/16 0819 09/14/16 0822  WBC 8.5 8.7 10.9* 10.3 10.7*  HGB 13.6 12.1 11.7* 12.2 13.3  HCT 41.3 36.9 35.8* 36.7 40.4   MCV 94.9 94.4 94.2 95.1 94.4  PLT 276 248 239 234 254   Basic Metabolic Panel:  Recent Labs Lab 09/09/16 1623 09/10/16 0347 09/11/16 0735 09/12/16 0819 09/14/16 0822  NA 139 134* 135 135 137  K 3.7 3.7 3.3* 3.5 3.5  CL 102 101 103 105 102  CO2 28 28 26 25 27   GLUCOSE 118* 103* 101* 105* 96  BUN 17 13 9 7  5*  CREATININE 1.02* 0.93 0.93 0.82 0.83  CALCIUM 9.4 8.3* 8.0* 7.8* 8.3*   GFR: Estimated Creatinine Clearance: 34.9 mL/min (by C-G formula based on SCr of 0.83 mg/dL). Liver Function Tests:  Recent Labs Lab 09/09/16 1623  AST 40  ALT 20  ALKPHOS 78  BILITOT 0.9  PROT 7.3  ALBUMIN 3.6    Recent Labs Lab 09/09/16 1623  LIPASE 18   No results for input(s): AMMONIA in the last 168 hours. Coagulation Profile:  Recent Labs Lab 09/10/16 0347 09/11/16 0422 09/12/16 0407 09/13/16 0353 09/14/16 0415  INR 2.26 2.23 3.05 3.61 3.13   Cardiac Enzymes: No results for input(s): CKTOTAL, CKMB, CKMBINDEX, TROPONINI in the last 168 hours. BNP (last 3 results) No results for input(s): PROBNP in the last 8760 hours. HbA1C: No results for input(s): HGBA1C in the last 72 hours. CBG: No results for input(s): GLUCAP in the last 168 hours. Lipid Profile: No results for input(s): CHOL, HDL, LDLCALC, TRIG, CHOLHDL, LDLDIRECT in the last 72 hours. Thyroid Function Tests: No results for input(s): TSH, T4TOTAL, FREET4, T3FREE, THYROIDAB in the last 72 hours. Anemia Panel: No results for input(s): VITAMINB12, FOLATE, FERRITIN, TIBC, IRON, RETICCTPCT in the last 72 hours. Sepsis Labs: No results for input(s): PROCALCITON, LATICACIDVEN in the last 168 hours.  No results found for this or any previous visit (from the past 240 hour(s)).       Radiology Studies: No results found.      Scheduled Meds: . acetaminophen  650 mg Oral TID PC  . docusate sodium  200 mg Oral Daily  . fentaNYL  25 mcg Transdermal Q72H  . levothyroxine  75 mcg Oral QAC breakfast  .  metoprolol tartrate  25 mg Oral BID  . QUEtiapine  25 mg Oral BID  . sodium chloride flush  3 mL Intravenous Q12H  . Warfarin - Pharmacist Dosing Inpatient   Does not apply q1800   Continuous Infusions: . sodium chloride 250 mL (09/10/16 0605)  . ampicillin-sulbactam (UNASYN) IV 3 g (09/14/16 1045)  . dextrose 5 % and 0.9% NaCl 75 mL/hr at 09/11/16 1611     LOS: 4 days    Time spent: 35 minutes.     Alba Cory, MD [facility] Pager [phone]  If 7PM-7AM, please contact night-coverage [email or web address] Password [email or web address] 09/14/2016, 11:42 AM

## 2016-09-14 NOTE — Care Management Important Message (Signed)
Important Message  Patient Details  Name: Amanda [name] MRN: [id number] Date of Birth: 06/03/1922   Medicare Important Message Given:  Yes    Caren MacadamFuller, Naly Schwanz 09/14/2016, 9:54 AMImportant Message  Patient Details  Name: Amanda [name] MRN: [id number] Date of Birth: 11/29/1922   Medicare Important Message Given:  Yes    Caren MacadamFuller, Chevon Laufer 09/14/2016, 9:54 AM

## 2016-09-14 NOTE — Progress Notes (Signed)
Patient ID: Amanda [name], female   DOB: 12/22/1922, 83 y.o.   MRN: [id number]   LOS: 4 days   Subjective: Family notes sore on right hip that's been present for ~1 month. They believe it happened after her back starting hurting 1-2 months ago and she was lying on that side a lot to help with the pain.    Objective: Vital signs in last 24 hours: Temp:  [98.1 F (36.7 C)-98.4 F (36.9 C)] 98.4 F (36.9 C) (07/10 0631) Pulse Rate:  [64-72] 72 (07/10 1000) Resp:  [18-20] 18 (07/10 0631) BP: (141-147)/(83-117) 147/117 (07/10 1000) SpO2:  [99 %] 99 % (07/10 0631) Last BM Date: 09/12/16   Laboratory  CBC  Recent Labs  09/12/16 0819 09/14/16 0822  WBC 10.3 10.7*  HGB 12.2 13.3  HCT 36.7 40.4  PLT 234 254   BMET  Recent Labs  09/12/16 0819 09/14/16 0822  NA 135 137  K 3.5 3.5  CL 105 102  CO2 25 27  GLUCOSE 105* 96  BUN 7 5*  CREATININE 0.82 0.83  CALCIUM 7.8* 8.3*     Physical Exam General appearance: alert and no distress Incision/Wound:Pressure sore on right hip just posterior to well-healed incision   Assessment/Plan: Right hip pressure sore -- Agree with management by WOC. No surgical indication at this time.    Freeman CaldronMichael J. Teila Skalsky, PA-C Orthopedic Surgery [phone] 09/14/2016

## 2016-09-15 DIAGNOSIS — K5792 Diverticulitis of intestine, part unspecified, without perforation or abscess without bleeding: Secondary | ICD-10-CM | POA: Diagnosis present

## 2016-09-15 DIAGNOSIS — I481 Persistent atrial fibrillation: Secondary | ICD-10-CM

## 2016-09-15 DIAGNOSIS — S71001D Unspecified open wound, right hip, subsequent encounter: Secondary | ICD-10-CM

## 2016-09-15 DIAGNOSIS — S22000A Wedge compression fracture of unspecified thoracic vertebra, initial encounter for closed fracture: Secondary | ICD-10-CM

## 2016-09-15 LAB — BASIC METABOLIC PANEL WITH GFR
Anion gap: 5 (ref 5–15)
BUN: 5 mg/dL — ABNORMAL LOW (ref 6–20)
CO2: 28 mmol/L (ref 22–32)
Calcium: 7.7 mg/dL — ABNORMAL LOW (ref 8.9–10.3)
Chloride: 103 mmol/L (ref 101–111)
Creatinine, Ser: 0.76 mg/dL (ref 0.44–1.00)
GFR calc Af Amer: >60 mL/min
GFR calc non Af Amer: >60 mL/min
Glucose, Bld: 122 mg/dL — ABNORMAL HIGH (ref 65–99)
Potassium: 3.5 mmol/L (ref 3.5–5.1)
Sodium: 136 mmol/L (ref 135–145)

## 2016-09-15 LAB — CBC
HCT: 35.8 % — ABNORMAL LOW (ref 36.0–46.0)
HEMOGLOBIN: 11.6 g/dL — AB (ref 12.0–15.0)
MCH: 30.4 pg (ref 26.0–34.0)
MCHC: 32.4 g/dL (ref 30.0–36.0)
MCV: 93.7 fL (ref 78.0–100.0)
PLATELETS: 222 10*3/uL (ref 150–400)
RBC: 3.82 MIL/uL — AB (ref 3.87–5.11)
RDW: 15.9 % — ABNORMAL HIGH (ref 11.5–15.5)
WBC: 9 10*3/uL (ref 4.0–10.5)

## 2016-09-15 LAB — PROTIME-INR
INR: 2.96
PROTHROMBIN TIME: 31.5 s — AB (ref 11.4–15.2)

## 2016-09-15 MED ORDER — WARFARIN SODIUM 1 MG PO TABS: 1.0000 mg | ORAL_TABLET | Freq: Every day | ORAL | 0 refills | Status: AC

## 2016-09-15 MED ORDER — AMOXICILLIN-POT CLAVULANATE 875-125 MG PO TABS
1.0000 | ORAL_TABLET | Freq: Two times a day (BID) | ORAL | Status: DC
  Administered 2016-09-15: 1 via ORAL
  Filled 2016-09-15: qty 1

## 2016-09-15 MED ORDER — TRAMADOL-ACETAMINOPHEN 37.5-325 MG PO TABS: 1.0000 | ORAL_TABLET | Freq: Three times a day (TID) | ORAL | 0 refills | Status: DC | PRN

## 2016-09-15 MED ORDER — FENTANYL 25 MCG/HR TD PT72: 25.0000 ug | MEDICATED_PATCH | TRANSDERMAL | 0 refills | Status: DC

## 2016-09-15 MED ORDER — BISACODYL 5 MG PO TBEC: 5.0000 mg | DELAYED_RELEASE_TABLET | Freq: Every day | ORAL | 0 refills | Status: AC | PRN

## 2016-09-15 NOTE — Discharge Summary (Signed)
Physician Discharge Summary  Jamse ArnHallie B Manternach WNU:[id number] DOB: 01/13/1923 DOA: 09/09/2016  PCP: Barbie BannerWilson, Fred H, MD  Admit date: 09/09/2016 Discharge date: 09/15/2016  Admitted From: Home Disposition:  SNF  Recommendations for Outpatient Follow-up:  Follow up with M.D. at SNF in 1 week. Follow-up with Dr. Renaye Rakersim Murphy (orthopedics) if needed for persistent or worsening right hip pain. Patient will complete 7 days of antibiotics on 7/12. Resume Coumadin from 7/12. Centile ointment to provide enzymatic debridement of nonviable tissue of right hip wound.  Home Health: None Equipment/Devices: As per therapy at the facility   Discharge Condition: Guarded CODE STATUS: Full code (confirmed again with patient's son who is HCPOA, and expressed that patient had wished to be fully resuscitated if needed)  Diet recommendation: Regular    Discharge Diagnoses:  Principal Problem:   Thoracic compression fracture, closed, initial encounter [facility](HCC)   Active Problems:   Acute diverticulitis   Hypothyroidism   Essential hypertension   Intractable back pain   Atrial fibrillation, persistent (HCC)   Pressure injury of skin   Unspecified open wound, right hip, subsequent encounter  Brief narrative/history of present illness Please refer to admission H&P for details, in brief, 83 y.o.femalewith medical history significant for dementia with behavioral disturbance, chronic atrial fibrillation on warfarin, hypothyroidism, and hypertension, presented with abdominal pain, back pain, nausea and nonbloody/nonbilious vomiting. Symptoms have been intermittent for past 1 month but worsened for the past 3-4 days. Abdominal pain is crampy, severe, generalized and nonradiating. No history of diarrhea, fevers or chills, dysuria, flank pain. CT of the abdomen and pelvis done on admission showing epiploic appendicitis versus focal diverticulitis. No signs of obstruction or abscess. Also for 50% compression fracture at  T11 which is new since March this year. Admit to hospitalist service.  Hospital course  Compression fracture of thoracic vertebrae with intractable pain Did not already Vicodin. Was placed on scheduled Tylenol and when necessary tramadol. Did not proceed with kyphoplasty in the setting of acute infection. Patient had declined bracer in the past. Started on fentanyl patch with good response. PT recommends SNF.  Patient will be discharged on fentanyl patch 25 g every 72 hours and when necessary Ultracet every 8 hours for breakthrough pain. Continue bowel regimen for constipation.  diverticulitis versus epiploic appendicitis Placed on empiric antibiotics, switched to Unasyn. Will discharged on oral Augmentin to complete a total 7 days of antibiotics. Abdominal pain has markedly improved. Follow-up CT abdomen shows increase in small bilateral pleural effusion and? Patchy consolidation in left lower lobe. Does not have any signs or symptoms of pneumonia. No signs of diverticulitis on a PET/CT.  Essential hypertension Continue metoprolol.  paroxysmal A. fib INR supra therapeutic. Holding today's dose of Coumadin and resume at a low-dose (1 mg) tomorrow and follow INR every 12 and 3 days of the facility.  Dementia Noted to be more confused than usual possibly due to pain medication. Seroquel for agitation as needed. Patient has remained calm in the past 24 hours.  Right hip pain with superficial wound. Has unstageable pressure injury measuring 14-1.5 cm. Wound bed has 100% tightly adherent without order or bleeding and has intact surrounding skin. Wound care consult appreciated. Dressing recommendations include centile ointment to provide enzymatic debridement. Evaluated by orthopedic surgeon and think this is a right hip pressure sore and no surgical intervention needed. Agree with wound care management.  Generalized weakness PT recommends SNF.   Family communication: Discussed with son  and daughter  Procedure:CT abdomen and pelvis  Consults: Orthopedics (Dr. Renaye Rakers) Wound care   Disposition: SNF   Discharge Instructions   Allergies as of 09/15/2016      Reactions   Alendronate Other (See Comments)   GI Upset (intolerance) At risk for GI perforation   Aspirin Diarrhea, Other (See Comments)   Melena, black tarry stools   Aspirin-acetaminophen-caffeine Other (See Comments)   Melena, Dark stool   Nsaids Other (See Comments)   Patient has ulcer    Sulfamethoxazole Hives   Sulfonamide Derivatives Hives   Cephalexin Rash   Codeine Nausea And Vomiting   GI upset      Medication List    STOP taking these medications   oxyCODONE-acetaminophen 5-325 MG tablet Commonly known as:  PERCOCET/ROXICET     TAKE these medications   acetaminophen 325 MG tablet Commonly known as:  TYLENOL Take 650 mg by mouth every 6 (six) hours as needed for moderate pain.   amoxicillin-clavulanate 875-125 MG tablet Commonly known as:  AUGMENTIN Take 1 tablet by mouth 2 (two) times daily. One po bid x 7 days   bisacodyl 5 MG EC tablet Commonly known as:  DULCOLAX Take 1 tablet (5 mg total) by mouth daily as needed for moderate constipation.   docusate sodium 250 MG capsule Commonly known as:  COLACE Take 1 capsule (250 mg total) by mouth daily.   fentaNYL 25 MCG/HR patch Commonly known as:  DURAGESIC - dosed mcg/hr Place 1 patch (25 mcg total) onto the skin every 3 (three) days. Start taking on:  09/16/2016   levothyroxine 75 MCG tablet Commonly known as:  SYNTHROID, LEVOTHROID TAKE ONE TABLET BY MOUTH ONCE DAILY   metoprolol tartrate 25 MG tablet Commonly known as:  LOPRESSOR TAKE ONE TABLET BY MOUTH TWICE DAILY-MUST BE SEEN   ondansetron 4 MG disintegrating tablet Commonly known as:  ZOFRAN ODT Take 1 tablet (4 mg total) by mouth every 8 (eight) hours as needed for nausea or vomiting.   QUEtiapine 25 MG tablet Commonly known as:  SEROQUEL Take 25 mg by  mouth 2 (two) times daily.   traMADol-acetaminophen 37.5-325 MG tablet Commonly known as:  ULTRACET Take 1 tablet by mouth every 8 (eight) hours as needed.   warfarin 1 MG tablet Commonly known as:  COUMADIN Take 1 tablet (1 mg total) by mouth daily at 6 PM. Start taking on:  09/16/2016 What changed:  medication strength  how much to take  when to take this      Follow-up Information    Sheral Apley, MD Follow up.   Specialty:  Orthopedic Surgery Why:  Call as needed Contact information: [address] [place] [address] [phone]        MD at SNF in 1 week Follow up in 1 week(s).          Allergies  Allergen Reactions  . Alendronate Other (See Comments)    GI Upset (intolerance) At risk for GI perforation  . Aspirin Diarrhea and Other (See Comments)    Melena, black tarry stools  . Aspirin-Acetaminophen-Caffeine Other (See Comments)    Melena, Dark stool  . Nsaids Other (See Comments)    Patient has ulcer   . Sulfamethoxazole Hives  . Sulfonamide Derivatives Hives  . Cephalexin Rash  . Codeine Nausea And Vomiting    GI upset        Procedures/Studies: Dg Chest 2 View  Result Date: 09/09/2016 CLINICAL DATA:  Three day history of chest pain. EXAM: CHEST  2 VIEW COMPARISON:  04/23/2014 FINDINGS: AP and lateral views of the chest show hyperexpansion. The cardio pericardial silhouette is enlarged. No edema or focal airspace consolidation. Tiny right pleural effusion. The visualized bony structures of the thorax are intact. Telemetry leads overlie the chest. IMPRESSION: 1. Hyperexpansion with cardiomegaly. 2. Tiny right pleural effusion. Electronically Signed   By: Kennith Center M.D.   On: 09/09/2016 16:44   Ct Abdomen Pelvis W Contrast  Result Date: 09/14/2016 CLINICAL DATA:  Dementia and abdominal pain, non bilious vomiting EXAM: CT ABDOMEN AND PELVIS WITH CONTRAST TECHNIQUE: Multidetector CT imaging of the abdomen and pelvis was  performed using the standard protocol following bolus administration of intravenous contrast. CONTRAST:  ISOVUE-300 IOPAMIDOL (ISOVUE-300) INJECTION 61% COMPARISON:  09/09/2016, 07/06/2016 FINDINGS: Lower chest: Small left greater than right bilateral pleural effusions, increased compared to prior CT. Patchy consolidation in the left lower lobe may reflect passive atelectasis or pneumonia. Cardiomegaly with biatrial enlargement. Hepatobiliary: No focal hepatic abnormality. Dilated gallbladder. No calcified stones. Prominent extrahepatic bile duct measuring 7 mm at the head of pancreas. Pancreas: No inflammatory changes. Slight prominence of the proximal duct. Spleen: Normal in size without focal abnormality. Adrenals/Urinary Tract: Adrenal glands are within normal limits. Cortical scarring in the kidneys. No hydronephrosis. Subcentimeter hypodense lesions in the kidneys. Bladder obscured by artifact Stomach/Bowel: Moderate gastric enlargement. Probable large duodenum diverticulum. No evidence for small bowel obstruction. Extensive sigmoid colon diverticular disease without acute inflammation. Mild soft tissue thickening adjacent to the hepatic flexure, unchanged. No adjacent colon wall thickening. Normal appendix. Vascular/Lymphatic: Atherosclerotic vascular disease. No aneurysmal dilatation. No significantly enlarged lymph nodes. Reproductive: Probable calcified fibroids.  No adnexal masses. Other: No free air. Trace free fluid in the pelvis. Diffuse subcutaneous edema Musculoskeletal: Status post right hip replacement with associated artifact. Re- demonstrated fracture of T11 with possible fluid and small gas bubbles in the anterior aspect of the vertebral body. Trace retrolisthesis of L2 on L3. Stable compression deformities of L2 and L4. IMPRESSION: 1. Increased small bilateral pleural effusions. Patchy consolidation in the left lower lobe may reflect atelectasis or pneumonia. Cardiomegaly. 2. Dilated  gallbladder. Slightly enlarged extrahepatic bile duct. Suggest correlation with laboratory values. Right upper quadrant ultrasound may be obtained as indicated 3. Re- demonstrated fracture of T11. Fluid and small gas bubbles in the anterior aspect of the vertebral body. Could consider indolent infection/pathologic fracture. MRI recommended for further evaluation. 4. Extensive sigmoid colon diverticular disease without acute inflammation. Electronically Signed   By: Jasmine Pang M.D.   On: 09/14/2016 19:36   Ct Abdomen Pelvis W Contrast  Result Date: 09/09/2016 CLINICAL DATA:  83 year old female with acute abdominal pain and vomiting for 1 week. EXAM: CT ABDOMEN AND PELVIS WITH CONTRAST TECHNIQUE: Multidetector CT imaging of the abdomen and pelvis was performed using the standard protocol following bolus administration of intravenous contrast. CONTRAST:  75 cc intravenous Isovue-300 COMPARISON:  07/06/2016 lumbar spine MR and radiographs. FINDINGS: Right hip arthroplasty artifact decreases sensitivity within the pelvis. Lower chest: A trace right pleural effusion is noted. Mild bibasilar atelectasis/ scarring noted. Cardiomegaly is identified. Hepatobiliary: The liver and gallbladder are unremarkable. No biliary dilatation. Pancreas: Unremarkable Spleen: Unremarkable Adrenals/Urinary Tract: The kidneys, adrenal glands and bladder are unremarkable except for bilateral renal atrophy and cysts. Stomach/Bowel: There is inflammation adjacent to the colon at the hepatic flexure but no definite adjacent bowel wall thickening - question epiploic appendagitis or possibly focal diverticulitis. There is no evidence of bowel obstruction, pneumoperitoneum, abscess or definite  bowel wall thickening. Descending and sigmoid colonic diverticulosis noted. The appendix is normal. Vascular/Lymphatic: Aortic atherosclerosis. No enlarged abdominal or pelvic lymph nodes. Reproductive: Uterus and bilateral adnexa are unremarkable.  Other: No ascites. Musculoskeletal: A horizontal fracture through the T11 vertebral body is noted with 50% compression, appears new since 07/06/2016 radiographs. This fracture does not definitely extend into the posterior elements. Remote compression fractures of L2 and L4 again noted. Multilevel degenerative disc disease and facet arthropathy again noted. IMPRESSION: Inflammation adjacent to the colon at the hepatic flexure -question epiploic appendagitis or possibly focal diverticulitis. No evidence of bowel obstruction, pneumoperitoneum or abscess. 50% compression fracture of T11, appears new since 07/06/2016 but may be subacute. Trace right pleural effusion. Remote L2 and L4 compression fractures. Aortic Atherosclerosis (ICD10-I70.0). Electronically Signed   By: Harmon Pier M.D.   On: 09/09/2016 20:19   Dg Hip Unilat With Pelvis 2-3 Views Right  Result Date: 09/10/2016 CLINICAL DATA:  Lateral right hip pain today. EXAM: DG HIP (WITH OR WITHOUT PELVIS) 2-3V RIGHT COMPARISON:  04/23/2014 FINDINGS: Visualization of the pelvis is limited by overlapping bowel gas. Bipolar right hip hemiarthroplasty is located. No periprosthetic fracture. No evidence of pelvic ring or contralateral hip fracture. Lumbar disc and facet degeneration. Remote L4 compression fracture with asymmetric left height loss. IMPRESSION: No acute finding. Electronically Signed   By: Marnee Spring M.D.   On: 09/10/2016 17:30     Subjective: Complains of pain in her right hip and back  Discharge Exam: Vitals:   09/14/16 2105 09/15/16 0511  BP: (!) 138/96 (!) 143/88  Pulse: 66 73  Resp: 16 17  Temp: 98.1 F (36.7 C) 98.2 F (36.8 C)   Vitals:   09/14/16 1000 09/14/16 1429 09/14/16 2105 09/15/16 0511  BP: (!) 147/117 131/79 (!) 138/96 (!) 143/88  Pulse: 72 71 66 73  Resp:  18 16 17   Temp:  98.2 F (36.8 C) 98.1 F (36.7 C) 98.2 F (36.8 C)  TempSrc:  Oral Oral Oral  SpO2:  99% 99% 98%  Weight:      Height:         General: Elderly female appears fatigued, not in distress   HEENT: Moist mucosa, supple neck  chest: Clear to condition bilaterally CVS: S1 and S2 irregular, no murmurs rub or gallop GI: Soft, nondistended, nontender Musculoskeletal:: Warm, no edema, pressure injury of the right hip without order for bleeding. CNS: AAO 1    The results of significant diagnostics from this hospitalization (including imaging, microbiology, ancillary and laboratory) are listed below for reference.     Microbiology: No results found for this or any previous visit (from the past 240 hour(s)).   Labs: BNP (last 3 results)  Recent Labs  09/09/16 1623  BNP 153.9*   Basic Metabolic Panel:  Recent Labs Lab 09/10/16 0347 09/11/16 0735 09/12/16 0819 09/14/16 0822 09/15/16 0350  NA 134* 135 135 137 136  K 3.7 3.3* 3.5 3.5 3.5  CL 101 103 105 102 103  CO2 28 26 25 27 28   GLUCOSE 103* 101* 105* 96 122*  BUN 13 9 7  5* 5*  CREATININE 0.93 0.93 0.82 0.83 0.76  CALCIUM 8.3* 8.0* 7.8* 8.3* 7.7*   Liver Function Tests:  Recent Labs Lab 09/09/16 1623  AST 40  ALT 20  ALKPHOS 78  BILITOT 0.9  PROT 7.3  ALBUMIN 3.6    Recent Labs Lab 09/09/16 1623  LIPASE 18   No results for input(s): AMMONIA in the last  168 hours. CBC:  Recent Labs Lab 09/10/16 0347 09/11/16 0735 09/12/16 0819 09/14/16 0822 09/15/16 0350  WBC 8.7 10.9* 10.3 10.7* 9.0  HGB 12.1 11.7* 12.2 13.3 11.6*  HCT 36.9 35.8* 36.7 40.4 35.8*  MCV 94.4 94.2 95.1 94.4 93.7  PLT 248 239 234 254 222   Cardiac Enzymes: No results for input(s): CKTOTAL, CKMB, CKMBINDEX, TROPONINI in the last 168 hours. BNP: Invalid input(s): POCBNP CBG: No results for input(s): GLUCAP in the last 168 hours. D-Dimer No results for input(s): DDIMER in the last 72 hours. Hgb A1c No results for input(s): HGBA1C in the last 72 hours. Lipid Profile No results for input(s): CHOL, HDL, LDLCALC, TRIG, CHOLHDL, LDLDIRECT in the last 72  hours. Thyroid function studies No results for input(s): TSH, T4TOTAL, T3FREE, THYROIDAB in the last 72 hours.  Invalid input(s): FREET3 Anemia work up No results for input(s): VITAMINB12, FOLATE, FERRITIN, TIBC, IRON, RETICCTPCT in the last 72 hours. Urinalysis    Component Value Date/Time   COLORURINE YELLOW 09/09/2016 1623   APPEARANCEUR HAZY (A) 09/09/2016 1623   LABSPEC 1.020 09/09/2016 1623   PHURINE 6.0 09/09/2016 1623   GLUCOSEU NEGATIVE 09/09/2016 1623   HGBUR NEGATIVE 09/09/2016 1623   BILIRUBINUR NEGATIVE 09/09/2016 1623   KETONESUR NEGATIVE 09/09/2016 1623   PROTEINUR 100 (A) 09/09/2016 1623   UROBILINOGEN 1.0 08/02/2009 1531   NITRITE NEGATIVE 09/09/2016 1623   LEUKOCYTESUR SMALL (A) 09/09/2016 1623   Sepsis Labs Invalid input(s): PROCALCITONIN,  WBC,  LACTICIDVEN Microbiology No results found for this or any previous visit (from the past 240 hour(s)).   Time coordinating discharge: Over 30 minutes  SIGNED:   Eddie North, MD  [facility] 09/15/2016, 2:04 PM Pager   If 7PM-7AM, please contact night-coverage [email or web address] Password [email or web address]

## 2016-09-15 NOTE — Progress Notes (Addendum)
Patient and family has accepted bed offer at [facility]. [facility]. CSW faxed DC summary and FL2 to facility. Patient will need hard scripts of any controlled substances and medically necessity form for transportation. CSW will update 2nd shift social worker to follow up due to shift change.   RN can call report to [phone]  Stacy GardnerErin Milbert Bixler, [facility] PaCSWA Emergency Room Clinical Social Worker [phone]

## 2016-09-15 NOTE — Progress Notes (Signed)
ANTICOAGULATION CONSULT NOTE - Follow Up Consult  Pharmacy Consult for warfarin Indication: atrial fibrillation  Allergies  Allergen Reactions  . Alendronate Other (See Comments)    GI Upset (intolerance) At risk for GI perforation  . Aspirin Diarrhea and Other (See Comments)    Melena, black tarry stools  . Aspirin-Acetaminophen-Caffeine Other (See Comments)    Melena, Dark stool  . Nsaids Other (See Comments)    Patient has ulcer   . Sulfamethoxazole Hives  . Sulfonamide Derivatives Hives  . Cephalexin Rash  . Codeine Nausea And Vomiting    GI upset    Patient Measurements: Height: 5\' 2"  (157.5 cm) Weight: 115 lb 4.8 oz (52.3 kg) IBW/kg (Calculated) : 50.1  Vital Signs: Temp: 98.2 F (36.8 C) (07/11 0511) Temp Source: Oral (07/11 0511) BP: 143/88 (07/11 0511) Pulse Rate: 73 (07/11 0511)  Labs:  Recent Labs  09/12/16 0819 09/13/16 0353 09/14/16 0415 09/14/16 0822 09/15/16 0350  HGB 12.2  --   --  13.3 11.6*  HCT 36.7  --   --  40.4 35.8*  PLT 234  --   --  254 222  LABPROT  --  36.8* 32.9*  --  31.5*  INR  --  3.61 3.13  --  2.96  CREATININE 0.82  --   --  0.83 0.76    Estimated Creatinine Clearance: 36.2 mL/min (by C-G formula based on SCr of 0.76 mg/dL).  Assessment: Amanda [name] on warfarin for atrial fibrillation admitted with compression fracture and abdominal pain. PTA warfarin dose reported as 2.5 mg daily.    Today, 09/15/2016:  INR 2.96, decreased to upper end of therapeutic after holding warfarin x3 days  Last warfarin dose prior to admission was 7/4 1900. Warfarin resumed on 7/6 and Pharmacy ordered 2.5 mg early 7/6am for missed dose 7/5 but dose was refused.  Doses given on 7/6-7/7, held starting 7/8.  CBC:  Hgb decreased to 11.6, Platelets WNL   No bleeding or complications reported  Diet: Full liquids, eating only 25-50% of meals  Drug interactions: Cipro and Flagyl given 7/5-7/8, changed to Unasyn to decrease interaction with warfarin.   Now changed to Augmentin PO x2 more days.  Goal of Therapy:  INR 2-3   Plan:   Continue to hold warfarin today  Drug interactions, acute illness, and poor PO intake likely contributing to elevated INR   Daily INR  Monitor CBC and for s/s bleeding or thrombosis.  ** For discharge, recommend to resume warfarin on 7/12 at lower dose, Warfarin 1mg  PO daily.  Recheck INR in 3 days and adjust warfarin dose as needed.   Lynann Beaverhristine Araiyah Cumpton PharmD, BCPS Pager [phone] 09/15/2016 7:56 AM

## 2016-09-15 NOTE — Progress Notes (Signed)
CSW updated pt's son and daughter pt is ready [facility].  Pt's daughter stated she was [facility] to sign [facility]'s intake paperwork.  CSW [facility]ina [facility].  [facility] Number for report is: [phone] Pt's unit/room/bed number will be: [id number] Accepting physician: SNF MD   Pt can arrive ASAP on 09/15/16   CSW will updated RN.  RN stated CSW cnn call [facility]  4:30 PM CSW called [facility].  [facility] stated they are "one to one and one half hours out currently".   Dorothe PeaJonathan F. Tahmid Stonehocker, Francesco SorLCSWA, LCAS, CSI Clinical Social Worker Ph: [phone]

## 2016-09-15 NOTE — Progress Notes (Signed)
DC plan for SNF placement. CM will follow and assist as needed. Sandford Crazeora Azarah Dacy RN,BSN,NCM [phone]

## 2016-09-15 NOTE — Progress Notes (Signed)
Report called to Gala Murdochanisha, Charity fundraiserN at [facility]

## 2017-03-14 ENCOUNTER — Telehealth: Payer: Self-pay | Admitting: Cardiology

## 2017-03-14 NOTE — Telephone Encounter (Signed)
New message    Patient daughter calling with concerns about INR. Patient is in a SNF. Patient daughter wants education on  what could cause the INR to increase so high in 1 week. Daughter states INR went from 3-8.1 in a week. Please call

## 2017-03-14 NOTE — Telephone Encounter (Signed)
Pt's daughter called with concerns re elevated inr of 8 the patient is in nursing facility and has weekly inr done Per daughter the medical director md gave some changes in regards to Coumadin but both daughter and son were not in agreement with directions was wanting more changes the patient's daughter aware those changes that were made should help with future value .pt  Is monitored weekly /cy

## 2017-04-04 ENCOUNTER — Other Ambulatory Visit: Payer: Self-pay

## 2017-04-04 ENCOUNTER — Emergency Department (HOSPITAL_COMMUNITY): Payer: Medicare Other

## 2017-04-04 ENCOUNTER — Inpatient Hospital Stay (HOSPITAL_COMMUNITY)
Admission: EM | Admit: 2017-04-04 | Discharge: 2017-04-07 | DRG: 308 | Disposition: A | Discharge status: Skilled Nursing Facility | Payer: Medicare Other | Attending: Family Medicine | Admitting: Family Medicine

## 2017-04-04 DIAGNOSIS — I481 Persistent atrial fibrillation: Secondary | ICD-10-CM | POA: Diagnosis present

## 2017-04-04 DIAGNOSIS — Z886 Allergy status to analgesic agent status: Secondary | ICD-10-CM

## 2017-04-04 DIAGNOSIS — Z515 Encounter for palliative care: Secondary | ICD-10-CM

## 2017-04-04 DIAGNOSIS — Z7189 Other specified counseling: Secondary | ICD-10-CM | POA: Diagnosis not present

## 2017-04-04 DIAGNOSIS — E039 Hypothyroidism, unspecified: Secondary | ICD-10-CM | POA: Diagnosis present

## 2017-04-04 DIAGNOSIS — Z7901 Long term (current) use of anticoagulants: Secondary | ICD-10-CM | POA: Diagnosis not present

## 2017-04-04 DIAGNOSIS — Z881 Allergy status to other antibiotic agents status: Secondary | ICD-10-CM

## 2017-04-04 DIAGNOSIS — R627 Adult failure to thrive: Secondary | ICD-10-CM | POA: Diagnosis present

## 2017-04-04 DIAGNOSIS — I5043 Acute on chronic combined systolic (congestive) and diastolic (congestive) heart failure: Secondary | ICD-10-CM | POA: Diagnosis present

## 2017-04-04 DIAGNOSIS — G4700 Insomnia, unspecified: Secondary | ICD-10-CM | POA: Diagnosis present

## 2017-04-04 DIAGNOSIS — Z8711 Personal history of peptic ulcer disease: Secondary | ICD-10-CM

## 2017-04-04 DIAGNOSIS — R64 Cachexia: Secondary | ICD-10-CM | POA: Diagnosis present

## 2017-04-04 DIAGNOSIS — M1710 Unilateral primary osteoarthritis, unspecified knee: Secondary | ICD-10-CM | POA: Diagnosis present

## 2017-04-04 DIAGNOSIS — I482 Chronic atrial fibrillation: Secondary | ICD-10-CM | POA: Diagnosis present

## 2017-04-04 DIAGNOSIS — I5021 Acute systolic (congestive) heart failure: Secondary | ICD-10-CM | POA: Diagnosis not present

## 2017-04-04 DIAGNOSIS — Z79890 Hormone replacement therapy (postmenopausal): Secondary | ICD-10-CM | POA: Diagnosis not present

## 2017-04-04 DIAGNOSIS — M1990 Unspecified osteoarthritis, unspecified site: Secondary | ICD-10-CM | POA: Diagnosis present

## 2017-04-04 DIAGNOSIS — R531 Weakness: Secondary | ICD-10-CM

## 2017-04-04 DIAGNOSIS — Z882 Allergy status to sulfonamides status: Secondary | ICD-10-CM | POA: Diagnosis not present

## 2017-04-04 DIAGNOSIS — I110 Hypertensive heart disease with heart failure: Secondary | ICD-10-CM | POA: Diagnosis present

## 2017-04-04 DIAGNOSIS — F0391 Unspecified dementia with behavioral disturbance: Secondary | ICD-10-CM | POA: Diagnosis present

## 2017-04-04 DIAGNOSIS — Z885 Allergy status to narcotic agent status: Secondary | ICD-10-CM

## 2017-04-04 DIAGNOSIS — J9601 Acute respiratory failure with hypoxia: Secondary | ICD-10-CM | POA: Diagnosis present

## 2017-04-04 DIAGNOSIS — K219 Gastro-esophageal reflux disease without esophagitis: Secondary | ICD-10-CM | POA: Diagnosis present

## 2017-04-04 DIAGNOSIS — I4891 Unspecified atrial fibrillation: Secondary | ICD-10-CM | POA: Diagnosis not present

## 2017-04-04 DIAGNOSIS — I248 Other forms of acute ischemic heart disease: Secondary | ICD-10-CM | POA: Diagnosis present

## 2017-04-04 DIAGNOSIS — M8008XD Age-related osteoporosis with current pathological fracture, vertebra(e), subsequent encounter for fracture with routine healing: Secondary | ICD-10-CM | POA: Diagnosis present

## 2017-04-04 DIAGNOSIS — R748 Abnormal levels of other serum enzymes: Secondary | ICD-10-CM

## 2017-04-04 DIAGNOSIS — I480 Paroxysmal atrial fibrillation: Principal | ICD-10-CM | POA: Diagnosis present

## 2017-04-04 DIAGNOSIS — E785 Hyperlipidemia, unspecified: Secondary | ICD-10-CM | POA: Diagnosis present

## 2017-04-04 DIAGNOSIS — R0602 Shortness of breath: Secondary | ICD-10-CM | POA: Diagnosis not present

## 2017-04-04 DIAGNOSIS — Z96641 Presence of right artificial hip joint: Secondary | ICD-10-CM | POA: Diagnosis present

## 2017-04-04 DIAGNOSIS — R54 Age-related physical debility: Secondary | ICD-10-CM | POA: Diagnosis present

## 2017-04-04 DIAGNOSIS — I509 Heart failure, unspecified: Secondary | ICD-10-CM | POA: Diagnosis not present

## 2017-04-04 DIAGNOSIS — I5023 Acute on chronic systolic (congestive) heart failure: Secondary | ICD-10-CM | POA: Diagnosis not present

## 2017-04-04 DIAGNOSIS — Z993 Dependence on wheelchair: Secondary | ICD-10-CM

## 2017-04-04 DIAGNOSIS — L8990 Pressure ulcer of unspecified site, unspecified stage: Secondary | ICD-10-CM

## 2017-04-04 DIAGNOSIS — Z66 Do not resuscitate: Secondary | ICD-10-CM | POA: Diagnosis not present

## 2017-04-04 DIAGNOSIS — Z6820 Body mass index (BMI) 20.0-20.9, adult: Secondary | ICD-10-CM

## 2017-04-04 DIAGNOSIS — I361 Nonrheumatic tricuspid (valve) insufficiency: Secondary | ICD-10-CM | POA: Diagnosis not present

## 2017-04-04 DIAGNOSIS — E43 Unspecified severe protein-calorie malnutrition: Secondary | ICD-10-CM

## 2017-04-04 LAB — COMPREHENSIVE METABOLIC PANEL
ALBUMIN: 2.6 g/dL — AB (ref 3.5–5.0)
ALK PHOS: 114 U/L (ref 38–126)
ALT: 55 U/L — AB (ref 14–54)
ALT: 51 U/L (ref 14–54)
AST: 126 U/L — AB (ref 15–41)
AST: 106 U/L — AB (ref 15–41)
Albumin: 2.5 g/dL — ABNORMAL LOW (ref 3.5–5.0)
Alkaline Phosphatase: 118 U/L (ref 38–126)
Anion gap: 12 (ref 5–15)
Anion gap: 10 (ref 5–15)
BUN: 19 mg/dL (ref 6–20)
BUN: 18 mg/dL (ref 6–20)
CALCIUM: 8.4 mg/dL — AB (ref 8.9–10.3)
CHLORIDE: 102 mmol/L (ref 101–111)
CO2: 20 mmol/L — AB (ref 22–32)
CO2: 22 mmol/L (ref 22–32)
CREATININE: 1.13 mg/dL — AB (ref 0.44–1.00)
CREATININE: 1.12 mg/dL — AB (ref 0.44–1.00)
Calcium: 8.4 mg/dL — ABNORMAL LOW (ref 8.9–10.3)
Chloride: 102 mmol/L (ref 101–111)
GFR calc Af Amer: 47 mL/min — ABNORMAL LOW (ref >60)
GFR calc non Af Amer: 41 mL/min — ABNORMAL LOW (ref >60)
GFR, EST AFRICAN AMERICAN: 48 mL/min — AB (ref >60)
GFR, EST NON AFRICAN AMERICAN: 41 mL/min — AB (ref >60)
Glucose, Bld: 165 mg/dL — ABNORMAL HIGH (ref 65–99)
Glucose, Bld: 154 mg/dL — ABNORMAL HIGH (ref 65–99)
Potassium: 6.6 mmol/L (ref 3.5–5.1)
Potassium: 5.3 mmol/L — ABNORMAL HIGH (ref 3.5–5.1)
Sodium: 134 mmol/L — ABNORMAL LOW (ref 135–145)
Sodium: 134 mmol/L — ABNORMAL LOW (ref 135–145)
Total Bilirubin: 1.7 mg/dL — ABNORMAL HIGH (ref 0.3–1.2)
Total Bilirubin: 0.7 mg/dL (ref 0.3–1.2)
Total Protein: 7.4 g/dL (ref 6.5–8.1)
Total Protein: 7.3 g/dL (ref 6.5–8.1)

## 2017-04-04 LAB — I-STAT CHEM 8, ED
BUN: 25 mg/dL — AB (ref 6–20)
CHLORIDE: 104 mmol/L (ref 101–111)
CREATININE: 1 mg/dL (ref 0.44–1.00)
Calcium, Ion: 1.07 mmol/L — ABNORMAL LOW (ref 1.15–1.40)
GLUCOSE: 173 mg/dL — AB (ref 65–99)
HCT: 34 % — ABNORMAL LOW (ref 36.0–46.0)
Hemoglobin: 11.6 g/dL — ABNORMAL LOW (ref 12.0–15.0)
POTASSIUM: 6.5 mmol/L — AB (ref 3.5–5.1)
Sodium: 135 mmol/L (ref 135–145)
TCO2: 27 mmol/L (ref 22–32)

## 2017-04-04 LAB — TROPONIN I
TROPONIN I: 5.49 ng/mL — AB (ref <0.03)
TROPONIN I: 4.43 ng/mL — AB (ref <0.03)
TROPONIN I: 5.68 ng/mL — AB (ref <0.03)

## 2017-04-04 LAB — URINALYSIS, ROUTINE W REFLEX MICROSCOPIC
BILIRUBIN URINE: NEGATIVE
Glucose, UA: NEGATIVE mg/dL
Ketones, ur: NEGATIVE mg/dL
Leukocytes, UA: NEGATIVE
Nitrite: NEGATIVE
PROTEIN: 30 mg/dL — AB
SPECIFIC GRAVITY, URINE: 1.011 (ref 1.005–1.030)
pH: 7 (ref 5.0–8.0)

## 2017-04-04 LAB — CBC WITH DIFFERENTIAL/PLATELET
BASOS PCT: 0 %
Basophils Absolute: 0 10*3/uL (ref 0.0–0.1)
EOS PCT: 0 %
Eosinophils Absolute: 0 10*3/uL (ref 0.0–0.7)
HEMATOCRIT: 33 % — AB (ref 36.0–46.0)
Hemoglobin: 10.1 g/dL — ABNORMAL LOW (ref 12.0–15.0)
Lymphocytes Relative: 10 %
Lymphs Abs: 1.1 10*3/uL (ref 0.7–4.0)
MCH: 26.9 pg (ref 26.0–34.0)
MCHC: 30.6 g/dL (ref 30.0–36.0)
MCV: 88 fL (ref 78.0–100.0)
MONO ABS: 0.7 10*3/uL (ref 0.1–1.0)
Monocytes Relative: 6 %
Neutro Abs: 9.6 10*3/uL — ABNORMAL HIGH (ref 1.7–7.7)
Neutrophils Relative %: 84 %
PLATELETS: 312 10*3/uL (ref 150–400)
RBC: 3.75 MIL/uL — ABNORMAL LOW (ref 3.87–5.11)
RDW: 16.7 % — ABNORMAL HIGH (ref 11.5–15.5)
WBC: 11.4 10*3/uL — ABNORMAL HIGH (ref 4.0–10.5)

## 2017-04-04 LAB — I-STAT ARTERIAL BLOOD GAS, ED
Acid-base deficit: 1 mmol/L (ref 0.0–2.0)
BICARBONATE: 22.5 mmol/L (ref 20.0–28.0)
O2 SAT: 98 %
PCO2 ART: 35 mmHg (ref 32.0–48.0)
Patient temperature: 99.8
TCO2: 24 mmol/L (ref 22–32)
pH, Arterial: 7.419 (ref 7.350–7.450)
pO2, Arterial: 97 mmHg (ref 83.0–108.0)

## 2017-04-04 LAB — PROTIME-INR
INR: 1.95
PROTHROMBIN TIME: 22.1 s — AB (ref 11.4–15.2)

## 2017-04-04 LAB — I-STAT TROPONIN, ED: Troponin i, poc: 5.87 ng/mL (ref 0.00–0.08)

## 2017-04-04 LAB — TSH: TSH: 3.579 u[IU]/mL (ref 0.350–4.500)

## 2017-04-04 LAB — I-STAT CG4 LACTIC ACID, ED: Lactic Acid, Venous: 2.55 mmol/L (ref 0.5–1.9)

## 2017-04-04 LAB — BRAIN NATRIURETIC PEPTIDE: B Natriuretic Peptide: 511.3 pg/mL — ABNORMAL HIGH (ref 0.0–100.0)

## 2017-04-04 LAB — MAGNESIUM: MAGNESIUM: 1.7 mg/dL (ref 1.7–2.4)

## 2017-04-04 LAB — LACTIC ACID, PLASMA: Lactic Acid, Venous: 1.5 mmol/L (ref 0.5–1.9)

## 2017-04-04 MED ORDER — ENSURE ENLIVE PO LIQD
237.0000 mL | Freq: Two times a day (BID) | ORAL | Status: DC
  Administered 2017-04-05 – 2017-04-07 (×4): 237 mL via ORAL

## 2017-04-04 MED ORDER — DEXTROSE 5 % IV SOLN
2.0000 g | Freq: Once | INTRAVENOUS | Status: AC
  Administered 2017-04-04: 2 g via INTRAVENOUS
  Filled 2017-04-04: qty 2

## 2017-04-04 MED ORDER — SODIUM CHLORIDE 0.9% FLUSH: 3.0000 mL | INTRAVENOUS | Status: DC | PRN

## 2017-04-04 MED ORDER — WARFARIN SODIUM 2 MG PO TABS
2.0000 mg | ORAL_TABLET | Freq: Once | ORAL | Status: AC
  Administered 2017-04-04: 2 mg via ORAL
  Filled 2017-04-04: qty 1

## 2017-04-04 MED ORDER — DILTIAZEM HCL-DEXTROSE 100-5 MG/100ML-% IV SOLN (PREMIX)
5.0000 mg/h | INTRAVENOUS | Status: DC
  Administered 2017-04-04: 10 mg/h via INTRAVENOUS
  Filled 2017-04-04: qty 100

## 2017-04-04 MED ORDER — POLYETHYLENE GLYCOL 3350 17 G PO PACK: 17.0000 g | PACK | Freq: Every day | ORAL | Status: DC | PRN

## 2017-04-04 MED ORDER — ONDANSETRON HCL 4 MG/2ML IJ SOLN: 4.0000 mg | Freq: Four times a day (QID) | INTRAMUSCULAR | Status: DC | PRN

## 2017-04-04 MED ORDER — VANCOMYCIN HCL IN DEXTROSE 1-5 GM/200ML-% IV SOLN
1000.0000 mg | Freq: Once | INTRAVENOUS | Status: AC
  Administered 2017-04-04: 1000 mg via INTRAVENOUS
  Filled 2017-04-04: qty 200

## 2017-04-04 MED ORDER — METOPROLOL TARTRATE 25 MG PO TABS
25.0000 mg | ORAL_TABLET | Freq: Two times a day (BID) | ORAL | Status: DC
  Administered 2017-04-04 – 2017-04-06 (×5): 25 mg via ORAL
  Filled 2017-04-04 (×6): qty 1

## 2017-04-04 MED ORDER — LEVOTHYROXINE SODIUM 88 MCG PO TABS
88.0000 ug | ORAL_TABLET | Freq: Every day | ORAL | Status: DC
  Administered 2017-04-05 – 2017-04-07 (×3): 88 ug via ORAL
  Filled 2017-04-04 (×3): qty 1

## 2017-04-04 MED ORDER — SODIUM CHLORIDE 0.9 % IV SOLN: 250.0000 mL | INTRAVENOUS | Status: DC | PRN

## 2017-04-04 MED ORDER — WARFARIN - PHARMACIST DOSING INPATIENT: Freq: Every day | Status: DC

## 2017-04-04 MED ORDER — ONDANSETRON HCL 4 MG PO TABS: 4.0000 mg | ORAL_TABLET | Freq: Four times a day (QID) | ORAL | Status: DC | PRN

## 2017-04-04 MED ORDER — FUROSEMIDE 10 MG/ML IJ SOLN
20.0000 mg | Freq: Once | INTRAMUSCULAR | Status: AC
  Administered 2017-04-04: 20 mg via INTRAVENOUS
  Filled 2017-04-04: qty 2

## 2017-04-04 MED ORDER — SODIUM CHLORIDE 0.9% FLUSH
3.0000 mL | Freq: Two times a day (BID) | INTRAVENOUS | Status: DC
  Administered 2017-04-05 – 2017-04-07 (×5): 3 mL via INTRAVENOUS

## 2017-04-04 MED ORDER — VANCOMYCIN HCL 500 MG IV SOLR
500.0000 mg | INTRAVENOUS | Status: DC
  Administered 2017-04-05: 500 mg via INTRAVENOUS
  Filled 2017-04-04: qty 500

## 2017-04-04 MED ORDER — DILTIAZEM HCL-DEXTROSE 100-5 MG/100ML-% IV SOLN (PREMIX)
5.0000 mg/h | Freq: Once | INTRAVENOUS | Status: AC
  Administered 2017-04-04: 5 mg/h via INTRAVENOUS
  Filled 2017-04-04: qty 100

## 2017-04-04 MED ORDER — QUETIAPINE FUMARATE 25 MG PO TABS
25.0000 mg | ORAL_TABLET | Freq: Two times a day (BID) | ORAL | Status: DC
  Administered 2017-04-04 – 2017-04-07 (×6): 25 mg via ORAL
  Filled 2017-04-04 (×6): qty 1

## 2017-04-04 MED ORDER — DEXTROSE 5 % IV SOLN
1.0000 g | INTRAVENOUS | Status: DC
  Administered 2017-04-05: 1 g via INTRAVENOUS
  Filled 2017-04-04: qty 1

## 2017-04-04 NOTE — ED Triage Notes (Signed)
[facility] to [facility] where staff called out for shortness of breath and hypoxia with sats in the 70's, per EMS patient states the sob started last night. Recent diagnosis of pneumonia. Atrial fibrillation hx, currently running 120-160. Pt is alert to self, hx of dementia.

## 2017-04-04 NOTE — H&P (Signed)
Family Medicine Teaching [facility] Admission History and Physical Service Pager: [phone]  Patient name: Amanda [name] Medical record number: [id number] Date of birth: 05/09/1922 Age: 84 y.o. Gender: female  Primary Care Provider: Barbie Banner, MD Consultants: Cardiology Code Status: Full  Chief Complaint: Shortness of breath  Assessment and Plan: Amanda [name] is a 84 y.o. female presenting with cough, SOB for the past few days. PMH is significant for A fib, dementia, hypothyroidism, HLD, HTN, GERD, osteoporosis.  Acute hypoxic respiratory failure  HFiEF Few weeks h/o of worsening shortness of breath and cough,  with hypoxia to 70s per EMS. S/p outpatient treatment with Z-pak last week for CAP. ECHO 06/2009 c/w EF 40% with anterolateral hypokinesis improved to 60% 06/2013. Follows with Dr. Delton See for Cardiology, last seen since 2016. There is concern for acute worsening of HF given new CXR c/w cardiomegaly and bilateral pleural effusions.  Patient received IV Lasix 20 mg x1 in ED. Initial trop 5.87 with repeat 5.49, likely demand ischemia per cardiology review as patient was in Afib with RVR at the time of admission now stabilized on Cardizem drip. There may also be a component of HAP given CXR findings c/w questionable RLL infiltrate, cough, leukocytosis and lactic acidosis with new oxygen requirment. ABG WNL. Broad spectrum antibiotics initiated in the ED.  - admit to telemetry, attending Dr. Deirdre Priest - Cardiology following, appreciate recs - continuous cardiac monitoring and pulse ox - am CBC, CMP - daily weights - strict I&Os - ECHO - trend trops - am EKG - IS - continue Cefepime/Vanc (1/28 - ) - continue Cardizem drip, will transition to PO when stable - s/p IV Lasix 20mg  in ED with plans for additional dose  Paroxysmal Afib On Warfarin. INR on admission 1.95. In RVR with HR 130. Cardiology following. Rate now controlled on Cardizem drip. Previously on Metoprolol  tartrate, but recently stopped at SNF 3 weeks ago by facility physician for unknown reasons. - see plan above - am PT/INR  Dementia with behavioral disturbance Resident at [facility] for the past several months. Previously at [facility] for rehab after last hospitalization in 09/2016. On Seroquel, Ativan at home. - continue home seroquel - hold home ativan, continue restarting if becomes agitated - Palliative Care consult  Hypertension Previously on Metoprolol tartrate 25mg  BID but was stopped at her SNF recently, family unsure why. - continue home metoprolol - continue seroquel  Osteoporosis Previous hospitalization 09/2016 for thoracic compression fracture. - monitor  Hypothyroidism On synthroid at home.  - TSH - continue home synthroid  FEN/GI: regular diet Prophylaxis: Warfarin per pharmacy  Disposition: admit to stepdown, attending Dr. Deirdre Priest  History of Present Illness:  Amanda [name] is a 84 y.o. female presenting with shortness of breath.  Resident at [facility] for the past several months. She has been experiencing cough and increased weakness over the past few days, had blood work with leukocytosis, CXR concerning for PNA and completed Z-pack last week. She could not eat breakfast as of yesterday and was much more SOB with sats to 70% per EMS. Therefore, Dr. Arlyce Dice had her transferred to the ED. She was also requiring O2 at her facility for the past few days. Patient is wheelchair bound. Does not manage her medications. Eats regular diet without history of choking. No previous O2 requirement.  In the ED, received CXR concerning for pulmonary edema and questionable RLL pneumonia. Started on broad spectrum antibiotics. Also presented in Afib with  RVR to HR low 100s and troponin of 5.87. EKG without significant ST changes. Cardiology consulted in the ED who placed patient on Cardizem drip for rate control. Also received IV Lasix  20mg  for edema without much urine output. Bladder scan pending at the time of admission.  Review Of Systems: See HPI for pertinent.  Review of Systems  Constitutional: Positive for malaise/fatigue. Negative for chills and fever.  HENT: Negative for congestion and sore throat.   Eyes: Negative for blurred vision and double vision.  Respiratory: Positive for cough and shortness of breath. Negative for sputum production.   Cardiovascular: Negative for chest pain, palpitations and leg swelling.  Gastrointestinal: Negative for abdominal pain, constipation, diarrhea, nausea and vomiting.  Genitourinary: Negative for dysuria, frequency and urgency.  Musculoskeletal: Negative for myalgias and neck pain.  Skin: Negative for itching and rash.  Neurological: Positive for weakness. Negative for focal weakness and headaches.  Psychiatric/Behavioral: Negative for substance abuse. The patient is not nervous/anxious.     Patient Active Problem List   Diagnosis Date Noted  . A-fib (HCC) 04/04/2017  . Atrial fibrillation (HCC) 04/04/2017  . Acute diverticulitis 09/15/2016  . Unspecified open wound, right hip, subsequent encounter 09/15/2016  . Pressure injury of skin 09/10/2016  . Thoracic compression fracture, closed, initial encounter (HCC) 09/09/2016  . Right inguinal hernia 05/14/2015  . Atrial fibrillation, persistent (HCC) 02/21/2015  . Femoral neck fracture (HCC) 04/23/2014  . Hip fracture due to osteoporosis (HCC) 04/23/2014  . Long term current use of anticoagulant therapy 10/07/2010  . Essential hypertension 07/21/2009  . CHEST PAIN 07/11/2009  . Hypothyroidism 07/03/2009  . Hyperlipidemia 07/03/2009  . GERD 07/03/2009  . OSTEOARTHRITIS, KNEE 07/03/2009  . Intractable back pain 07/03/2009  . Osteoporosis 07/03/2009    Past Medical History: Past Medical History:  Diagnosis Date  . Anxiety   . Arthritis   . Atrial fibrillation (HCC)   . Atrial fibrillation (HCC)   . Back pain    . Dysrhythmia   . GERD (gastroesophageal reflux disease)   . HLD (hyperlipidemia)   . Hypertension   . Hypothyroidism   . Insomnia   . Memory impairment   . Osteoarthritis    knee  . Osteoporosis   . PUD (peptic ulcer disease)    hx of upper GI bleeding   . Wears glasses     Past Surgical History: Past Surgical History:  Procedure Laterality Date  . COLONOSCOPY    . ESOPHAGOGASTRODUODENOSCOPY    . HIP ARTHROPLASTY Right 04/25/2014   Procedure: ARTHROPLASTY BIPOLAR HIP;  Surgeon: Sheral Apley, MD;  Location[facility] OR;  Service: Orthopedics;  Laterality: Right;  . INGUINAL HERNIA REPAIR Right 05/14/2015   Procedure: RIGHT INGUINAL HERNIA REPAIR WITH MESH;  Surgeon: Chevis Pretty III, MD;  Location: [facility] OR;  Service: General;  Laterality: Right;  . INSERTION OF MESH Right 05/14/2015   Procedure: INSERTION OF MESH;  Surgeon: Chevis Pretty III, MD;  Location: [facility] OR;  Service: General;  Laterality: Right;  . tumor removed from right side of face      Social History: Social History   Tobacco Use  . Smoking status: Never Smoker  . Smokeless tobacco: Never Used  . Tobacco comment: tobacco use - no  Substance Use Topics  . Alcohol use: No  . Drug use: No    Family History: Family History  Problem Relation Age of Onset  . Kidney cancer Unknown        family hx  . Coronary  artery disease Unknown        family hx  . Arthritis Unknown        family hx    Allergies and Medications: Allergies  Allergen Reactions  . Alendronate Other (See Comments)    GI Upset (intolerance) At risk for GI perforation  . Aspirin Diarrhea and Other (See Comments)    Melena, black tarry stools  . Aspirin-Acetaminophen-Caffeine Other (See Comments)    Melena, Dark stool  . Nsaids Other (See Comments)    Patient has ulcer   . Sulfamethoxazole Hives  . Sulfonamide Derivatives Hives  . Cephalexin Rash  . Codeine Nausea And Vomiting    GI upset   No current facility-administered medications on file  prior to encounter.    Current Outpatient Medications on File Prior to Encounter  Medication Sig Dispense Refill  . acetaminophen (TYLENOL) 325 MG tablet Take 650 mg by mouth every 4 (four) hours as needed for mild pain or moderate pain.     . Amino Acids-Protein Hydrolys (FEEDING SUPPLEMENT, PRO-STAT SUGAR FREE 64,) LIQD Take 30 mLs by mouth 2 (two) times daily.    . bisacodyl (DULCOLAX) 5 MG EC tablet Take 1 tablet (5 mg total) by mouth daily as needed for moderate constipation. 30 tablet 0  . docusate sodium (COLACE) 100 MG capsule Take 100 mg by mouth daily.    Marland Kitchen guaifenesin (ROBITUSSIN) 100 MG/5ML syrup Take 100 mg by mouth every 4 (four) hours as needed for cough.    Marland Kitchen ipratropium-albuterol (DUONEB) 0.5-2.5 (3) MG/3ML SOLN Take 3 mLs by nebulization 3 (three) times daily.    Marland Kitchen levothyroxine (SYNTHROID, LEVOTHROID) 88 MCG tablet Take 88 mcg by mouth daily before breakfast.    . LORazepam (ATIVAN) 0.5 MG tablet Take 0.5 mg by mouth at bedtime.    . magnesium hydroxide (MILK OF MAGNESIA) 400 MG/5ML suspension Take 30 mLs by mouth daily as needed for mild constipation.    . Multiple Vitamin (MULTIVITAMIN) tablet Take 1 tablet by mouth daily.    . ondansetron (ZOFRAN ODT) 4 MG disintegrating tablet Take 1 tablet (4 mg total) by mouth every 8 (eight) hours as needed for nausea or vomiting. 8 tablet 0  . ondansetron (ZOFRAN) 4 MG/2ML SOLN injection Inject 4 mg into the muscle every 6 (six) hours as needed for nausea or vomiting.    . Potassium 99 MG TABS Take 297 mg by mouth daily.    . QUEtiapine (SEROQUEL) 25 MG tablet Take 25 mg by mouth 2 (two) times daily.     Marland Kitchen senna-docusate (SENNA-PLUS) 8.6-50 MG tablet Take 1 tablet by mouth daily as needed for mild constipation.    . traMADol (ULTRAM) 50 MG tablet Take 50 mg by mouth every 6 (six) hours as needed for moderate pain.    Marland Kitchen warfarin (COUMADIN) 1 MG tablet Take 1 tablet (1 mg total) by mouth daily at 6 PM. (Patient taking differently: Take  2-4 mg by mouth See admin instructions. 2mg  by mouth once daily M,W,F - 4mg  by mouth once daily on Sun, Tues, Thu, Sat) 5 tablet 0  . amoxicillin-clavulanate (AUGMENTIN) 875-125 MG tablet Take 1 tablet by mouth 2 (two) times daily. One po bid x 7 days (Patient not taking: Reported on 04/04/2017) 14 tablet 0  . docusate sodium (COLACE) 250 MG capsule Take 1 capsule (250 mg total) by mouth daily. (Patient not taking: Reported on 04/04/2017) 10 capsule 0  . fentaNYL (DURAGESIC - DOSED MCG/HR) 25 MCG/HR patch Place 1 patch (  25 mcg total) onto the skin every 3 (three) days. (Patient not taking: Reported on 04/04/2017) 5 patch 0  . metoprolol tartrate (LOPRESSOR) 25 MG tablet TAKE ONE TABLET BY MOUTH TWICE DAILY-MUST BE SEEN (Patient not taking: Reported on 04/04/2017) 30 tablet 6  . traMADol-acetaminophen (ULTRACET) 37.5-325 MG tablet Take 1 tablet by mouth every 8 (eight) hours as needed. (Patient not taking: Reported on 04/04/2017) 15 tablet 0    Objective: BP (!) 135/94   Pulse 89   Temp 99.8 F (37.7 C) (Rectal)   Resp (!) 28   Ht 4\' 11"  (1.499 m)   Wt 103 lb (46.7 kg)   SpO2 95%   BMI 20.80 kg/m  Exam: General: frail, chronically ill female lying in bed, in NAD however  Eyes: PERRL, anicteric sclerae ENTM: poor dentition, mucous membranes tacky Cardiovascular: irregular rhythm, regular rate, 1+ pitting edema to LE bilaterally Respiratory: diffuse rhonchi, increased work of breathing on 4L [place]. Could not speak a full sentence without getting out of breath. Abdomen: soft, tender to palpation suprapubically and in RUQ, +BS. No guarding or peritoneal signs Skin: no rashes or lesions. Sacral area examined without open wounds noted. Neuro: alert. Oriented to person. Could not state time or place. Psych: mood appropriate  Labs and Imaging: CBC BMET  Recent Labs  Lab 04/04/17 1023 04/04/17 1047  WBC 11.4*  --   HGB 10.1* 11.6*  HCT 33.0* 34.0*  PLT 312  --    Recent Labs  Lab  04/04/17 1324  NA 134*  K 5.3*  CL 102  CO2 22  BUN 18  CREATININE 1.12*  GLUCOSE 154*  CALCIUM 8.4*     Dg Chest Port 1 View  Result Date: 04/04/2017 CLINICAL DATA:  Shortness of breath and hypoxia. Recent diagnosis pneumonia. EXAM: PORTABLE CHEST 1 VIEW COMPARISON:  Radiographs 09/09/2016 and 04/23/2014. Abdominal CT 09/14/2016. FINDINGS: The heart is enlarged. There is aortic atherosclerosis. There are diffuse bilateral pulmonary opacities most consistent with pulmonary edema. Moderate bilateral pleural effusions are present. There is no pneumothorax. Thoracolumbar scoliosis noted. Multiple telemetry leads overlie the chest. IMPRESSION: Cardiomegaly with edema and bilateral pleural effusions most consistent with congestive heart failure. Electronically Signed   By: Carey BullocksWilliam  Veazey M.D.   On: 04/04/2017 10:21   Ellwood Denseumball, Alison, DO 04/04/2017, 4:21 PM PGY-1, [facility] FPTS Intern pager: [phone], text pages welcome  Upper Level Addendum: I have seen and evaluated this patient along with Dr. Linwood Dibblesumball and reviewed the above note, making necessary revisions in blue.  Durward Parcelavid McMullen, DO [facility], PGY-2

## 2017-04-04 NOTE — ED Notes (Signed)
Patient is stable and ready to be transport to the floor at this time.  Report was called to 6E RN.  Belongings taken with the patient to the floor.   

## 2017-04-04 NOTE — Progress Notes (Signed)
ANTICOAGULATION CONSULT NOTE - Initial Consult  Pharmacy Consult for Warfarin Indication: atrial fibrillation  Allergies  Allergen Reactions  . Alendronate Other (See Comments)    GI Upset (intolerance) At risk for GI perforation  . Aspirin Diarrhea and Other (See Comments)    Melena, black tarry stools  . Aspirin-Acetaminophen-Caffeine Other (See Comments)    Melena, Dark stool  . Nsaids Other (See Comments)    Patient has ulcer   . Sulfamethoxazole Hives  . Sulfonamide Derivatives Hives  . Cephalexin Rash  . Codeine Nausea And Vomiting    GI upset    Patient Measurements: Height: 4\' 11"  (149.9 cm) Weight: 103 lb (46.7 kg) IBW/kg (Calculated) : 43.2  Vital Signs: Temp: 99.8 F (37.7 C) (01/28 1004) Temp Source: Rectal (01/28 1004) BP: 135/94 (01/28 1515) Pulse Rate: 89 (01/28 1515)  Labs: Recent Labs    04/04/17 1023 04/04/17 1047 04/04/17 1324  HGB 10.1* 11.6*  --   HCT 33.0* 34.0*  --   PLT 312  --   --   LABPROT 22.1*  --   --   INR 1.95  --   --   CREATININE 1.13* 1.00 1.12*  TROPONINI  --   --  5.49*    Estimated Creatinine Clearance: 21.9 mL/min (A) (by C-G formula based on SCr of 1.12 mg/dL (H)).   Medical History: Past Medical History:  Diagnosis Date  . Anxiety   . Arthritis   . Atrial fibrillation (HCC)   . Atrial fibrillation (HCC)   . Back pain   . Dysrhythmia   . GERD (gastroesophageal reflux disease)   . HLD (hyperlipidemia)   . Hypertension   . Hypothyroidism   . Insomnia   . Memory impairment   . Osteoarthritis    knee  . Osteoporosis   . PUD (peptic ulcer disease)    hx of upper GI bleeding   . Wears glasses     Assessment: 84 year old female on Warfarin for atrial fibrillation in ED for elevated troponin and CHF. Pharmacy consulted to resume Warfarin dosing.   INR in the ED is 1.95.  Home Warfarin regimen 2mg  po once daily Monday, Wednesday, and Fridays, and 4mg  po daily all other days- confirmed with [facility].   Goal of Therapy:  INR 2-3 Monitor platelets by anticoagulation protocol: Yes   Plan:  Warfarin 2mg  po x1 tonight per home regimen. Follow-up PT/INR daily.   Link SnufferJessica Merritt Mccravy, PharmD, BCPS, BCCCP Clinical Pharmacist Clinical phone 04/04/2017 until 11PM [phone] #[phone] After hours, please call [phone] 04/04/2017,4:25 PM

## 2017-04-04 NOTE — ED Provider Notes (Signed)
[facility] EMERGENCY DEPARTMENT Provider Note   CSN: [id number] Arrival date & time: 04/04/17  4098     History   Chief Complaint Chief Complaint  Patient presents with  . Shortness of Breath    HPI Amanda [name] is a 84 y.o. female.  The history is provided by the patient, the EMS personnel and a relative. No language interpreter was used.  Shortness of Breath    Amanda [name] is a 84 y.o. female who presents to the Emergency Department complaining of difficulty breathing.  Level V caveat due to dementia.  Hx is provided by EMS and family.  Per EMS she has experienced increased sob with cyanosis and sats to 70% this morning.  Per family she has experienced increased cough/congestion for several weeks with significant worsening in sob since yesterday.  She was treated with a zpack a few weeks ago for possible pna.    Past Medical History:  Diagnosis Date  . Anxiety   . Arthritis   . Atrial fibrillation (HCC)   . Atrial fibrillation (HCC)   . Back pain   . Dysrhythmia   . GERD (gastroesophageal reflux disease)   . HLD (hyperlipidemia)   . Hypertension   . Hypothyroidism   . Insomnia   . Memory impairment   . Osteoarthritis    knee  . Osteoporosis   . PUD (peptic ulcer disease)    hx of upper GI bleeding   . Wears glasses     Patient Active Problem List   Diagnosis Date Noted  . A-fib (HCC) 04/04/2017  . Atrial fibrillation (HCC) 04/04/2017  . Acute diverticulitis 09/15/2016  . Unspecified open wound, right hip, subsequent encounter 09/15/2016  . Pressure injury of skin 09/10/2016  . Thoracic compression fracture, closed, initial encounter (HCC) 09/09/2016  . Right inguinal hernia 05/14/2015  . Atrial fibrillation, persistent (HCC) 02/21/2015  . Femoral neck fracture (HCC) 04/23/2014  . Hip fracture due to osteoporosis (HCC) 04/23/2014  . Long term current use of anticoagulant therapy 10/07/2010  . Essential hypertension 07/21/2009  .  CHEST PAIN 07/11/2009  . Hypothyroidism 07/03/2009  . Hyperlipidemia 07/03/2009  . GERD 07/03/2009  . OSTEOARTHRITIS, KNEE 07/03/2009  . Intractable back pain 07/03/2009  . Osteoporosis 07/03/2009    Past Surgical History:  Procedure Laterality Date  . COLONOSCOPY    . ESOPHAGOGASTRODUODENOSCOPY    . HIP ARTHROPLASTY Right 04/25/2014   Procedure: ARTHROPLASTY BIPOLAR HIP;  Surgeon: Sheral Apley, MD;  [facility];  Service: Orthopedics;  Laterality: Right;  . INGUINAL HERNIA REPAIR Right 05/14/2015   Procedure: RIGHT INGUINAL HERNIA REPAIR WITH MESH;  Surgeon: Chevis Pretty III, MD;  Location: [facility] OR;  Service: General;  Laterality: Right;  . INSERTION OF MESH Right 05/14/2015   Procedure: INSERTION OF MESH;  Surgeon: Chevis Pretty III, MD;  Location: [facility] OR;  Service: General;  Laterality: Right;  . tumor removed from right side of face      OB History    No data available       Home Medications    Prior to Admission medications   Medication Sig Start Date End Date Taking? Authorizing Provider  acetaminophen (TYLENOL) 325 MG tablet Take 650 mg by mouth every 4 (four) hours as needed for mild pain or moderate pain.    Yes [provider]  Amino Acids-Protein Hydrolys (FEEDING SUPPLEMENT, PRO-STAT SUGAR FREE 64,) LIQD Take 30 mLs by mouth 2 (two) times daily.   Yes [provider]  bisacodyl (DULCOLAX) 5 MG EC tablet Take 1 tablet (5 mg total) by mouth daily as needed for moderate constipation. 09/15/16  Yes Dhungel, Nishant, MD  docusate sodium (COLACE) 100 MG capsule Take 100 mg by mouth daily.   Yes [provider]  guaifenesin (ROBITUSSIN) 100 MG/5ML syrup Take 100 mg by mouth every 4 (four) hours as needed for cough.   Yes [provider]  ipratropium-albuterol (DUONEB) 0.5-2.5 (3) MG/3ML SOLN Take 3 mLs by nebulization 3 (three) times daily.   Yes [provider]  levothyroxine (SYNTHROID, LEVOTHROID) 88 MCG tablet Take 88 mcg by mouth  daily before breakfast.   Yes [provider]  LORazepam (ATIVAN) 0.5 MG tablet Take 0.5 mg by mouth at bedtime.   Yes [provider]  magnesium hydroxide (MILK OF MAGNESIA) 400 MG/5ML suspension Take 30 mLs by mouth daily as needed for mild constipation.   Yes [provider]  Multiple Vitamin (MULTIVITAMIN) tablet Take 1 tablet by mouth daily.   Yes [provider]  ondansetron (ZOFRAN ODT) 4 MG disintegrating tablet Take 1 tablet (4 mg total) by mouth every 8 (eight) hours as needed for nausea or vomiting. 09/09/16  Yes Pricilla Loveless, MD  ondansetron [facility]) 4 MG/2ML SOLN injection Inject 4 mg into the muscle every 6 (six) hours as needed for nausea or vomiting.   Yes [provider]  Potassium 99 MG TABS Take 297 mg by mouth daily.   Yes [provider]  QUEtiapine (SEROQUEL) 25 MG tablet Take 25 mg by mouth 2 (two) times daily.    Yes [provider]  senna-docusate (SENNA-PLUS) 8.6-50 MG tablet Take 1 tablet by mouth daily as needed for mild constipation.   Yes [provider]  traMADol (ULTRAM) 50 MG tablet Take 50 mg by mouth every 6 (six) hours as needed for moderate pain.   Yes [provider]  warfarin (COUMADIN) 1 MG tablet Take 1 tablet (1 mg total) by mouth daily at 6 PM. Patient taking differently: Take 2-4 mg by mouth See admin instructions. 2mg  by mouth once daily M,W,F - 4mg  by mouth once daily on Sun, Tues, Thu, Sat 09/16/16  Yes Dhungel, Nishant, MD  amoxicillin-clavulanate (AUGMENTIN) 875-125 MG tablet Take 1 tablet by mouth 2 (two) times daily. One po bid x 7 days Patient not taking: Reported on 04/04/2017 09/09/16   Pricilla Loveless, MD  docusate sodium (COLACE) 250 MG capsule Take 1 capsule (250 mg total) by mouth daily. Patient not taking: Reported on 04/04/2017 07/09/16   Emi Holes, PA-C  fentaNYL (DURAGESIC - DOSED MCG/HR) 25 MCG/HR patch Place 1 patch (25 mcg total) onto the skin every 3  (three) days. Patient not taking: Reported on 04/04/2017 09/16/16   Dhungel, Theda Belfast, MD  metoprolol tartrate (LOPRESSOR) 25 MG tablet TAKE ONE TABLET BY MOUTH TWICE DAILY-MUST BE SEEN Patient not taking: Reported on 04/04/2017 06/14/13   Lars Masson, MD  traMADol-acetaminophen (ULTRACET) 37.5-325 MG tablet Take 1 tablet by mouth every 8 (eight) hours as needed. Patient not taking: Reported on 04/04/2017 09/15/16   Eddie North, MD    Family History Family History  Problem Relation Age of Onset  . Kidney cancer Unknown        family hx  . Coronary artery disease Unknown        family hx  . Arthritis Unknown        family hx    Social History Social History   Tobacco Use  .  Smoking status: Never Smoker  . Smokeless tobacco: Never Used  . Tobacco comment: tobacco use - no  Substance Use Topics  . Alcohol use: No  . Drug use: No     Allergies   Alendronate; Aspirin; Aspirin-acetaminophen-caffeine; Nsaids; Sulfamethoxazole; Sulfonamide derivatives; Cephalexin; and Codeine   Review of Systems Review of Systems  Respiratory: Positive for shortness of breath.   All other systems reviewed and are negative.    Physical Exam Updated Vital Signs BP (!) 135/94   Pulse 89   Temp 99.8 F (37.7 C) (Rectal)   Resp (!) 28   Ht 4\' 11"  (1.499 m)   Wt 46.7 kg (103 lb)   SpO2 95%   BMI 20.80 kg/m   Physical Exam  Constitutional: She appears well-developed.  Frail, thin  HENT:  Head: Normocephalic and atraumatic.  Dry mucous membranes  Cardiovascular:  No murmur heard. Tachycardic  Pulmonary/Chest:  Tachypneic, rales  Abdominal: Soft. There is no tenderness. There is no rebound and no guarding.  Musculoskeletal: She exhibits no tenderness.  2+ pitting edema to LLE, trace edema to RLE.   Neurological: She is alert.  Generalized weakness, disoriented to place and time.   Skin: Skin is warm and dry.  Psychiatric: She has a normal mood and affect. Her behavior is  normal.  Nursing note and vitals reviewed.    ED Treatments / Results  Labs (all labs ordered are listed, but only abnormal results are displayed) Labs Reviewed  COMPREHENSIVE METABOLIC PANEL - Abnormal; Notable for the following components:      Result Value   Sodium 134 (*)    Potassium 6.6 (*)    CO2 20 (*)    Glucose, Bld 165 (*)    Creatinine, Ser 1.13 (*)    Calcium 8.4 (*)    Albumin 2.6 (*)    AST 126 (*)    ALT 55 (*)    Total Bilirubin 1.7 (*)    GFR calc non Af Amer 41 (*)    GFR calc Af Amer 47 (*)    All other components within normal limits  CBC WITH DIFFERENTIAL/PLATELET - Abnormal; Notable for the following components:   WBC 11.4 (*)    RBC 3.75 (*)    Hemoglobin 10.1 (*)    HCT 33.0 (*)    RDW 16.7 (*)    Neutro Abs 9.6 (*)    All other components within normal limits  URINALYSIS, ROUTINE W REFLEX MICROSCOPIC - Abnormal; Notable for the following components:   APPearance HAZY (*)    Hgb urine dipstick SMALL (*)    Protein, ur 30 (*)    Bacteria, UA RARE (*)    Squamous Epithelial / LPF 0-5 (*)    All other components within normal limits  PROTIME-INR - Abnormal; Notable for the following components:   Prothrombin Time 22.1 (*)    All other components within normal limits  BRAIN NATRIURETIC PEPTIDE - Abnormal; Notable for the following components:   B Natriuretic Peptide 511.3 (*)    All other components within normal limits  COMPREHENSIVE METABOLIC PANEL - Abnormal; Notable for the following components:   Sodium 134 (*)    Potassium 5.3 (*)    Glucose, Bld 154 (*)    Creatinine, Ser 1.12 (*)    Calcium 8.4 (*)    Albumin 2.5 (*)    AST 106 (*)    GFR calc non Af Amer 41 (*)    GFR calc Af Amer 48 (*)  All other components within normal limits  TROPONIN I - Abnormal; Notable for the following components:   Troponin I 5.49 (*)    All other components within normal limits  I-STAT CG4 LACTIC ACID, ED - Abnormal; Notable for the following  components:   Lactic Acid, Venous 2.55 (*)    All other components within normal limits  I-STAT TROPONIN, ED - Abnormal; Notable for the following components:   Troponin i, poc 5.87 (*)    All other components within normal limits  I-STAT CHEM 8, ED - Abnormal; Notable for the following components:   Potassium 6.5 (*)    BUN 25 (*)    Glucose, Bld 173 (*)    Calcium, Ion 1.07 (*)    Hemoglobin 11.6 (*)    HCT 34.0 (*)    All other components within normal limits  CULTURE, BLOOD (ROUTINE X 2)  CULTURE, BLOOD (ROUTINE X 2)  MAGNESIUM  TSH  TROPONIN I  TROPONIN I  TROPONIN I  I-STAT ARTERIAL BLOOD GAS, ED    EKG  EKG Interpretation  Date/Time:  Monday April 04 2017 10:05:09 EST Ventricular Rate:  129 PR Interval:    QRS Duration: 85 QT Interval:  307 QTC Calculation: 448 R Axis:   -5 Text Interpretation:  Atrial fibrillation Ventricular premature complex Anteroseptal infarct, age indeterminate Baseline wander in lead(s) V3 Confirmed by Tilden Fossa [phone]) on 04/04/2017 10:07:21 AM Also confirmed by Tilden Fossa [phone]), editor Sheppard Evens ([id number])  on 04/04/2017 10:51:05 AM       Radiology Dg Chest Port 1 View  Result Date: 04/04/2017 CLINICAL DATA:  Shortness of breath and hypoxia. Recent diagnosis pneumonia. EXAM: PORTABLE CHEST 1 VIEW COMPARISON:  Radiographs 09/09/2016 and 04/23/2014. Abdominal CT 09/14/2016. FINDINGS: The heart is enlarged. There is aortic atherosclerosis. There are diffuse bilateral pulmonary opacities most consistent with pulmonary edema. Moderate bilateral pleural effusions are present. There is no pneumothorax. Thoracolumbar scoliosis noted. Multiple telemetry leads overlie the chest. IMPRESSION: Cardiomegaly with edema and bilateral pleural effusions most consistent with congestive heart failure. Electronically Signed   By: Carey Bullocks M.D.   On: 04/04/2017 10:21    Procedures Procedures (including critical care time) CRITICAL  CARE Performed by: Tilden Fossa   Total critical care time: 35 minutes  Critical care time was exclusive of separately billable procedures and treating other patients.  Critical care was necessary to treat or prevent imminent or life-threatening deterioration.  Critical care was time spent personally by me on the following activities: development of treatment plan with patient and/or surrogate as well as nursing, discussions with consultants, evaluation of patient's response to treatment, examination of patient, obtaining history from patient or surrogate, ordering and performing treatments and interventions, ordering and review of laboratory studies, ordering and review of radiographic studies, pulse oximetry and re-evaluation of patient's condition.  Medications Ordered in ED Medications  vancomycin (VANCOCIN) 500 mg in sodium chloride 0.9 % 100 mL IVPB (not administered)  ceFEPIme (MAXIPIME) 1 g in dextrose 5 % 50 mL IVPB (not administered)  levothyroxine (SYNTHROID, LEVOTHROID) tablet 88 mcg (not administered)  QUEtiapine (SEROQUEL) tablet 25 mg (not administered)  metoprolol tartrate (LOPRESSOR) tablet 25 mg (not administered)  sodium chloride flush (NS) 0.9 % injection 3 mL (not administered)  sodium chloride flush (NS) 0.9 % injection 3 mL (not administered)  0.9 %  sodium chloride infusion (not administered)  polyethylene glycol (MIRALAX / GLYCOLAX) packet 17 g (not administered)  ondansetron (ZOFRAN) tablet 4 mg (not administered)  Or  ondansetron (ZOFRAN) injection 4 mg (not administered)  vancomycin (VANCOCIN) IVPB 1000 mg/200 mL premix (0 mg Intravenous Stopped 04/04/17 1235)  ceFEPIme (MAXIPIME) 2 g in dextrose 5 % 50 mL IVPB (0 g Intravenous Stopped 04/04/17 1119)  diltiazem (CARDIZEM) 100 mg in dextrose 5% 100mL (1 mg/mL) infusion (10 mg/hr Intravenous Rate/Dose Verify 04/04/17 1520)  furosemide (LASIX) injection 20 mg (20 mg Intravenous Given 04/04/17 1100)      Initial Impression / Assessment and Plan / ED Course  I have reviewed the triage vital signs and the nursing notes.  Pertinent labs & imaging results that were available during my care of the patient were reviewed by me and considered in my medical decision making (see chart for details).     Patient here for increased shortness of breath.  Initial concern for possible pneumonia and she was treated with broad-spectrum antibiotics for H CAP.  On further assessment picture is more consistent with acute CHF with pulmonary edema and she was treated with Lasix.  Lactic acid is mildly elevated and this is not due to sepsis but due to her heart failure.  She is in rapid atrial fibrillation and was treated with Cardizem for rate control.  On repeat assessment following treatment in the department she reports feeling improved and has improved work of breathing.  Medicine service consulted for admission for further treatment.  Cardiology service consulted for recommendations regarding her acute heart failure with troponemia.  Patient and family updated of findings of studies and recommendation for admission for further treatment and they are in agreement with plan.    Final Clinical Impressions(s) / ED Diagnoses   Final diagnoses:  Rapid atrial fibrillation (HCC)  Acute congestive heart failure, unspecified heart failure type [facility](HCC)    ED Discharge Orders    None       Tilden Fossaees, Javayah Magaw, MD 04/04/17 1629

## 2017-04-04 NOTE — Consult Note (Signed)
Cardiology Consultation:   Patient ID: Amanda [name]; [id number]; 08-22-1922   Admit date: 04/04/2017 Date of Consult: 04/04/2017  Primary Care Provider: Barbie Banner, MD Primary Cardiologist: Tobias Alexander, MD    Patient Profile:   Amanda [name] is a 84 y.o. female with a hx of persistent atrial fibrillation on Coumadin for anticoagulation,  hypertension and hyperlipidemia who is being seen today for the evaluation of elevated troponin and CHF at the request of Dr. Madilyn Hook.  Last echocardiogram April 2015 showed normal LV function of 60-65%, no wall motion abnormality, mild MR, bilateral atrial enlargement.  Last seen by Dr. Delton See in December 2016 for preoperative clearance.  At that time, she was able to achieve at least 4 METs of activity.  Clear without any workup.  Patient has been declining for the past 1 year.  Currently SNF atient at [facility].  History of Present Illness:   Amanda [name] brought from nursing home facility for hypoxia and shortness of breath.  Patient was treated with antibiotics approximately 1-2 weeks ago for pneumonia.  Patient felt better for a few days while on antibiotic however progressively worsened shortness of breath with productive cough.  No hemoptysis, chest pain, syncope, dizziness, melena or blood in his stool or urine.  She denies any palpitation.  She is orthopneic.  In ER,  lactic acid 2.55.  Point-of-care troponin 5.87.  Hemoglobin 11.6.  INR 1.95.  Potassium 6.5.  Creatinine normal.  Chest x-ray showed cardiomegaly with edema and bilateral pleural effusion.  EKG shows atrial fibrillation at rate of 129 bpm-personally reviewed.  She was started on IV Cardizem with improved rate now up to 90s.  Started on IV antibiotic empirically.  Past Medical History:  Diagnosis Date  . Anxiety   . Arthritis   . Atrial fibrillation (HCC)   . Atrial fibrillation (HCC)   . Back pain   . Dysrhythmia   . GERD (gastroesophageal reflux disease)     . HLD (hyperlipidemia)   . Hypertension   . Hypothyroidism   . Insomnia   . Memory impairment   . Osteoarthritis    knee  . Osteoporosis   . PUD (peptic ulcer disease)    hx of upper GI bleeding   . Wears glasses     Past Surgical History:  Procedure Laterality Date  . COLONOSCOPY    . ESOPHAGOGASTRODUODENOSCOPY    . HIP ARTHROPLASTY Right 04/25/2014   Procedure: ARTHROPLASTY BIPOLAR HIP;  Surgeon: Sheral Apley, MD;  Location: [facility] OR;  Service: Orthopedics;  Laterality: Right;  . INGUINAL HERNIA REPAIR Right 05/14/2015   Procedure: RIGHT INGUINAL HERNIA REPAIR WITH MESH;  Surgeon: Chevis Pretty III, MD;  Location: [facility] OR;  Service: General;  Laterality: Right;  . INSERTION OF MESH Right 05/14/2015   Procedure: INSERTION OF MESH;  Surgeon: Chevis Pretty III, MD;  Location: [facility] OR;  Service: General;  Laterality: Right;  . tumor removed from right side of face       Inpatient Medications: Scheduled Meds:  Continuous Infusions: . [START ON 04/05/2017] ceFEPime (MAXIPIME) IV    . [START ON 04/05/2017] vancomycin    . vancomycin 1,000 mg (04/04/17 1135)   PRN Meds:   Allergies:    Allergies  Allergen Reactions  . Alendronate Other (See Comments)    GI Upset (intolerance) At risk for GI perforation  . Aspirin Diarrhea and Other (See Comments)    Melena, black tarry stools  . Aspirin-Acetaminophen-Caffeine Other (See Comments)  Melena, Dark stool  . Nsaids Other (See Comments)    Patient has ulcer   . Sulfamethoxazole Hives  . Sulfonamide Derivatives Hives  . Cephalexin Rash  . Codeine Nausea And Vomiting    GI upset    Social History:   Social History   Socioeconomic History  . Marital status: Married    Spouse name: Not on file  . Number of children: Not on file  . Years of education: Not on file  . Highest education level: Not on file  Social Needs  . Financial resource strain: Not on file  . Food insecurity - worry: Not on file  . Food insecurity - inability:  Not on file  . Transportation needs - medical: Not on file  . Transportation needs - non-medical: Not on file  Occupational History  . Not on file  Tobacco Use  . Smoking status: Never Smoker  . Smokeless tobacco: Never Used  . Tobacco comment: tobacco use - no  Substance and Sexual Activity  . Alcohol use: No  . Drug use: No  . Sexual activity: No  Other Topics Concern  . Not on file  Social History Narrative  . Not on file    Family History:    Family History  Problem Relation Age of Onset  . Kidney cancer Unknown        family hx  . Coronary artery disease Unknown        family hx  . Arthritis Unknown        family hx     ROS:  Please see the history of present illness.  All other ROS reviewed and negative.     Physical Exam/Data:   Vitals:   04/04/17 1004 04/04/17 1008 04/04/17 1030 04/04/17 1100  BP: (!) 145/96  130/90 (!) 127/95  Pulse: (!) 130  (!) 101   Resp: (!) 25  (!) 25 (!) 32  Temp: 99.8 F (37.7 C)     TempSrc: Rectal     SpO2: 95%     Weight:  103 lb (46.7 kg)    Height:  4\' 11"  (1.499 m)     No intake or output data in the 24 hours ending 04/04/17 1216 Filed Weights   04/04/17 1008  Weight: 103 lb (46.7 kg)   Body mass index is 20.8 kg/m.  General: Thin frail chronically ill Elderly female in no acute distress HEENT: normal Lymph: no adenopathy Neck: no JVD Endocrine:  No thryomegaly Vascular: No carotid bruits; FA pulses 2+ bilaterally without bruits  Cardiac:  normal S1, S2; RRR; no murmur  Lungs: Faint bibasilar rales  abd: soft, nontender, no hepatomegaly  Ext: 1+ edema bilaterally Musculoskeletal:  No deformities, BUE and BLE strength normal and equal Skin: warm and dry  Neuro:  CNs 2-12 intact, no focal abnormalities noted Psych:  Normal affect    Relevant CV Studies: Echocardiogram 06/2013 Study Conclusions  - Left ventricle: The cavity size was normal. Wall thickness was normal. Systolic function was normal. The  estimated ejection fraction was in the range of 60% to 65%. Indeterminant diastolic function (atrial fibrillation). Wall motion was normal; there were no regional wall motion abnormalities. - Aortic valve: There was no stenosis. Trivial regurgitation. - Mitral valve: Mildly calcified annulus. Normal thickness leaflets . Mild regurgitation. - Left atrium: The atrium was moderately dilated. - Right ventricle: The cavity size was normal. Systolic function was normal. - Right atrium: The atrium was mildly dilated. - Tricuspid valve: Peak RV-RA  gradient: 30mm Hg (S). - Pulmonary arteries: PA peak pressure: 33mm Hg (S). - Pericardium, extracardiac: Small circumferential pericardial effusion. Impressions:  - The patient appeared to be in atrial fibrillation. Normal LV size and systolic function, EF 60-65%. Normal RV size and systolic function. Mild MR. Biatrial enlargement.  Laboratory Data:  Chemistry Recent Labs  Lab 04/04/17 1047  NA 135  K 6.5*  CL 104  GLUCOSE 173*  BUN 25*  CREATININE 1.00    No results for input(s): PROT, ALBUMIN, AST, ALT, ALKPHOS, BILITOT in the last 168 hours. Hematology Recent Labs  Lab 04/04/17 1023 04/04/17 1047  WBC 11.4*  --   RBC 3.75*  --   HGB 10.1* 11.6*  HCT 33.0* 34.0*  MCV 88.0  --   MCH 26.9  --   MCHC 30.6  --   RDW 16.7*  --   PLT 312  --    Cardiac EnzymesNo results for input(s): TROPONINI in the last 168 hours.  Recent Labs  Lab 04/04/17 1041  TROPIPOC 5.87*    BNPNo results for input(s): BNP, PROBNP in the last 168 hours.  DDimer No results for input(s): DDIMER in the last 168 hours.  Radiology/Studies:  Dg Chest Port 1 View  Result Date: 04/04/2017 CLINICAL DATA:  Shortness of breath and hypoxia. Recent diagnosis pneumonia. EXAM: PORTABLE CHEST 1 VIEW COMPARISON:  Radiographs 09/09/2016 and 04/23/2014. Abdominal CT 09/14/2016. FINDINGS: The heart is enlarged. There is aortic atherosclerosis.  There are diffuse bilateral pulmonary opacities most consistent with pulmonary edema. Moderate bilateral pleural effusions are present. There is no pneumothorax. Thoracolumbar scoliosis noted. Multiple telemetry leads overlie the chest. IMPRESSION: Cardiomegaly with edema and bilateral pleural effusions most consistent with congestive heart failure. Electronically Signed   By: Carey BullocksWilliam  Veazey M.D.   On: 04/04/2017 10:21    Assessment and Plan:   1.  Volume overload -Likely acute diastolic heart failure.  BNP pending.  The patient on IV hydration for possible sepsis.  Also given IV Lasix 20 mg  x1 in ED. -Follow very closely.  2.  Atrial fibrillation with rapid ventricular rate -Rate improved on IV Cardizem.  Likely driven by acute illness.  Patient denies any palpitation or melena. - Continue home dose of metoprolol 25mg  BID, up titrate as needed. BP stable. DC cardizem.   3.  Elevated troponin -Patient denies any chest pain.  Likely demand ischemia in setting of acute illness.  Not a candidate for any evaluation given age and fragility.  4.  Failure to thrive - Patient is approaching near the end of her life. Consider hospice/palliative care.   5. Hyperkalemia - per admitting team.   Will sign off. Will be available to see her as needed.   For questions or updates, please contact [facility] Please consult [email or web address] for contact info under Cardiology/STEMI.   Lorelei PontSigned, Bhavinkumar Bhagat, [place]  04/04/2017 12:16 PM   I have personally seen and examined this patient with Chelsea AusVin Bhagat, PA. I agree with the assessment and plan as outlined above. Amanda [name] is a pleasant 84 yo female with history of atrial fib on coumadin therapy, dementia admitted with respiratory distress, hypoxemia and likely pneumonia. She has chronic persistent atrial fib and now has RVR. Troponin is 5.87. She has no chest pain. Breathing is better on supplemental oxygen therapy.  Heart rate in the 160s on admission.  Now on IV Cardizem therapy her HR is 90-110.  She has advanced dementia and per family has declined significantly over the last  year.  My exam:   General: Thin, cachectic female in NAD. She is pleasant. She is oriented to person only.   HEENT: OP clear, mucus membranes moist  SKIN: warm, dry. No rashes. Neuro: No focal deficits  Musculoskeletal: Muscle strength 5/5 all ext  Psychiatric: Mood and affect normal  Neck: No JVD, no carotid bruits, no thyromegaly, no lymphadenopathy.  Lungs:Clear bilaterally, no wheezes, rhonci, crackles Cardiovascular: Irregular, tachy.  Abdomen:Soft. Bowel sounds present. Non-tender.  Extremities: No lower extremity edema. Pulses are 2 + in the bilateral DP/PT.  1. Elevated troponin: Likely demand ischemia in the setting of rapid atrial fib and pneumonia. I would not plan an invasive evaluation given her age and dementia. Discussed with pt and family .  2. Atrial fib with RVR: HR is better controlled on IV Cardizem. Can titrate beta blocker and try to stop the IV Cardizem.   No plans for aggressive cardiac evaluation. Continue supportive care.   Verne Carrow 04/04/2017 1:05 PM

## 2017-04-04 NOTE — Progress Notes (Signed)
Pharmacy Antibiotic Note  Amanda [name] is a 84 y.o. female admitted on 04/04/2017 with pneumonia. Previously diagnosed outpatient and azithromycin started on 1/10.   Pharmacy has been consulted for vancomycin and cefepime dosing.  Allergy to cephalexin listed but has tolerated cefazolin during previous admission.  EDP aware of allergy and will monitor.  Plan: - Vancomycin load of 1 gram then 500 mg every 24 hours - Goal trough 15-20 mcg/mL  - Cefepime 2 grams x 1 then 1 gram every 24 hours  - Monitor clinical progression, renal function, and trough as needed   Temp (24hrs), Avg:99.8 F (37.7 C), Min:99.8 F (37.7 C), Max:99.8 F (37.7 C)  No results for input(s): WBC, CREATININE, LATICACIDVEN, VANCOTROUGH, VANCOPEAK, VANCORANDOM, GENTTROUGH, GENTPEAK, GENTRANDOM, TOBRATROUGH, TOBRAPEAK, TOBRARND, AMIKACINPEAK, AMIKACINTROU, AMIKACIN in the last 168 hours.  CrCl cannot be calculated (Patient's most recent lab result is older than the maximum 21 days allowed.).    Allergies  Allergen Reactions  . Alendronate Other (See Comments)    GI Upset (intolerance) At risk for GI perforation  . Aspirin Diarrhea and Other (See Comments)    Melena, black tarry stools  . Aspirin-Acetaminophen-Caffeine Other (See Comments)    Melena, Dark stool  . Nsaids Other (See Comments)    Patient has ulcer   . Sulfamethoxazole Hives  . Sulfonamide Derivatives Hives  . Cephalexin Rash  . Codeine Nausea And Vomiting    GI upset    Antimicrobials this admission: 1/28 vancomycin >>  1/28 cefepime >>   Dose adjustments this admission: N/A  Microbiology results:  1/28 BCx: x2 pending  Thank you for allowing pharmacy to be a part of this patient's care.  Harlow AsaAmber C Yopp, PharmD 04/04/2017 10:16 AM

## 2017-04-04 NOTE — ED Notes (Signed)
Pt using a Purewick 

## 2017-04-05 ENCOUNTER — Other Ambulatory Visit: Payer: Self-pay

## 2017-04-05 ENCOUNTER — Ambulatory Visit (HOSPITAL_COMMUNITY): Payer: Medicare Other

## 2017-04-05 ENCOUNTER — Encounter (HOSPITAL_COMMUNITY): Payer: Self-pay | Admitting: *Deleted

## 2017-04-05 DIAGNOSIS — E43 Unspecified severe protein-calorie malnutrition: Secondary | ICD-10-CM

## 2017-04-05 DIAGNOSIS — R531 Weakness: Secondary | ICD-10-CM

## 2017-04-05 DIAGNOSIS — I509 Heart failure, unspecified: Secondary | ICD-10-CM

## 2017-04-05 DIAGNOSIS — I361 Nonrheumatic tricuspid (valve) insufficiency: Secondary | ICD-10-CM

## 2017-04-05 DIAGNOSIS — Z7189 Other specified counseling: Secondary | ICD-10-CM

## 2017-04-05 DIAGNOSIS — Z515 Encounter for palliative care: Secondary | ICD-10-CM

## 2017-04-05 LAB — CBC
HCT: 30 % — ABNORMAL LOW (ref 36.0–46.0)
Hemoglobin: 9.5 g/dL — ABNORMAL LOW (ref 12.0–15.0)
MCH: 27.9 pg (ref 26.0–34.0)
MCHC: 31.7 g/dL (ref 30.0–36.0)
MCV: 88.2 fL (ref 78.0–100.0)
Platelets: 294 10*3/uL (ref 150–400)
RBC: 3.4 MIL/uL — ABNORMAL LOW (ref 3.87–5.11)
RDW: 17.2 % — ABNORMAL HIGH (ref 11.5–15.5)
WBC: 8.8 10*3/uL (ref 4.0–10.5)

## 2017-04-05 LAB — ECHOCARDIOGRAM COMPLETE
Height: 59 in
Weight: 1746.04 [oz_av]

## 2017-04-05 LAB — COMPREHENSIVE METABOLIC PANEL WITH GFR
ALT: 56 U/L — ABNORMAL HIGH (ref 14–54)
AST: 100 U/L — ABNORMAL HIGH (ref 15–41)
Albumin: 2.2 g/dL — ABNORMAL LOW (ref 3.5–5.0)
Alkaline Phosphatase: 95 U/L (ref 38–126)
Anion gap: 13 (ref 5–15)
BUN: 19 mg/dL (ref 6–20)
CO2: 19 mmol/L — ABNORMAL LOW (ref 22–32)
Calcium: 8.2 mg/dL — ABNORMAL LOW (ref 8.9–10.3)
Chloride: 102 mmol/L (ref 101–111)
Creatinine, Ser: 1.01 mg/dL — ABNORMAL HIGH (ref 0.44–1.00)
GFR calc Af Amer: 54 mL/min — ABNORMAL LOW
GFR calc non Af Amer: 47 mL/min — ABNORMAL LOW
Glucose, Bld: 112 mg/dL — ABNORMAL HIGH (ref 65–99)
Potassium: 4.7 mmol/L (ref 3.5–5.1)
Sodium: 134 mmol/L — ABNORMAL LOW (ref 135–145)
Total Bilirubin: 0.7 mg/dL (ref 0.3–1.2)
Total Protein: 6.2 g/dL — ABNORMAL LOW (ref 6.5–8.1)

## 2017-04-05 LAB — PROTIME-INR
INR: 2.12
Prothrombin Time: 23.6 s — ABNORMAL HIGH (ref 11.4–15.2)

## 2017-04-05 LAB — MRSA PCR SCREENING: MRSA by PCR: POSITIVE — AB

## 2017-04-05 LAB — TROPONIN I: Troponin I: 4.17 ng/mL

## 2017-04-05 MED ORDER — MUPIROCIN 2 % EX OINT
1.0000 "application " | TOPICAL_OINTMENT | Freq: Two times a day (BID) | CUTANEOUS | Status: DC
  Administered 2017-04-05 – 2017-04-07 (×5): 1 via NASAL
  Filled 2017-04-05: qty 22

## 2017-04-05 MED ORDER — OFF THE BEAT BOOK
Freq: Once | Status: DC
  Filled 2017-04-05: qty 1

## 2017-04-05 MED ORDER — GUAIFENESIN ER 600 MG PO TB12
600.0000 mg | ORAL_TABLET | Freq: Two times a day (BID) | ORAL | Status: DC | PRN
  Administered 2017-04-06: 600 mg via ORAL
  Filled 2017-04-05: qty 1

## 2017-04-05 MED ORDER — CHLORHEXIDINE GLUCONATE CLOTH 2 % EX PADS
6.0000 | MEDICATED_PAD | Freq: Every day | CUTANEOUS | Status: DC
  Administered 2017-04-07: 6 via TOPICAL

## 2017-04-05 MED ORDER — WARFARIN SODIUM 4 MG PO TABS
4.0000 mg | ORAL_TABLET | Freq: Once | ORAL | Status: AC
  Administered 2017-04-05: 4 mg via ORAL
  Filled 2017-04-05: qty 1

## 2017-04-05 MED ORDER — FUROSEMIDE 10 MG/ML IJ SOLN
20.0000 mg | Freq: Once | INTRAMUSCULAR | Status: AC
  Administered 2017-04-05: 20 mg via INTRAVENOUS
  Filled 2017-04-05: qty 2

## 2017-04-05 NOTE — Evaluation (Signed)
Occupational Therapy Evaluation Patient Details Name: Amanda [name] MRN: [id number] DOB: 11/23/1922 Today's Date: 04/05/2017    History of Present Illness Pt is a 84 y/o female presenting with cough and worsening SOB for the past few days.      During transport EMS noted hypoxia in the 70's.  Recent treatment for CAP, now CXR concerning for acute worsening of HF c/w cardiomegaly and bilateral pleural effusions.  Also noted elevated troponins suspected due to demand ischemia from afib with RVR.  PMH significant for afib, dementia, HTN, osteoporosis.   Clinical Impression   [facility] for [facility] but is able to assist with transfers to and from wheelchair. Pt currently requiring overall max assist for LB ADL and toilet transfers and total assistance for toileting hygiene. Pt confused throughout session and this is likely baseline as pt has a history of dementia. She presents to OT with decreased activity tolerance for ADL as well as generalized weakness. Feel pt would benefit from continued OT services while admitted to improve independence and safety with ADL and functional mobility in preparation for return to SNF with continued rehabilitation. Will continue to follow while admitted.     Follow Up Recommendations  SNF;Supervision/Assistance - 24 hour    Equipment Recommendations  Other (comment)(defer to next venue of care)    Recommendations for Other Services       Precautions / Restrictions Precautions Precautions: Fall      Mobility Bed Mobility Overal bed mobility: Needs Assistance Bed Mobility: Supine to Sit     Supine to sit: Total assist     General bed mobility comments: OOB in chair on my arrival.   Transfers Overall transfer level: Needs assistance   Transfers: Sit to/from Stand Sit to Stand: Max assist   Squat pivot transfers: Total assist;+2 safety/equipment     General transfer comment: Max face to face assist to rise to  standing.     Balance Overall balance assessment: Needs assistance Sitting-balance support: Bilateral upper extremity supported;Feet supported Sitting balance-Leahy Scale: Poor Sitting balance - Comments: Relies on B UE support at edge of chair.    Standing balance support: No upper extremity supported;Bilateral upper extremity supported Standing balance-Leahy Scale: Zero Standing balance comment: pt attempted to assist transfer, but assist was negligble.                           ADL either performed or assessed with clinical judgement   ADL Overall ADL's : Needs assistance/impaired Eating/Feeding: Supervision/ safety;Sitting   Grooming: Supervision/safety;Sitting;Cueing for UE precautions;Wash/dry face;Applying deodorant Grooming Details (indicate cue type and reason): Requiring verbal cues for applying deoderant as pt attempting to apply deoderant on sleeve of her gown.  Upper Body Bathing: Minimal assistance;Sitting   Lower Body Bathing: Maximal assistance;Sitting/lateral leans   Upper Body Dressing : Minimal assistance;Sitting   Lower Body Dressing: Maximal assistance;Sitting/lateral leans   Toilet Transfer: Maximal assistance;Stand-pivot   Toileting- Clothing Manipulation and Hygiene: Total assistance;Sit to/from stand         General ADL Comments: Pt limited this session by weakness and pain in L side.     Vision   Additional Comments: Able to read label on deoderant bottle.      Perception     Praxis      Pertinent Vitals/Pain       Hand Dominance     Extremity/Trunk Assessment Upper Extremity Assessment Upper Extremity Assessment: Generalized  weakness   Lower Extremity Assessment Lower Extremity Assessment: Generalized weakness   Cervical / Trunk Assessment Cervical / Trunk Assessment: Kyphotic   Communication Communication Communication: HOH   Cognition Arousal/Alertness: Awake/alert Behavior During Therapy: WFL for tasks  assessed/performed Overall Cognitive Status: History of cognitive impairments - at baseline                                 General Comments: Noted short-term memory and decreased processing skills.    General Comments  Poor wave form on monitor but SpO2 desaturation to 70s at times with rebound to 90s on 2 L O2.    Exercises     Shoulder Instructions      Home Living Family/patient expects to be discharged to:: Skilled nursing facility                                 Additional Comments: Has been at SNF       Prior Functioning/Environment Level of Independence: Needs assistance  Gait / Transfers Assistance Needed: Pt able to assist with transfers. Uses w/c for all mobility.  ADL's / Homemaking Assistance Needed: Assist for all ADL   Comments: In SNF for ~ 6 months.  Staff assists with all ADL's incl bathing, dressing.  pt does not walk, but can assist transfering to a w/c.  She is assisted to the commode for toileting.        OT Problem List: Decreased strength;Decreased range of motion;Decreased activity tolerance;Impaired balance (sitting and/or standing);Decreased safety awareness;Decreased knowledge of use of DME or AE;Decreased knowledge of precautions;Pain      OT Treatment/Interventions: Self-care/ADL training;Therapeutic exercise;Energy conservation;DME and/or AE instruction;Therapeutic activities;Patient/family education;Cognitive remediation/compensation;Balance training    OT Goals(Current goals can be found in the care plan section) Acute Rehab OT Goals Patient Stated Goal: pt unable,  family hope transfers are easier OT Goal Formulation: With patient Time For Goal Achievement: 04/19/17 Potential to Achieve Goals: Good  OT Frequency: Min 1X/week   Barriers to D/C:            Co-evaluation              AM-PAC PT "6 Clicks" Daily Activity     Outcome Measure Help from another person eating meals?: A Little Help from  another person taking care of personal grooming?: A Little Help from another person toileting, which includes using toliet, bedpan, or urinal?: Total Help from another person bathing (including washing, rinsing, drying)?: A Lot Help from another person to put on and taking off regular upper body clothing?: A Lot Help from another person to put on and taking off regular lower body clothing?: A Lot 6 Click Score: 13   End of Session Equipment Utilized During Treatment: Gait belt;Oxygen Nurse Communication: Mobility status  Activity Tolerance: Patient tolerated treatment well Patient left: in chair;with call bell/phone within reach  OT Visit Diagnosis: Muscle weakness (generalized) (M62.81);Other symptoms and signs involving cognitive function;Other abnormalities of gait and mobility (R26.89)                Time: 4098-1191 OT Time Calculation (min): 28 min Charges:  OT General Charges $OT Visit: 1 Visit OT Evaluation $OT Eval Moderate Complexity: 1 Mod OT Treatments $Self Care/Home Management : 8-22 mins G-Codes:     Doristine Section, MS OTR/L  Pager: [phone]   Kevonte Vanecek A Liberato Stansbery 04/05/2017,  4:47 PM

## 2017-04-05 NOTE — Progress Notes (Signed)
ANTICOAGULATION CONSULT NOTE - Follow Up Consult  Pharmacy Consult for Coumadin Indication: atrial fibrillation  Allergies  Allergen Reactions  . Alendronate Other (See Comments)    GI Upset (intolerance) At risk for GI perforation  . Aspirin Diarrhea and Other (See Comments)    Melena, black tarry stools  . Aspirin-Acetaminophen-Caffeine Other (See Comments)    Melena, Dark stool  . Nsaids Other (See Comments)    Patient has ulcer   . Sulfamethoxazole Hives  . Sulfonamide Derivatives Hives  . Cephalexin Rash  . Codeine Nausea And Vomiting    GI upset    Patient Measurements: Height: 4\' 11"  (149.9 cm) Weight: 109 lb 2 oz (49.5 kg) IBW/kg (Calculated) : 43.2  Vital Signs: Temp: 97.5 F (36.4 C) (01/29 1212) Temp Source: Axillary (01/29 1212) BP: 113/64 (01/29 1212) Pulse Rate: 73 (01/29 0819)  Labs: Recent Labs    04/04/17 1023 04/04/17 1047  04/04/17 1324 04/04/17 1621 04/04/17 2154 04/05/17 0406  HGB 10.1* 11.6*  --   --   --   --  9.5*  HCT 33.0* 34.0*  --   --   --   --  30.0*  PLT 312  --   --   --   --   --  294  LABPROT 22.1*  --   --   --   --   --  23.6*  INR 1.95  --   --   --   --   --  2.12  CREATININE 1.13* 1.00  --  1.12*  --   --  1.01*  TROPONINI  --   --    < > 5.49* 4.43* 5.68* 4.17*   < > = values in this interval not displayed.    Estimated Creatinine Clearance: 24.2 mL/min (A) (by C-G formula based on SCr of 1.01 mg/dL (H)).  Assessment:  84 yr old female continues on Coumadin as prior to admission for atrial fibrillation.   INR is therapeutic, 2.12.    Most recent Coumadin regimen: 2 mg MWF, 4 mg TTSS.  Since 03/28/17.   [facility] multiple regimen changes over the last month, including Vitamin K 5 mg PO given on 1/4 and 03/13/17.  Goal of Therapy:  INR 2-3 Monitor platelets by anticoagulation protocol: Yes   Plan:   Coumadin 4 mg x 1 today, per current regimen.  Daily PT/INR for now.  Dennie Fettersgan,  Zahki Hoogendoorn Donovan, [place] Pager: [phone] 04/05/2017,12:19 PM

## 2017-04-05 NOTE — Progress Notes (Signed)
Progress Note  Patient Name: Amanda [name] Date of Encounter: 04/05/2017  Primary Cardiologist: Tobias Alexander, MD   Subjective   Pt comfortable. Denies chest pain or shortness of breath at rest. Limited history secondary to dementia.  Inpatient Medications    Scheduled Meds: . Chlorhexidine Gluconate Cloth  6 each Topical Q0600  . feeding supplement (ENSURE ENLIVE)  237 mL Oral BID BM  . levothyroxine  88 mcg Oral QAC breakfast  . metoprolol tartrate  25 mg Oral BID  . mupirocin ointment  1 application Nasal BID  . off the beat book   Does not apply Once  . QUEtiapine  25 mg Oral BID  . sodium chloride flush  3 mL Intravenous Q12H  . Warfarin - Pharmacist Dosing Inpatient   Does not apply q1800   Continuous Infusions: . sodium chloride    . ceFEPime (MAXIPIME) IV    . diltiazem (CARDIZEM) infusion Stopped (04/05/17 0255)  . vancomycin     PRN Meds: sodium chloride, ondansetron **OR** ondansetron (ZOFRAN) IV, polyethylene glycol, sodium chloride flush   Vital Signs    Vitals:   04/04/17 2028 04/05/17 0014 04/05/17 0445 04/05/17 0820  BP: 137/64 107/78 98/67 113/68  Pulse: (!) 116  69   Resp: (!) 25  20   Temp: 98.6 F (37 C) 98.6 F (37 C) 98.4 F (36.9 C) 98 F (36.7 C)  TempSrc: Axillary Axillary Axillary Axillary  SpO2: 100%  100% 99%  Weight:   109 lb 2 oz (49.5 kg)   Height:        Intake/Output Summary (Last 24 hours) at 04/05/2017 0914 Last data filed at 04/05/2017 0200 Gross per 24 hour  Intake 143.25 ml  Output 300 ml  Net -156.75 ml   Filed Weights   04/04/17 1008 04/05/17 0445  Weight: 103 lb (46.7 kg) 109 lb 2 oz (49.5 kg)    Telemetry    Atrial fibrillation, HR 60-90 bpm - Personally Reviewed   Physical Exam  Elderly, pleasant woman, NAD, not oriented but answers simple questions appropriately GEN: No acute distress.   Neck: No JVD Cardiac: irregular, no murmurs, rubs, or gallops.  Respiratory: diminished in the bases GI:  Soft, nontender, non-distended  MS: 1+ bilateral pretibial edema; No deformity. Neuro:  Nonfocal  Psych: Normal affect   Labs    Chemistry Recent Labs  Lab 04/04/17 1023 04/04/17 1047 04/04/17 1324 04/05/17 0406  NA 134* 135 134* 134*  K 6.6* 6.5* 5.3* 4.7  CL 102 104 102 102  CO2 20*  --  22 19*  GLUCOSE 165* 173* 154* 112*  BUN 19 25* 18 19  CREATININE 1.13* 1.00 1.12* 1.01*  CALCIUM 8.4*  --  8.4* 8.2*  PROT 7.4  --  7.3 6.2*  ALBUMIN 2.6*  --  2.5* 2.2*  AST 126*  --  106* 100*  ALT 55*  --  51 56*  ALKPHOS 118  --  114 95  BILITOT 1.7*  --  0.7 0.7  GFRNONAA 41*  --  41* 47*  GFRAA 47*  --  48* 54*  ANIONGAP 12  --  10 13     Hematology Recent Labs  Lab 04/04/17 1023 04/04/17 1047 04/05/17 0406  WBC 11.4*  --  8.8  RBC 3.75*  --  3.40*  HGB 10.1* 11.6* 9.5*  HCT 33.0* 34.0* 30.0*  MCV 88.0  --  88.2  MCH 26.9  --  27.9  MCHC 30.6  --  31.7  RDW 16.7*  --  17.2*  PLT 312  --  294    Cardiac Enzymes Recent Labs  Lab 04/04/17 1324 04/04/17 1621 04/04/17 2154 04/05/17 0406  TROPONINI 5.49* 4.43* 5.68* 4.17*    Recent Labs  Lab 04/04/17 1041  TROPIPOC 5.87*     BNP Recent Labs  Lab 04/04/17 1023  BNP 511.3*     DDimer No results for input(s): DDIMER in the last 168 hours.   Radiology    Dg Chest Port 1 View  Result Date: 04/04/2017 CLINICAL DATA:  Shortness of breath and hypoxia. Recent diagnosis pneumonia. EXAM: PORTABLE CHEST 1 VIEW COMPARISON:  Radiographs 09/09/2016 and 04/23/2014. Abdominal CT 09/14/2016. FINDINGS: The heart is enlarged. There is aortic atherosclerosis. There are diffuse bilateral pulmonary opacities most consistent with pulmonary edema. Moderate bilateral pleural effusions are present. There is no pneumothorax. Thoracolumbar scoliosis noted. Multiple telemetry leads overlie the chest. IMPRESSION: Cardiomegaly with edema and bilateral pleural effusions most consistent with congestive heart failure. Electronically  Signed   By: Carey BullocksWilliam  Veazey M.D.   On: 04/04/2017 10:21    Cardiac Studies   2D echo pending  Patient Profile     84 y.o. female with permanent atrial fibrillation on warfarin presenting with acute respiratory failure and signs of acute on chronic diastolic heart failure  Assessment & Plan    1. Acute on chronic heart failure, likely diastolic (LVEF preserved by 2015 echo): BL pleural effusion and cardiomegaly on CXR 2. Atrial fibrillation with RVR, now with controlled heart rate on oral metoprolol. IV diltiazem stopped. On warfarin for anticoagulation. Continue low-dose beta-blocker. 3. Demand ischemia: significant troponin elevation without any clear symptoms of ACS  Echo pending, recommend single dose of IV lasix 20 mg today as I suspect CHF is playing a significant role in her symptoms at presentation and weight is up since admission, leg edema present. Otherwise continue current management.  For questions or updates, please contact [facility] Please consult [email or web address] for contact info under Cardiology/STEMI.      Signed, Tonny BollmanMichael Shannelle Alguire, MD  04/05/2017, 9:14 AM

## 2017-04-05 NOTE — Consult Note (Signed)
Consultation Note Date: 04/05/2017   Patient Name: Amanda [name]  DOB: 1922-04-25  MRN: [id number]  Age / Sex: 84 y.o., female  PCP: Christain Sacramento, MD Referring Physician: Lind Covert, MD  Reason for Consultation: Establishing goals of care  HPI/Patient Profile: 84 y.o. female admitted on 04/04/2017 with shortness of breath and cough with a PMH of A-fibb, dementia, hypothyroidism, HTN, GERD,  HLD, and osteoporosis. She was treated a week ago outpatient with Z-pack for CAP. She is a resident at [facility] for the past 6 months and prior to that she was at [facility] due to a hip fx and rehab. Patient is wheelchair bound. During this visit she also was found to be in Afib with RVR with HR in the low 100s and troponin of 5.87. EKG without significant ST changes. Cardiology has been following her.   Patient and family reports physical and cognitive decline over the past 6 months.   Palliative medicine consulted per Family Medicine Team (Dr. Yisroel Ramming) for [place] discussion.  Clinical Assessment and Goals of Care: I have reviewed medical records including EPIC notes, labs and imaging, received report from nurse, assessed the patient and then met at the bedside along with Wadie Lessen  NP (PMT), son Amanda [name]) and Mr. Kesselman (who has dementia and not a great historian),  to discuss diagnosis prognosis, [place], EOL wishes, disposition and options.  I introduced Palliative Medicine as specialized medical care for people living with serious illness. It focuses on providing relief from the symptoms and stress of a serious illness. The goal is to improve quality of life for both the patient and the family.  We discussed a brief life review of the patient. 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 She was well known in their community and was a loving free-spirited woman. He did  not speak much on spiritual beliefs. He stated that he and his sister has noticed a decline in her health over the past 6-8 months. She has a granddaughter, and outside of their immediate family Amanda [name] reports they are each others support system.   As far as functional and nutritional status she has declined over the last several months, and moreso after being at [facility] Per the son she never recovered from the hip injury and her mobility hans decreased and her mental status continues to decline due to the dementia. He reports she walked into [facility] with a walker but unable to do this as much these days. Patient would only take in small amounts of food or water with direct care prompting her. Son states she has loss weight in the past few months as well.   We discussed her current illness and what it means in the larger context of her on-going co-morbidities.  Natural disease trajectory and expectations at EOL were discussed. I attempted to elicit values and goals of care important to the patient.    The difference between aggressive medical intervention and  comfort care was considered in light of the patient's goals of care. However, the son was very direct as in he has not thought that far ahead, and "he just wants her to get better so he can keep her as long as he can.". He became very tearful and verbalized this conversation has caused him to think about the her health status and future states. During the conversation Amanda [name] was alert to her son crying and she reached out her hand as he held hers and she stated to him "It is ok you have been a great son" she also replied to her husband "you have been a great husband and she hs been a great daughter." The son acknowledged her comments and remained tearful. She replied "I have tried to love you all the best I can with all my heart. I will stay here as long as my body lets me. It is ok."   Advanced directives, concepts specific to code  status, artifical feeding and hydration, and rehospitalization were considered and discussed. At this point son has verbalized he is 62 and his niece is gathering all paperwork to bring to hospital. He is not familiar with any advance wishes that his mom had made prior to her illness. He would like to continue to treat the treatable and hope for the best. He is in agreement to continue to go one day at a time and further assesses the situation have a conversation with his sister as they began thinking of goals of care for his mother.   Hospice and Palliative Care services outpatient were explained.   Questions and concerns were addressed. The family was encouraged to call with questions or concerns. Palliative will continue to follow during this admission and offer assistance.   Primary Decision Maker HCPOA-Son Amanda [name])     SUMMARY OF RECOMMENDATIONS    Continue with attending provider's current plan to treat the treatable and hope for the best per son Amanda [name].   Allow son to discuss initial conversation with sister and follow back up to see if they have any additional questions/concerns. Will f/u tomorrow.   Will continue to follow patient/family for palliative needs and resources  Code Status/Advance Care Planning:  Full code  Symptom Management:   Weakness-continue to work with PT and transition back to SNF for further rehab as tolerated  Palliative Prophylaxis:   Aspiration, Delirium Protocol, Frequent Pain Assessment and Oral Care  Additional Recommendations (Limitations, Scope, Preferences):  Full Scope Treatment  Psycho-social/Spiritual:   Desire for further Chaplaincy support:no  Additional Recommendations: Caregiving  Support/Resources and Education on Hospice  Prognosis:   Unable to determine-long term poor prognosis   Discharge Planning: To Be Determined      Primary Diagnoses: Present on Admission: . A-fib [facility])   I have reviewed the medical  record, interviewed the patient and family, and examined the patient. The following aspects are pertinent.  Past Medical History:  Diagnosis Date  . Anxiety   . Arthritis   . Atrial fibrillation ([place])   . Atrial fibrillation ([place])   . Back pain   . Dysrhythmia   . GERD (gastroesophageal reflux disease)   . HLD (hyperlipidemia)   . Hypertension   . Hypothyroidism   . Insomnia   . Memory impairment   . Osteoarthritis    knee  . Osteoporosis   . PUD (peptic ulcer disease)    hx of upper GI bleeding   . Wears glasses    Social History  Socioeconomic History  . Marital status: Married    Spouse name: Not on file  . Number of children: Not on file  . Years of education: Not on file  . Highest education level: Not on file  Social Needs  . Financial resource strain: Not on file  . Food insecurity - worry: Not on file  . Food insecurity - inability: Not on file  . Transportation needs - medical: Not on file  . Transportation needs - non-medical: Not on file  Occupational History  . Not on file  Tobacco Use  . Smoking status: Never Smoker  . Smokeless tobacco: Never Used  . Tobacco comment: tobacco use - no  Substance and Sexual Activity  . Alcohol use: No  . Drug use: No  . Sexual activity: No  Other Topics Concern  . Not on file  Social History Narrative  . Not on file   Family History  Problem Relation Age of Onset  . Kidney cancer Unknown        family hx  . Coronary artery disease Unknown        family hx  . Arthritis Unknown        family hx   Scheduled Meds: . Chlorhexidine Gluconate Cloth  6 each Topical Q0600  . feeding supplement (ENSURE ENLIVE)  237 mL Oral BID BM  . levothyroxine  88 mcg Oral QAC breakfast  . metoprolol tartrate  25 mg Oral BID  . mupirocin ointment  1 application Nasal BID  . off the beat book   Does not apply Once  . QUEtiapine  25 mg Oral BID  . sodium chloride flush  3 mL Intravenous Q12H  . warfarin  4 mg Oral ONCE-1800    . Warfarin - Pharmacist Dosing Inpatient   Does not apply q1800   Continuous Infusions: . sodium chloride    . diltiazem (CARDIZEM) infusion Stopped (04/05/17 0255)   PRN Meds:.sodium chloride, ondansetron **OR** ondansetron (ZOFRAN) IV, polyethylene glycol, sodium chloride flush Medications Prior to Admission:  Prior to Admission medications   Medication Sig Start Date End Date Taking? Authorizing Provider  acetaminophen (TYLENOL) 325 MG tablet Take 650 mg by mouth every 4 (four) hours as needed for mild pain or moderate pain.    Yes [provider]  Amino Acids-Protein Hydrolys (FEEDING SUPPLEMENT, PRO-STAT SUGAR FREE 64,) LIQD Take 30 mLs by mouth 2 (two) times daily.   Yes [provider]  bisacodyl (DULCOLAX) 5 MG EC tablet Take 1 tablet (5 mg total) by mouth daily as needed for moderate constipation. 09/15/16  Yes Dhungel, Nishant, MD  docusate sodium (COLACE) 100 MG capsule Take 100 mg by mouth daily.   Yes [provider]  guaifenesin (ROBITUSSIN) 100 MG/5ML syrup Take 100 mg by mouth every 4 (four) hours as needed for cough.   Yes [provider]  ipratropium-albuterol (DUONEB) 0.5-2.5 (3) MG/3ML SOLN Take 3 mLs by nebulization 3 (three) times daily.   Yes [provider]  levothyroxine (SYNTHROID, LEVOTHROID) 88 MCG tablet Take 88 mcg by mouth daily before breakfast.   Yes [provider]  LORazepam (ATIVAN) 0.5 MG tablet Take 0.5 mg by mouth at bedtime.   Yes [provider]  magnesium hydroxide (MILK OF MAGNESIA) 400 MG/5ML suspension Take 30 mLs by mouth daily as needed for mild constipation.   Yes [provider]  Multiple Vitamin (MULTIVITAMIN) tablet Take 1 tablet by mouth daily.   Yes [provider]  ondansetron (ZOFRAN ODT)  4 MG disintegrating tablet Take 1 tablet (4 mg total) by mouth every 8 (eight) hours as needed for nausea or vomiting. 09/09/16  Yes Sherwood Gambler, MD  ondansetron [facility]) 4  MG/2ML SOLN injection Inject 4 mg into the muscle every 6 (six) hours as needed for nausea or vomiting.   Yes [provider]  Potassium 99 MG TABS Take 297 mg by mouth daily.   Yes [provider]  QUEtiapine (SEROQUEL) 25 MG tablet Take 25 mg by mouth 2 (two) times daily.    Yes [provider]  senna-docusate (SENNA-PLUS) 8.6-50 MG tablet Take 1 tablet by mouth daily as needed for mild constipation.   Yes [provider]  traMADol (ULTRAM) 50 MG tablet Take 50 mg by mouth every 6 (six) hours as needed for moderate pain.   Yes [provider]  warfarin (COUMADIN) 1 MG tablet Take 1 tablet (1 mg total) by mouth daily at 6 PM. Patient taking differently: Take 2-4 mg by mouth See admin instructions. 38m by mouth once daily M,W,F - 432mby mouth once daily on Sun, Tues, Thu, Sat 09/16/16  Yes Dhungel, Nishant, MD  amoxicillin-clavulanate (AUGMENTIN) 875-125 MG tablet Take 1 tablet by mouth 2 (two) times daily. One po bid x 7 days Patient not taking: Reported on 04/04/2017 09/09/16   GoSherwood GamblerMD  docusate sodium (COLACE) 250 MG capsule Take 1 capsule (250 mg total) by mouth daily. Patient not taking: Reported on 04/04/2017 07/09/16   LaFrederica KusterPA-C  fentaNYL (DURAGESIC - DOSED MCG/HR) 25 MCG/HR patch Place 1 patch (25 mcg total) onto the skin every 3 (three) days. Patient not taking: Reported on 04/04/2017 09/16/16   Dhungel, NiFlonnie OvermanMD  metoprolol tartrate (LOPRESSOR) 25 MG tablet TAKE ONE TABLET BY MOUTH TWICE DAILY-MUST BE SEEN Patient not taking: Reported on 04/04/2017 06/14/13   NeDorothy SparkMD  traMADol-acetaminophen (ULTRACET) 37.5-325 MG tablet Take 1 tablet by mouth every 8 (eight) hours as needed. Patient not taking: Reported on 04/04/2017 09/15/16   DhLouellen MolderMD   Allergies  Allergen Reactions  . Alendronate Other (See Comments)    GI Upset (intolerance) At risk for GI perforation  . Aspirin Diarrhea and Other (See Comments)     Melena, black tarry stools  . Aspirin-Acetaminophen-Caffeine Other (See Comments)    Melena, Dark stool  . Nsaids Other (See Comments)    Patient has ulcer   . Sulfamethoxazole Hives  . Sulfonamide Derivatives Hives  . Cephalexin Rash  . Codeine Nausea And Vomiting    GI upset   Review of Systems  Unable to perform ROS: Dementia    Physical Exam  Constitutional: She appears ill.  Neurological: She is alert. She is disoriented.  Psychiatric: Cognition and memory are impaired.    Vital Signs: BP 113/64 (BP Location: Right Arm)   Pulse 73   Temp (!) 97.5 F (36.4 C) (Axillary)   Resp 20   Ht _0  (1.499 m)   Wt 49.5 kg (109 lb 2 oz)   SpO2 96%   BMI 22.04 kg/m  Pain Assessment: No/denies pain POSS *See Group Information*: S-Acceptable,Sleep, easy to arouse     SpO2: SpO2: 96 % O2 Device:SpO2: 96 % O2 Flow Rate: .O2 Flow Rate (L/min): 2 L/min  IO: Intake/output summary:   Intake/Output Summary (Last 24 hours) at 04/05/2017 1343 Last data filed at 04/05/2017 0200 Gross per 24 hour  Intake 143.25 ml  Output 300 ml  Net -156.75 ml  LBM: Last BM Date: (pt unsure) Baseline Weight: Weight: 46.7 kg (103 lb) Most recent weight: Weight: 49.5 kg (109 lb 2 oz)     Palliative Assessment/Data: PPS  40%   Discussed with bedside RN and SW.   Time In: 1050 Time Out: 1200 Time Total: 70 min Greater than 50%  of this time was spent counseling and coordinating care related to the above assessment and plan.  Signed by: Alda Lea, AGNP-C Palliative Medicine Team  Phone: [phone] Fax: [phone]   Please contact Palliative Medicine Team phone at [phone] for questions and concerns.  For individual provider: See Polly Cobia NP agree with above note, present for entire visit and assessment

## 2017-04-05 NOTE — Evaluation (Signed)
Physical Therapy Evaluation Patient Details Name: Amanda [name] MRN: [id number] DOB: [birth year] Today's Date: 04/05/2017   History of Present Illness  pt is a 84 y/o female presenting with cough and worsening SOB for the past few days.      During transport EMS noted hypoxia in the 70's.  Recent treatment for CAP, now CXR concerning for acute worsening of HF c/w cardiomegaly and bilateral pleural effusions.  Also noted elevated troponins suspected due to demand ischemia from afib with RVR.  PMH significant for afib, dementia, HTN, osteoporosis.  Clinical Impression  Pt admitted with/for worsening HF and pleural effusions.  Pt needs total assist for all mobility, though she is a slight lady so it is not difficult.  Pt currently limited functionally due to the problems listed. ( See problems list.)   Pt will benefit from PT to maximize function and safety in order to get ready for next venue listed below.     Follow Up Recommendations No PT follow up;Other (comment)(nursing continuing restorative.)    Equipment Recommendations  None recommended by PT    Recommendations for Other Services       Precautions / Restrictions Precautions Precautions: Fall      Mobility  Bed Mobility Overal bed mobility: Needs Assistance Bed Mobility: Supine to Sit     Supine to sit: Total assist     General bed mobility comments: Assisted slowly to give pt extra time to assist.  She did try to help, but was still total assist  Transfers Overall transfer level: Needs assistance   Transfers: Squat Pivot Transfers;Sit to/from Stand Sit to Stand: Total assist;+2 safety/equipment   Squat pivot transfers: Total assist;+2 safety/equipment     General transfer comment: face to face assist, bracing pt's feet with pt offering very little assist.  Ambulation/Gait             General Gait Details: pt unable  Stairs            Wheelchair Mobility    Modified Rankin (Stroke Patients  Only)       Balance Overall balance assessment: Needs assistance Sitting-balance support: Bilateral upper extremity supported;Feet supported Sitting balance-Leahy Scale: Poor Sitting balance - Comments: pt unable to maintain sitting EOB with both UE's without moderate assist.     Standing balance-Leahy Scale: Zero Standing balance comment: pt attempted to assist transfer, but assist was negligble.                             Pertinent Vitals/Pain      Home Living Family/patient expects to be discharged to:: Skilled nursing facility                      Prior Function Level of Independence: Needs assistance         Comments: In SNF for ~ 6 months.  Staff assists with all ADL's incl bathing, dressing.  pt does not walk, but can assist transfering to a w/c.  She is assisted to the commode for toileting.     Hand Dominance        Extremity/Trunk Assessment   Upper Extremity Assessment Upper Extremity Assessment: Defer to OT evaluation    Lower Extremity Assessment Lower Extremity Assessment: Generalized weakness       Communication   Communication: HOH  Cognition Arousal/Alertness: Awake/alert Behavior During Therapy: WFL for tasks assessed/performed Overall Cognitive Status: History of cognitive impairments - at baseline  General Comments      Exercises     Assessment/Plan    PT Assessment Patient needs continued PT services  PT Problem List Decreased strength;Decreased activity tolerance;Decreased balance;Decreased mobility;Cardiopulmonary status limiting activity       PT Treatment Interventions DME instruction;Functional mobility training;Therapeutic activities;Balance training;Patient/family education;Therapeutic exercise    PT Goals (Current goals can be found in the Care Plan section)  Acute Rehab PT Goals Patient Stated Goal: pt unable,  family hope transfers are  easier PT Goal Formulation: With patient/family Time For Goal Achievement: 04/12/17 Potential to Achieve Goals: Fair    Frequency Min 2X/week   Barriers to discharge        Co-evaluation               AM-PAC PT "6 Clicks" Daily Activity  Outcome Measure Difficulty turning over in bed (including adjusting bedclothes, sheets and blankets)?: Unable Difficulty moving from lying on back to sitting on the side of the bed? : Unable Difficulty sitting down on and standing up from a chair with arms (e.g., wheelchair, bedside commode, etc,.)?: Unable Help needed moving to and from a bed to chair (including a wheelchair)?: Total Help needed walking in hospital room?: Total Help needed climbing 3-5 steps with a railing? : Total 6 Click Score: 6    End of Session Equipment Utilized During Treatment: Oxygen Activity Tolerance: Patient limited by fatigue Patient left: in chair;with call bell/phone within reach;with chair alarm set   PT Visit Diagnosis: Muscle weakness (generalized) (M62.81);Other abnormalities of gait and mobility (R26.89)    Time: [id number] PT Time Calculation (min) (ACUTE ONLY): 27 min   Charges:   PT Evaluation $PT Eval Moderate Complexity: 1 Mod PT Treatments $Therapeutic Activity: 8-22 mins   PT G Codes:        04/05/2017  [place] BingKen Britania Shreeve, PT [phone]  (pager)  Eliseo GumKenneth V Kasy Iannacone 04/05/2017, 4:01 PM

## 2017-04-05 NOTE — Progress Notes (Signed)
Family Medicine Teaching Service Daily Progress Note Intern Pager: [phone]  Patient name: Amanda [name] Medical record number: [id number] Date of birth: 08/10/1922 Age: 84 y.o. Gender: female  Primary Care Provider: Barbie BannerWilson, Fred H, MD Consultants: cardiology Code Status: full  Pt Overview and Major Events to Date:  1/28 admitted to fpts, cardiology consulted, cardizem gtt stopped   Assessment and Plan: Amanda ArnHallie B Wike is a 84 y.o. female presenting with cough, SOB for the past few days. PMH is significant for A fib, dementia, hypothyroidism, HLD, HTN, GERD, osteoporosis.  Acute hypoxic respiratory failure  HFiEF Multi-week course of worsening SoB and cough. S/P Z-pack treatment without resolution. Concern for worsening heart failure given new CXR c/w cardiomegaly and bilateral pleural effusions. Resting comfortably, satting 100% on 2L. Turned down to 1L this morning, still satting 100% 5 minutes later. BNP 511. Improvement with Lasix x1. Weight apparently increased from 103 to 109 since admission. Initial troponin 5.87 and has since downtrended to 4.17. These findings consistent with volume overload which likely caused slight demand ischemia. Given rapid improvement with afib control and diuresis this is felt to be unlikely to be infectious. If patient spikes fever we can restart abx but will hold them for now. S/P 1 day Vanc/Cefepime. - Cardiology following, appreciate recs - continuous cardiac monitoring and pulse ox - daily CBC, CMP - daily weights - strict I&Os - Echocardiogram - IS - Lasix 20mg  IV daily  Paroxysmal Afib On Warfarin. INR on admission 1.95. 1/29 2.12. Rate controlled overnight on dilt gtt. Stopped dilt GTT as HR in 50s. Can likely increase dose of metoprolol to 50mg  bid for oral control when heart rate permits. - can likely increase metoprolol 50mg  bid when HR increases some - daily PT/INR  Dementia with behavioral disturbance [facility] the [facility]. Previously at [facility] for rehab after last hospitalization in 09/2016. On Seroquel, Ativan at home. - continue home seroquel - hold home ativan, can restart if becomes agitated - Palliative Care consult  Hypertension Previously on Metoprolol tartrate 25mg  BID but was stopped at her SNF recently, family unsure why. - continue home metoprolol, can possibly increase for HR control - continue seroquel  Osteoporosis Previous hospitalization 09/2016 for thoracic compression fracture. - monitor  Hypothyroidism On synthroid 88mcg at home.  - TSH - continue home synthroid  FEN/GI: regular diet Prophylaxis: Warfarin per pharmacy  Disposition: snf vs hospice?  Subjective:  Doing ok this morning. Only oriented to self. Had not eaten yet.  Objective: Temp:  [98.4 F (36.9 C)-99.8 F (37.7 C)] 98.4 F (36.9 C) (01/29 0445) Pulse Rate:  [39-130] 69 (01/29 0445) Resp:  [17-32] 20 (01/29 0445) BP: (98-145)/(64-98) 98/67 (01/29 0445) SpO2:  [94 %-100 %] 100 % (01/29 0445) Weight:  [103 lb (46.7 kg)] 103 lb (46.7 kg) (01/28 1008) Physical Exam: General: frail, caucasian female lying in bed. Only oriented to self, NAD CV: irregular rhythm, regular rate. Palpable radial pulse. Resp: On 1L [place], no increased work of breathing, comfortable, improving coarse breath sounds Abdomen: soft, non-tender, non-distended. No guarding or peritoneal signs. Neuro: alert. Oriented to person. Could not state time or place. Psych: mood appropriate, pleasantly demented  Laboratory: Recent Labs  Lab 04/04/17 1023 04/04/17 1047 04/05/17 0406  WBC 11.4*  --  8.8  HGB 10.1* 11.6* 9.5*  HCT 33.0* 34.0* 30.0*  PLT 312  --  294   Recent Labs  Lab 04/04/17 1023 04/04/17 1047 04/04/17 1324 04/05/17 0406  NA 134* 135 134* 134*  K 6.6* 6.5* 5.3* 4.7  CL 102 104 102 102  CO2 20*  --  22 19*  BUN 19 25* 18 19  CREATININE 1.13* 1.00 1.12* 1.01*  CALCIUM 8.4*  --   8.4* 8.2*  PROT 7.4  --  7.3 6.2*  BILITOT 1.7*  --  0.7 0.7  ALKPHOS 118  --  114 95  ALT 55*  --  51 56*  AST 126*  --  106* 100*  GLUCOSE 165* 173* 154* 112*    Imaging/Diagnostic Tests: CLINICAL DATA:  Shortness of breath and hypoxia. Recent diagnosis pneumonia.  EXAM: PORTABLE CHEST 1 VIEW  COMPARISON:  Radiographs 09/09/2016 and 04/23/2014. Abdominal CT 09/14/2016.  FINDINGS: The heart is enlarged. There is aortic atherosclerosis. There are diffuse bilateral pulmonary opacities most consistent with pulmonary edema. Moderate bilateral pleural effusions are present. There is no pneumothorax. Thoracolumbar scoliosis noted. Multiple telemetry leads overlie the chest.  IMPRESSION: Cardiomegaly with edema and bilateral pleural effusions most consistent with congestive heart failure.  Myrene Buddy, MD 04/05/2017, 6:25 AM PGY-1, [facility] Family Medicine FPTS Intern pager: [phone], text pages welcome

## 2017-04-05 NOTE — Progress Notes (Signed)
CSW acknowledges consult for SNF. [facility] and is a private pay long term care resident there. CSW following for palliative recommendations and will support with disposition planning. Full assessment to follow.  Abigail ButtsSusan Kenika Sahm, LCSWA [phone]

## 2017-04-05 NOTE — Progress Notes (Signed)
Initial Nutrition Assessment  DOCUMENTATION CODES:   Severe malnutrition in context of chronic illness  INTERVENTION:    Ensure Enlive po BID, each supplement provides 350 kcal and 20 grams of protein  Magic cup TID with meals, each supplement provides 290 kcal and 9 grams of protein  NUTRITION DIAGNOSIS:   Severe Malnutrition related to chronic illness(dementia, CHF) as evidenced by severe fat depletion, severe muscle depletion.  GOAL:   Patient will meet greater than or equal to 90% of their needs  MONITOR:   PO intake, Supplement acceptance  REASON FOR ASSESSMENT:   Malnutrition Screening Tool    ASSESSMENT:   84 yo female with PMH of A fib, HLD, GERD, osteoarthritis, hypothyroidism, osteoporosis, HTN, dementia who was admitted on 1/28 with SOB and cough, bilateral pleural effusions consistent with CHF.  Patient unable to provide any nutrition hx. Husband and son in her room during RD visit. Son provided history as he was feeding his mom. Recent intake has been poor. Progressive weight loss over the past year or so. No history of dysphagia. She drank Ensure supplements once daily PTA. She has lost 13% of her usual weight within the past year, 5% weight loss within the past 6 months.  Labs and medications reviewed. Palliative Care team has been consulted.   NUTRITION - FOCUSED PHYSICAL EXAM:    Most Recent Value  Orbital Region  Severe depletion  Upper Arm Region  Severe depletion  Thoracic and Lumbar Region  Moderate depletion  Buccal Region  Moderate depletion  Temple Region  Moderate depletion  Clavicle Bone Region  Severe depletion  Clavicle and Acromion Bone Region  Severe depletion  Scapular Bone Region  Moderate depletion  Dorsal Hand  Severe depletion  Patellar Region  Mild depletion  Anterior Thigh Region  Moderate depletion  Posterior Calf Region  Mild depletion  Edema (RD Assessment)  Mild  Hair  Reviewed  Eyes  Reviewed  Mouth  Reviewed  Skin   Reviewed  Nails  Reviewed       Diet Order:  Fall precautions Diet regular Room service appropriate? Yes; Fluid consistency: Thin  EDUCATION NEEDS:   No education needs have been identified at this time  Skin:  Skin Assessment: Skin Integrity Issues: Skin Integrity Issues:: Unstageable, Stage I Stage I: sacrum Unstageable: R hip  Last BM:  unknown  Height:   Ht Readings from Last 1 Encounters:  04/04/17 4\' 11"  (1.499 m)    Weight:   Wt Readings from Last 1 Encounters:  04/05/17 109 lb 2 oz (49.5 kg)    Ideal Body Weight:  44.5 kg  BMI:  Body mass index is 22.04 kg/m.  Estimated Nutritional Needs:   Kcal:  1200-1400  Protein:  60-70 gm  Fluid:  1.4 L   Joaquin CourtsKimberly Harris, RD, LDN, CNSC Pager [phone] After Hours Pager [phone]

## 2017-04-05 NOTE — Progress Notes (Signed)
  Echocardiogram 2D Echocardiogram has been performed.  Celene SkeenVijay  Oluwatimileyin Vivier 04/05/2017, 2:59 PM

## 2017-04-05 NOTE — Progress Notes (Signed)
Pt received scheduled PM dose of PO metoprolol as ordered, HR has progressively decreased and now sustaining low 60's and dropping intermittently to mid 50's.Cardizem gtt weaned to off.   SBP maintaining low 100's. Pt is resting comfortably at this time. MD on call notified through [facility]. Will continue to monitor. Dierdre HighmanHall, Maximiliano Cromartie Marie, RN

## 2017-04-06 ENCOUNTER — Other Ambulatory Visit: Payer: Self-pay

## 2017-04-06 DIAGNOSIS — I5021 Acute systolic (congestive) heart failure: Secondary | ICD-10-CM

## 2017-04-06 LAB — CBC WITH DIFFERENTIAL/PLATELET
Basophils Absolute: 0 10*3/uL (ref 0.0–0.1)
Basophils Relative: 0 %
Eosinophils Absolute: 0.1 10*3/uL (ref 0.0–0.7)
Eosinophils Relative: 1 %
HEMATOCRIT: 31.8 % — AB (ref 36.0–46.0)
HEMOGLOBIN: 9.9 g/dL — AB (ref 12.0–15.0)
LYMPHS ABS: 2.7 10*3/uL (ref 0.7–4.0)
LYMPHS PCT: 29 %
MCH: 27.3 pg (ref 26.0–34.0)
MCHC: 31.1 g/dL (ref 30.0–36.0)
MCV: 87.8 fL (ref 78.0–100.0)
MONOS PCT: 9 %
Monocytes Absolute: 0.9 10*3/uL (ref 0.1–1.0)
NEUTROS PCT: 61 %
Neutro Abs: 5.8 10*3/uL (ref 1.7–7.7)
Platelets: 286 10*3/uL (ref 150–400)
RBC: 3.62 MIL/uL — AB (ref 3.87–5.11)
RDW: 16.7 % — ABNORMAL HIGH (ref 11.5–15.5)
WBC: 9.5 10*3/uL (ref 4.0–10.5)

## 2017-04-06 LAB — COMPREHENSIVE METABOLIC PANEL
ALT: 47 U/L (ref 14–54)
AST: 61 U/L — ABNORMAL HIGH (ref 15–41)
Albumin: 2.1 g/dL — ABNORMAL LOW (ref 3.5–5.0)
Alkaline Phosphatase: 97 U/L (ref 38–126)
Anion gap: 10 (ref 5–15)
BUN: 18 mg/dL (ref 6–20)
CHLORIDE: 101 mmol/L (ref 101–111)
CO2: 24 mmol/L (ref 22–32)
Calcium: 8.3 mg/dL — ABNORMAL LOW (ref 8.9–10.3)
Creatinine, Ser: 1.14 mg/dL — ABNORMAL HIGH (ref 0.44–1.00)
GFR, EST AFRICAN AMERICAN: 47 mL/min — AB (ref >60)
GFR, EST NON AFRICAN AMERICAN: 40 mL/min — AB (ref >60)
Glucose, Bld: 99 mg/dL (ref 65–99)
POTASSIUM: 4.1 mmol/L (ref 3.5–5.1)
SODIUM: 135 mmol/L (ref 135–145)
Total Bilirubin: 0.5 mg/dL (ref 0.3–1.2)
Total Protein: 6.2 g/dL — ABNORMAL LOW (ref 6.5–8.1)

## 2017-04-06 LAB — PROTIME-INR
INR: 2.17
PROTHROMBIN TIME: 24 s — AB (ref 11.4–15.2)

## 2017-04-06 MED ORDER — FUROSEMIDE 10 MG/ML IJ SOLN: 20.0000 mg | Freq: Once | INTRAMUSCULAR | Status: DC

## 2017-04-06 MED ORDER — WARFARIN SODIUM 4 MG PO TABS: 4.0000 mg | ORAL_TABLET | ORAL | Status: DC

## 2017-04-06 MED ORDER — FUROSEMIDE 10 MG/ML IJ SOLN
20.0000 mg | Freq: Once | INTRAMUSCULAR | Status: AC
  Administered 2017-04-06: 20 mg via INTRAVENOUS
  Filled 2017-04-06: qty 2

## 2017-04-06 MED ORDER — WARFARIN SODIUM 2 MG PO TABS
2.0000 mg | ORAL_TABLET | ORAL | Status: DC
  Administered 2017-04-06: 2 mg via ORAL
  Filled 2017-04-06: qty 1

## 2017-04-06 NOTE — Plan of Care (Signed)
Pt continues with congested cough-able to maintain airway but difficulty expectorating sputum.

## 2017-04-06 NOTE — Progress Notes (Signed)
Progress Note  Patient Name: Amanda [name] Date of Encounter: 04/06/2017  Primary Cardiologist: Delton SeeNelson  Subjective   Still short of breath this morning with very congested cough.   Inpatient Medications    Scheduled Meds: . Chlorhexidine Gluconate Cloth  6 each Topical Q0600  . feeding supplement (ENSURE ENLIVE)  237 mL Oral BID BM  . levothyroxine  88 mcg Oral QAC breakfast  . metoprolol tartrate  25 mg Oral BID  . mupirocin ointment  1 application Nasal BID  . off the beat book   Does not apply Once  . QUEtiapine  25 mg Oral BID  . sodium chloride flush  3 mL Intravenous Q12H  . Warfarin - Pharmacist Dosing Inpatient   Does not apply q1800   Continuous Infusions: . sodium chloride    . diltiazem (CARDIZEM) infusion Stopped (04/05/17 0255)   PRN Meds: sodium chloride, guaiFENesin, ondansetron **OR** ondansetron (ZOFRAN) IV, polyethylene glycol, sodium chloride flush   Vital Signs    Vitals:   04/05/17 2100 04/06/17 0017 04/06/17 0430 04/06/17 0805  BP: 130/88 108/70 132/75 112/81  Pulse: 94 82 91   Resp: (!) 23 20 18    Temp: 97.9 F (36.6 C) 98.1 F (36.7 C) 97.8 F (36.6 C) 99.5 F (37.5 C)  TempSrc: Axillary Axillary Axillary Oral  SpO2: 100% 100% 99% 95%  Weight:   106 lb 4.2 oz (48.2 kg)   Height:       No intake or output data in the 24 hours ending 04/06/17 1031 Filed Weights   04/04/17 1008 04/05/17 0445 04/06/17 0430  Weight: 103 lb (46.7 kg) 109 lb 2 oz (49.5 kg) 106 lb 4.2 oz (48.2 kg)    Telemetry    Afib rate 70-90s - Personally Reviewed  Physical Exam   General: Thin, very frail older W female appearing in no acute distress. Head: Normocephalic, atraumatic.  Neck: Supple, + JVD. Lungs:  Resp regular, but labored, Course rhonchi and rales. Heart: Irreg Irreg, S1, S2, or murmur; no rub. Abdomen: Soft, non-tender, non-distended with normoactive bowel sounds.  Extremities: No clubbing, cyanosis, trace LE edema. Distal pedal pulses are  2+ bilaterally. Neuro: Alert and oriented to self. Moves all extremities spontaneously. Psych: Normal affect.  Labs    Chemistry Recent Labs  Lab 04/04/17 1324 04/05/17 0406 04/06/17 0340  NA 134* 134* 135  K 5.3* 4.7 4.1  CL 102 102 101  CO2 22 19* 24  GLUCOSE 154* 112* 99  BUN 18 19 18   CREATININE 1.12* 1.01* 1.14*  CALCIUM 8.4* 8.2* 8.3*  PROT 7.3 6.2* 6.2*  ALBUMIN 2.5* 2.2* 2.1*  AST 106* 100* 61*  ALT 51 56* 47  ALKPHOS 114 95 97  BILITOT 0.7 0.7 0.5  GFRNONAA 41* 47* 40*  GFRAA 48* 54* 47*  ANIONGAP 10 13 10      Hematology Recent Labs  Lab 04/04/17 1023 04/04/17 1047 04/05/17 0406 04/06/17 0340  WBC 11.4*  --  8.8 9.5  RBC 3.75*  --  3.40* 3.62*  HGB 10.1* 11.6* 9.5* 9.9*  HCT 33.0* 34.0* 30.0* 31.8*  MCV 88.0  --  88.2 87.8  MCH 26.9  --  27.9 27.3  MCHC 30.6  --  31.7 31.1  RDW 16.7*  --  17.2* 16.7*  PLT 312  --  294 286    Cardiac Enzymes Recent Labs  Lab 04/04/17 1324 04/04/17 1621 04/04/17 2154 04/05/17 0406  TROPONINI 5.49* 4.43* 5.68* 4.17*    Recent Labs  Lab  04/04/17 1041  TROPIPOC 5.87*     BNP Recent Labs  Lab 04/04/17 1023  BNP 511.3*     DDimer No results for input(s): DDIMER in the last 168 hours.    Radiology    No results found.  Cardiac Studies   Cath: 04/05/17  Study Conclusions  - Left ventricle: The cavity size was normal. Septal wall thickness   was increased in a pattern of mild LVH. Systolic function was   severely reduced. The estimated ejection fraction was in the   range of 20% to 25%. Diffuse hypokinesis. - Aortic valve: Transvalvular velocity was within the normal range.   There was no stenosis. There was mild regurgitation. - Mitral valve: Transvalvular velocity was within the normal range.   There was no evidence for stenosis. There was mild regurgitation. - Left atrium: The atrium was severely dilated. - Right ventricle: The cavity size was normal. Wall thickness was   normal. Systolic  function was moderately reduced. - Right atrium: The atrium was severely dilated. - Tricuspid valve: There was severe regurgitation. - Pulmonary arteries: Systolic pressure was mildly increased. PA   peak pressure: 47 mm Hg (S).  Patient Profile     84 y.o. female with permanent atrial fibrillation on warfarin presenting with acute respiratory failure and signs of acute on chronic diastolic? heart failure.   Assessment & Plan    1. Acute systolic HF: CXR with bilateral effusions. Was given IV lasix yesterday. Little UOP documented, but weight is trending down. She remains volume overloaded on exam. Echo showed significant decline in EF to 25% with diffuse hypokinesis. Given her age and co morbidities she is not a candidate for invasive testing.  -- Will redose lasix again today.  2. Afib RVR: rate is now controlled on BB. On coumadin for OAC.   3. Elevated troponin: Peaked at 5.68, echo shows decline in EF to 25%. Its certainly possible she has underlying coronary disease but at her age with dementia and frail state she is not a candidate for invasive therapies. Would continue to treat medically at this time.   Of note palliative care has been consulted. Patient remains a Full code at this time, but discussion being had about goals of care with family.   Signed, Laverda Page, NP  04/06/2017, 10:31 AM  Pager # [phone]   For questions or updates, please contact [facility] Please consult [email or web address] for contact info under Cardiology/STEMI.  I have personally seen and examined this patient with Laverda Page, NP. I agree with the assessment and plan as outlined above.  She remains volume overloaded on exam. Echo with LvEF=25%. She is not a candidate for invasive cardiac testing given age and dementia. She is having no chest pain or dyspnea.  HR is controlled on oral metoprolol.  I agree with additional Lasix today.  Given her worsened LV systolic dysfunction, advanced age and  advanced dementia, discussion regarding palliative care and DNR is appropriate.   Amanda [name] 04/06/2017 11:38 AM

## 2017-04-06 NOTE — Progress Notes (Signed)
Family Medicine Teaching Service Daily Progress Note Intern Pager: [phone]  Patient name: Amanda [name] Medical record number: [id number] Date of birth: 07/09/1922 Age: 84 y.o. Gender: female  Primary Care Provider: Barbie BannerWilson, Fred H, MD Consultants: Cardiology,  Code Status: Full  Pt Overview and Major Events to Date:  1/28: admitted to fpts, cardiology consulted, cardizem gtt stopped 1/29: continued diuresis with lasix, consulted palliative care for continued GOC conversation  Assessment and Plan: Amanda [name] a 84 y.o.femalewith PMH significant forA fib, dementia, hypothyroidism, HLD, HTN, GERD, osteoporosis who presented to the ED from her SNF 1/28 with worsening cough and SOB over several days.   Acute hypoxic respiratory failure HFrEF: Pt presents with several days of worsening SOB and cough. S/P Z-pack treatment without resolution. Fluid overloaded on exam (+LE edema, JVD) and CXR on admission w cardiomegaly and b/l pleural effusions. Trop on admission 5.87, downtrended x 3 to 4.17. Echo 1/29 with EF 20-25% (last echo 2015, EF 60-65%), mildly elevated PA pressure (47mmHg), severely dilated atria b/l, and mild AR/MR. Received lasix 20mg  IV x 3 since admission with significant improvement in respiratory exam. Today, patient is resting  comfortably, satting 100% on 2L. Given improvement with diuresis and echo result, likely etiology of respiratory failure is worsening HF with elevated troponins 2/2 volume overload resulting in demand ischemia.  - S/P 1 day Vanc/Cefepime (1/28); unlikely infectious but can consider re-initiating if patient does not improve with further diuresis or fevers - Cardiology following, appreciate recs - continuous cardiac monitoring and pulse ox - daily CBC, CMP - daily weights, strict I&Os - IS - Lasix 20mg  IV redose today  Paroxysmal Afib: OAC with warfarin. INR on admission 1.95, at goal now with 2.17 1/30. Presented with RVR initally treated with  dilt GTT but d/c'ed due to subsquent bradycardia. Pt was not on any rate or rhythm control outpatient (provider at SNF stopped metoprolol for unclear reasons).  - continue metoprolol 25mg  BID; can consider increase if HR>110 - daily PT/INR  Dementia with behavioral disturbance: [facility] to admission. [facility] after last hospitalization in 09/2016. On Seroquel, Ativan at home. - continue home seroquel - hold home ativan, can restart if becomes agitated - Palliative Care consult  Hypertension: BP at goal during admission.  - continue metoprolol 25mg  BID  Osteoporosis: Previous hospitalization 09/2016 for thoracic compression fracture. - CTM  Hypothyroidism: On synthroid 88mcg at home, TSH wnl on admission.  - continue home synthroid  FEN/GI:regular diet Prophylaxis:home anticoagulation with warfarin  Disposition: snf vs hospice  Subjective:  Pt reports that she feels like her "lips are sandpaper" and she would like to feel better soon so she can go home. Although she reports that she feels like her cough has improved, she reports significant fatigue/tiredness, like she is in a "fog".   Objective: Temp:  [97.5 F (36.4 C)-99.5 F (37.5 C)] 99.5 F (37.5 C) (01/30 0805) Pulse Rate:  [82-94] 91 (01/30 0430) Resp:  [18-23] 18 (01/30 0430) BP: (108-132)/(64-88) 112/81 (01/30 0805) SpO2:  [95 %-100 %] 95 % (01/30 0805) Weight:  [106 lb 4.2 oz (48.2 kg)] 106 lb 4.2 oz (48.2 kg) (01/30 0430) Physical Exam: General: frail, elderly appearing, in NAD, cachetic Cardiovascular: irregularly irregular, no m/r/g. +JVD? (patient continually moving/talking through exam) Respiratory: poor inspiratory effort, satting 99% with [place] in place at 2L. Coarse breath sounds b/l with b/l LL crackles Abdomen: soft, nontender, non-distended, normoactive bowel sounds Extremities: 1+  pitting edema to lower-shin bilaterally, 2+DP and radial  pulses.   Laboratory: Recent Labs  Lab 04/04/17 1023 04/04/17 1047 04/05/17 0406 04/06/17 0340  WBC 11.4*  --  8.8 9.5  HGB 10.1* 11.6* 9.5* 9.9*  HCT 33.0* 34.0* 30.0* 31.8*  PLT 312  --  294 286   Recent Labs  Lab 04/04/17 1324 04/05/17 0406 04/06/17 0340  NA 134* 134* 135  K 5.3* 4.7 4.1  CL 102 102 101  CO2 22 19* 24  BUN 18 19 18   CREATININE 1.12* 1.01* 1.14*  CALCIUM 8.4* 8.2* 8.3*  PROT 7.3 6.2* 6.2*  BILITOT 0.7 0.7 0.5  ALKPHOS 114 95 97  ALT 51 56* 47  AST 106* 100* 61*  GLUCOSE 154* 112* 99    Imaging/Diagnostic Tests: Echo: - Left ventricle: The cavity size was normal. Septal wall thickness   was increased in a pattern of mild LVH. Systolic function was   severely reduced. The estimated ejection fraction was in the   range of 20% to 25%. Diffuse hypokinesis. - Aortic valve: Transvalvular velocity was within the normal range.   There was no stenosis. There was mild regurgitation. - Mitral valve: Transvalvular velocity was within the normal range.   There was no evidence for stenosis. There was mild regurgitation. - Left atrium: The atrium was severely dilated. - Right ventricle: The cavity size was normal. Wall thickness was   normal. Systolic function was moderately reduced. - Right atrium: The atrium was severely dilated. - Tricuspid valve: There was severe regurgitation. - Pulmonary arteries: Systolic pressure was mildly increased. PA   peak pressure: 47 mm Hg (S).  Desiree Hane, Medical Student 04/06/2017, 9:16 AM   I have personally seen and examined this patient with Desiree Hane and agree with the above note. The following is my additional documentation.   Physical Exam: General: frail elderly woman, NAD with non-toxic appearance HEENT: normocephalic, atraumatic, moist mucous membranes Neck: supple, non-tender without lymphadenopathy, no JVD Cardiovascular: regular rate and rhythm without murmurs, rubs, or gallops Lungs:  bibasilar crackles bilaterally with normal work of breathing on room air Abdomen: soft, non-tender, non-distended, normoactive bowel sounds Skin: warm, dry, no rashes or lesions, cap refill < 2 seconds Extremities: warm and well perfused, normal tone, 1+ edema bilaterally  Assessment/Plan Amanda [name] is a 84 y.o. womsn presenting with SOB d/y new onset HFrEF. PMH significant forA fib, dementia, hypothyroidism, HLD, HTN, GERD, osteoporosis.  HFrEF  SOB: New diagnosis by echocardiogram on admission.  EF 20-25% with diffuse hypokinesis new from prior echo.  Patient initially with presentation of fluid overload improved with resolution of SOB following IV Lasix 20 mg x2.  We will give additional Lasix 20 mg IV today.  Cardiology following. Paroxysmal atrial fibrillation: Chronic.  Now rate controlled.  Presented in A. fib with RVR due to discontinuation of beta-blocker 2 weeks ago.  Initially required Cardizem drip and transition to oral metoprolol 25 mg twice daily.  On telemetry. Dementia with behavioral disturbance: Resident at [facility].  On Seroquel and Ativan at home.  Continuing home medications with exception of Ativan.  Appears to be alert and oriented following control of A. fib and diuresis. Hypertension: Chronic.  Normotensive since restarting metoprolol 25 mg twice daily.  Dispel: Pending palliative consult with family today.  Likely return to [facility] 04/06/2017 with palliative recommendations.  Durward Parcel, DO [facility] Family Medicine, PGY-2

## 2017-04-06 NOTE — Plan of Care (Signed)
Pt's HR started to sustain in the 120's around 1900-2000. HR did reach 140's-150's for a brief period. Administered pt's scheduled PO metoprolol and HR came back to baseline. Continuing to monitor closely.

## 2017-04-06 NOTE — Progress Notes (Signed)
ANTICOAGULATION CONSULT NOTE - Follow Up Consult  Pharmacy Consult for Coumadin Indication: atrial fibrillation  Allergies  Allergen Reactions  . Alendronate Other (See Comments)    GI Upset (intolerance) At risk for GI perforation  . Aspirin Diarrhea and Other (See Comments)    Melena, black tarry stools  . Aspirin-Acetaminophen-Caffeine Other (See Comments)    Melena, Dark stool  . Nsaids Other (See Comments)    Patient has ulcer   . Sulfamethoxazole Hives  . Sulfonamide Derivatives Hives  . Cephalexin Rash  . Codeine Nausea And Vomiting    GI upset    Patient Measurements: Height: 4\' 11"  (149.9 cm) Weight: 106 lb 4.2 oz (48.2 kg) IBW/kg (Calculated) : 43.2  Vital Signs: Temp: 99.5 F (37.5 C) (01/30 0805) Temp Source: Oral (01/30 0805) BP: 112/81 (01/30 0805) Pulse Rate: 91 (01/30 0430)  Labs: Recent Labs    04/04/17 1023 04/04/17 1047  04/04/17 1324 04/04/17 1621 04/04/17 2154 04/05/17 0406 04/06/17 0340  HGB 10.1* 11.6*  --   --   --   --  9.5* 9.9*  HCT 33.0* 34.0*  --   --   --   --  30.0* 31.8*  PLT 312  --   --   --   --   --  294 286  LABPROT 22.1*  --   --   --   --   --  23.6* 24.0*  INR 1.95  --   --   --   --   --  2.12 2.17  CREATININE 1.13* 1.00  --  1.12*  --   --  1.01* 1.14*  TROPONINI  --   --    < > 5.49* 4.43* 5.68* 4.17*  --    < > = values in this interval not displayed.    Estimated Creatinine Clearance: 21.5 mL/min (A) (by C-G formula based on SCr of 1.14 mg/dL (H)).  Assessment:  84 yr old female continues on Coumadin as prior to admission for atrial fibrillation.   INR remains therapeutic, 2.17.    Most recent Coumadin regimen: 2 mg MWF, 4 mg TTSS.  Since 03/28/17.   [facility] in shadow chart shows multiple regimen changes over the last month, including Vitamin K 5 mg PO given on 1/4 and 03/13/17.  Goal of Therapy:  INR 2-3 Monitor platelets by anticoagulation protocol: Yes   Plan:   Continue Coumadin 2 mg  MWF and 4 mg TTSS.  Daily PT/INR for now.  Discussed with son.  Dennie FettersEgan, Coda Mathey Donovan, RPh Pager: [phone] 04/06/2017,12:30 PM

## 2017-04-06 NOTE — Progress Notes (Deleted)
Patient ID: Amanda [name], female   DOB: 11/25/1922, 84 y.o.   MRN: [id number]  This NP visited patient at the bedside as a follow up to  yesterday's GOCs meeting.   DNR/DNI     Discussed with patient the importance of continued conversation with family and their  medical providers regarding overall plan of care and treatment options,  ensuring decisions are within the context of the patients values and GOCs.  Questions and concerns addressed   Discussed with Dr  Time in           Time out    Total time spent on the unit was   Greater than 50% of the time was spent in counseling and coordination of care  Lorinda CreedMary Bert Ptacek NP  Palliative Medicine Team Team Phone # [phone] Pager [phone]

## 2017-04-06 NOTE — Discharge Summary (Signed)
Amanda [name] Discharge Summary  Patient name: Amanda [name] Medical record number: [id number] Date of birth: 28-Jul-1922 Age: 84 y.o. Gender: female Date of Admission: 04/04/2017  Date of Discharge: 04/06/2016 Admitting Physician: Rory Percy, DO  Primary Care Provider: Christain Sacramento, MD Consultants: Cardiology, Palliative Care  Indication for Hospitalization: Acute Hypoxic Respiratory Failure  Discharge Diagnoses/Problem List:  Heart failure with reduced ejection fraction (EF 20-25%) Dementia Osteoporosis Hypothyroidism HLD GERD  Disposition: SNF  Discharge Condition: Fair  Discharge Exam:  General: frail elderly woman, NAD with non-toxic appearance HEENT: normocephalic, atraumatic, moist mucous membranes Neck: supple, non-tender without lymphadenopathy Cardiovascular: irregularly irregular at 101 and rhythm without murmurs, rubs, or gallops Lungs: clear to auscultation bilaterally with normal work of breathing on room air Abdomen: soft, non-tender, non-distended, normoactive bowel sounds Skin: warm, dry, no rashes or lesions, cap refill < 2 seconds Extremities: warm and well perfused, normal tone, no edema  Brief [name] Course:  See H&P for full details of initial presentation and work-up. Briefly, Amanda [name] a 84 y.o.F with PMH significant forA fib (on chronic [place] with warfarin), dementia, hypothyroidism, HLD, HTN, GERD, osteoporosis who presented to the ED from her SNF 1/28 with worsening cough and SOB over several days that did not improve with azithromycin. On presentation, she was clinically fluid overloaded with pulmonary edema on CXR and significant LE edema on exam, requiring 1-2L [place] to maintain appropriate O2 sats. She was also in atrial fibrillation with RVR. A diltiazem drip was started, diuresis was initiated, and antibiotics were discontinued (she received one dose of vanc/cefepime in the ED). She clinically improved and was  converted to oral metoprolol on HD #1 which was titrated to achieve good rate control. Echo 1/29 showed EF 20-25% (last echo 2015, EF 60-65%), mildly elevated PA pressure (84mHg), severely dilated atria b/l, and severe TR. With diuresis, she improved significantly and was no longer requiring O2 at the time of discharge. During the hospitalization, given the patient's multiple co-morbities and functional decline, palliative care was consulted. They met with the family, who initially desired for the patient to be full code, and discussed goals of care. They reviewed the patient's living will, and after prolonged discussion, changed the patient's status to DNR.   Issues for Follow Up:  1. Cardiology recommended increasing Lopressor to 37.5 mg twice daily.  Can titrate as needed.  Patient should remain on this medication to control her atrial fibrillation. 2. Patient appears to have worsened heart failure based on echocardiogram with an EF of 20-25%.  She will need to be on Lasix 20 mg daily.  If she begins to have increased fluid retention, she may need additional diuretics.  Significant Procedures: none  Significant Labs and Imaging:  Recent Labs  Lab 04/04/17 1023 04/04/17 1047 04/05/17 0406 04/06/17 0340  WBC 11.4*  --  8.8 9.5  HGB 10.1* 11.6* 9.5* 9.9*  HCT 33.0* 34.0* 30.0* 31.8*  PLT 312  --  294 286   Recent Labs  Lab 04/04/17 1023 04/04/17 1047 04/04/17 1324 04/04/17 1621 04/05/17 0406 04/06/17 0340  NA 134* 135 134*  --  134* 135  K 6.6* 6.5* 5.3*  --  4.7 4.1  CL 102 104 102  --  102 101  CO2 20*  --  22  --  19* 24  GLUCOSE 165* 173* 154*  --  112* 99  BUN 19 25* 18  --  19 18  CREATININE 1.13* 1.00 1.12*  --  1.01*  1.14*  CALCIUM 8.4*  --  8.4*  --  8.2* 8.3*  MG  --   --   --  1.7  --   --   ALKPHOS 118  --  114  --  95 97  AST 126*  --  106*  --  100* 61*  ALT 55*  --  51  --  56* 47  ALBUMIN 2.6*  --  2.5*  --  2.2* 2.1*   Echo 1/29: - Left ventricle: The  cavity size was normal. Septal wall thickness was increased in a pattern of mild LVH. Systolic function was severely reduced. The estimated ejection fraction was in the range of 20% to 25%. Diffuse hypokinesis. - Aortic valve: Transvalvular velocity was within the normal range. There was no stenosis. There was mild regurgitation. - Mitral valve: Transvalvular velocity was within the normal range. There was no evidence for stenosis. There was mild regurgitation. - Left atrium: The atrium was severely dilated. - Right ventricle: The cavity size was normal. Wall thickness was normal. Systolic function was moderately reduced. - Right atrium: The atrium was severely dilated. - Tricuspid valve: There was severe regurgitation. - Pulmonary arteries: Systolic pressure was mildly increased. PA peak pressure: 47 mm Hg (S).    Results/Tests Pending at Time of Discharge: None  Discharge Medications:  Allergies as of 04/07/2017      Reactions   Alendronate Other (See Comments)   GI Upset (intolerance) At risk for GI perforation   Aspirin Diarrhea, Other (See Comments)   Melena, black tarry stools   Aspirin-acetaminophen-caffeine Other (See Comments)   Melena, Dark stool   Nsaids Other (See Comments)   Patient has ulcer    Sulfamethoxazole Hives   Sulfonamide Derivatives Hives   Cephalexin Rash   Codeine Nausea And Vomiting   GI upset      Medication List    STOP taking these medications   amoxicillin-clavulanate 875-125 MG tablet Commonly known as:  AUGMENTIN     TAKE these medications   acetaminophen 325 MG tablet Commonly known as:  TYLENOL Take 650 mg by mouth every 4 (four) hours as needed for mild pain or moderate pain.   bisacodyl 5 MG EC tablet Commonly known as:  DULCOLAX Take 1 tablet (5 mg total) by mouth daily as needed for moderate constipation.   docusate sodium 100 MG capsule Commonly known as:  COLACE Take 100 mg by mouth daily.   docusate  sodium 250 MG capsule Commonly known as:  COLACE Take 1 capsule (250 mg total) by mouth daily.   feeding supplement (PRO-STAT SUGAR FREE 64) Liqd Take 30 mLs by mouth 2 (two) times daily.   fentaNYL 25 MCG/HR patch Commonly known as:  DURAGESIC - dosed mcg/hr Place 1 patch (25 mcg total) onto the skin every 3 (three) days.   furosemide 20 MG tablet Commonly known as:  LASIX Take 1 tablet (20 mg total) by mouth daily.   guaifenesin 100 MG/5ML syrup Commonly known as:  ROBITUSSIN Take 100 mg by mouth every 4 (four) hours as needed for cough.   ipratropium-albuterol 0.5-2.5 (3) MG/3ML Soln Commonly known as:  DUONEB Take 3 mLs by nebulization 3 (three) times daily.   levothyroxine 88 MCG tablet Commonly known as:  SYNTHROID, LEVOTHROID Take 88 mcg by mouth daily before breakfast.   LORazepam 0.5 MG tablet Commonly known as:  ATIVAN Take 0.5 mg by mouth at bedtime.   magnesium hydroxide 400 MG/5ML suspension Commonly known as:  MILK  OF MAGNESIA Take 30 mLs by mouth daily as needed for mild constipation.   Metoprolol Tartrate 37.5 MG Tabs Take 37.5 mg by mouth 2 (two) times daily. What changed:    medication strength  See the new instructions.   multivitamin tablet Take 1 tablet by mouth daily.   ondansetron 4 MG disintegrating tablet Commonly known as:  ZOFRAN ODT Take 1 tablet (4 mg total) by mouth every 8 (eight) hours as needed for nausea or vomiting.   ondansetron 4 MG/2ML Soln injection Commonly known as:  ZOFRAN Inject 4 mg into the muscle every 6 (six) hours as needed for nausea or vomiting.   Potassium 99 MG Tabs Take 297 mg by mouth daily.   QUEtiapine 25 MG tablet Commonly known as:  SEROQUEL Take 25 mg by mouth 2 (two) times daily.   SENNA-PLUS 8.6-50 MG tablet Generic drug:  senna-docusate Take 1 tablet by mouth daily as needed for mild constipation.   traMADol 50 MG tablet Commonly known as:  ULTRAM Take 50 mg by mouth every 6 (six) hours as  needed for moderate pain.   traMADol-acetaminophen 37.5-325 MG tablet Commonly known as:  ULTRACET Take 1 tablet by mouth every 8 (eight) hours as needed.   warfarin 1 MG tablet Commonly known as:  COUMADIN Take 1 tablet (1 mg total) by mouth daily at 6 PM. What changed:    how much to take  when to take this  additional instructions       Discharge Instructions: Please refer to Patient Instructions section of EMR for full details.  Patient was counseled important signs and symptoms that should prompt return to medical care, changes in medications, dietary instructions, activity restrictions, and follow up appointments.   Follow-Up Appointments: Follow-up Information    Christain Sacramento, MD. Call in 1 week(s).   Specialty:  Family Medicine Contact information: [address] [place] [address] [phone]           Asbury Bing, DO 04/07/2017, 2:43 PM

## 2017-04-06 NOTE — Discharge Instructions (Signed)
During this hospitalization, you were treated for heart failure which caused fluid to build in your lungs, resulting in difficulty breathing.   Please continue to take medicines as prescribed at your skilled nursing facility. We discussed the care that was best aligned with you and your family's wishes during this hospitalization. Please continue to consider this so we can best ensure your comfort and happiness with care.   ===================================================================================  Information on my medicine - Coumadin   (Warfarin)  Why was Coumadin prescribed for you? Coumadin was prescribed for you because you have a blood clot or a medical condition that can cause an increased risk of forming blood clots. Blood clots can cause serious health problems by blocking the flow of blood to the heart, lung, or brain. Coumadin can prevent harmful blood clots from forming. As a reminder your indication for Coumadin is:   Stroke Prevention Because Of Atrial Fibrillation  What test will check on my response to Coumadin? While on Coumadin (warfarin) you will need to have an INR test regularly to ensure that your dose is keeping you in the desired range. The INR (international normalized ratio) number is calculated from the result of the laboratory test called prothrombin time (PT).  If an INR APPOINTMENT HAS NOT ALREADY BEEN MADE FOR YOU please schedule an appointment to have this lab work done by your health care provider within 7 days. Your INR goal is usually a number between:  2 to 3 or your provider may give you a more narrow range like 2-2.5.  Ask your health care provider during an office visit what your goal INR is.  What  do you need to  know  About  COUMADIN? Take Coumadin (warfarin) exactly as prescribed by your healthcare provider about the same time each day.  DO NOT stop taking without talking to the doctor who prescribed the medication.  Stopping without other blood  clot prevention medication to take the place of Coumadin may increase your risk of developing a new clot or stroke.  Get refills before you run out.  What do you do if you miss a dose? If you miss a dose, take it as soon as you remember on the same day then continue your regularly scheduled regimen the next day.  Do not take two doses of Coumadin at the same time.  Important Safety Information A possible side effect of Coumadin (Warfarin) is an increased risk of bleeding. You should call your healthcare provider right away if you experience any of the following: ? Bleeding from an injury or your nose that does not stop. ? Unusual colored urine (red or dark brown) or unusual colored stools (red or black). ? Unusual bruising for unknown reasons. ? A serious fall or if you hit your head (even if there is no bleeding).  Some foods or medicines interact with Coumadin (warfarin) and might alter your response to warfarin. To help avoid this: ? Eat a balanced diet, maintaining a consistent amount of Vitamin K. ? Notify your provider about major diet changes you plan to make. ? Avoid alcohol or limit your intake to 1 drink for women and 2 drinks for men per day. (1 drink is 5 oz. wine, 12 oz. beer, or 1.5 oz. liquor.)  Make sure that ANY health care provider who prescribes medication for you knows that you are taking Coumadin (warfarin).  Also make sure the healthcare provider who is monitoring your Coumadin knows when you have started a new medication including  herbals and non-prescription products.  Coumadin (Warfarin)  Major Drug Interactions  Increased Warfarin Effect Decreased Warfarin Effect  Alcohol (large quantities) Antibiotics (esp. Septra/Bactrim, Flagyl, Cipro) Amiodarone (Cordarone) Aspirin (ASA) Cimetidine (Tagamet) Megestrol (Megace) NSAIDs (ibuprofen, naproxen, etc.) Piroxicam (Feldene) Propafenone (Rythmol SR) Propranolol (Inderal) Isoniazid (INH) Posaconazole (Noxafil)  Barbiturates (Phenobarbital) Carbamazepine (Tegretol) Chlordiazepoxide (Librium) Cholestyramine (Questran) Griseofulvin Oral Contraceptives Rifampin Sucralfate (Carafate) Vitamin K   Coumadin (Warfarin) Major Herbal Interactions  Increased Warfarin Effect Decreased Warfarin Effect  Garlic Ginseng Ginkgo biloba Coenzyme Q10 Green tea St. Johns wort    Coumadin (Warfarin) FOOD Interactions  Eat a consistent number of servings per week of foods HIGH in Vitamin K (1 serving =  cup)  Collards (cooked, or boiled & drained) Kale (cooked, or boiled & drained) Mustard greens (cooked, or boiled & drained) Parsley *serving size only =  cup Spinach (cooked, or boiled & drained) Swiss chard (cooked, or boiled & drained) Turnip greens (cooked, or boiled & drained)  Eat a consistent number of servings per week of foods MEDIUM-HIGH in Vitamin K (1 serving = 1 cup)  Asparagus (cooked, or boiled & drained) Broccoli (cooked, boiled & drained, or raw & chopped) Brussel sprouts (cooked, or boiled & drained) *serving size only =  cup Lettuce, raw (green leaf, endive, romaine) Spinach, raw Turnip greens, raw & chopped   These websites have more information on Coumadin (warfarin):  http://www.king-russell.com/; https://www.hines.net/;

## 2017-04-07 ENCOUNTER — Other Ambulatory Visit: Payer: Self-pay

## 2017-04-07 DIAGNOSIS — I5023 Acute on chronic systolic (congestive) heart failure: Secondary | ICD-10-CM

## 2017-04-07 LAB — PROTIME-INR
INR: 1.92
Prothrombin Time: 21.8 seconds — ABNORMAL HIGH (ref 11.4–15.2)

## 2017-04-07 MED ORDER — METOPROLOL TARTRATE 37.5 MG PO TABS: 37.5000 mg | ORAL_TABLET | Freq: Two times a day (BID) | ORAL | 0 refills | Status: AC

## 2017-04-07 MED ORDER — FUROSEMIDE 20 MG PO TABS: 20.0000 mg | ORAL_TABLET | Freq: Every day | ORAL | 0 refills | Status: AC

## 2017-04-07 MED ORDER — METOPROLOL TARTRATE 25 MG PO TABS
37.5000 mg | ORAL_TABLET | Freq: Two times a day (BID) | ORAL | Status: DC
  Administered 2017-04-07: 11:00:00 37.5 mg via ORAL
  Filled 2017-04-07: qty 1

## 2017-04-07 MED ORDER — FUROSEMIDE 20 MG PO TABS
20.0000 mg | ORAL_TABLET | Freq: Every day | ORAL | Status: DC
  Administered 2017-04-07: 20 mg via ORAL
  Filled 2017-04-07: qty 1

## 2017-04-07 NOTE — Clinical Social Work Note (Signed)
Clinical Social Work Assessment  Patient Details  Name: Amanda [name] MRN: [id number] Date of Birth: [birth year]  Date of referral:  04/07/17               Reason for consult:  Facility Placement(from [facility])                Permission sought to share information with:  Facility Sport and exercise psychologist, Family Supports Permission granted to share information::  Yes, Verbal Permission Granted  Name::     Theresa Wedel  Agency::  [employer]  Relationship::  son  Contact Information:  [phone]  Housing/Transportation Living arrangements for the past 2 months:  [place] of Information:  Adult Children, Facility, Spouse Patient Interpreter Needed:  None Criminal Activity/Legal Involvement Pertinent to Current Situation/Hospitalization:  No - Comment as needed Significant Relationships:  Adult Children, Spouse Lives with:  Facility Resident Do you feel safe going back to the place where you live?  Yes Need for family participation in patient care:  Yes (Comment)  Care giving concerns: Patient is [facility] care [facility] at [facility].   Social Worker assessment / plan: CSW met with patient, son, and spouse at bedside. Patient lethargic but acknowledged CSW and nodded and smiled. Family hopeful for patient to return to her SNF. CSW spoke to admissions at [facility] and they are able to take patient back when medically ready. CSW to follow and support with discharge. Note palliative following; CSW will support with having palliative follow at SNF if indicated and family agreeable.  Employment status:  Retired Forensic scientist:  [employer] PT Recommendations:  [place] / Referral to community resources:  [place]  Patient/Family's Response to care: Family appreciative of care.  Patient/Family's Understanding of and Emotional Response to Diagnosis, Current Treatment, and Prognosis: Family  with understanding of patient's condition.  Emotional Assessment Appearance:  Appears stated age Attitude/Demeanor/Rapport:  Lethargic Affect (typically observed):  Calm Orientation:  Oriented to Self Alcohol / Substance use:  Not Applicable Psych involvement (Current and /or in the community):  No (Comment)  Discharge Needs  Concerns to be addressed:  Discharge Planning Concerns, Care Coordination Readmission within the last 30 days:  No Current discharge risk:  Physical Impairment, Cognitively Impaired Barriers to Discharge:  Continued Medical Work up   Estanislado Emms, LCSW 04/07/2017, 10:12 AM

## 2017-04-07 NOTE — Progress Notes (Signed)
[facility]. Anticipated discharge date: 04/07/17 Family notified: Theda Belfastommy Garbers, son Transportation by: [facility]  Nurse to call report to [phone].   CSW signing off.  Abigail ButtsSusan Caisley Baxendale, LCSWA  Clinical Social Worker

## 2017-04-07 NOTE — Progress Notes (Signed)
[facility] given [facility].  Hinton DyerYoko Espen Bethel, RN

## 2017-04-07 NOTE — [place] FL2 (Signed)
[place] MEDICAID FL2 LEVEL OF CARE SCREENING TOOL     IDENTIFICATION  Patient Name: Amanda [name] Birthdate: 09/13/1922 Sex: female Admission Date (Current Location): 04/04/2017  [facility] [place]   Facility and Address:  [facility] [place], [address], [place], [place] [address]      Provider Number: [id number]  Attending Physician Name and Address:  Carney Livinghambliss, Marshall L, MD  Relative Name and Phone Number:  Theda Belfastommy Wichmann, son, [phone]    Current Level of Care: Hospital Recommended Level of Care: Skilled Nursing Facility Prior Approval Number:    Date Approved/Denied:   PASRR Number: [id number]  Discharge Plan: SNF    Current Diagnoses: Patient Active Problem List   Diagnosis Date Noted  . Protein-calorie malnutrition, severe 04/05/2017  . Acute congestive heart failure (HCC)   . Palliative care by specialist   . DNR (do not resuscitate) discussion   . Weakness generalized   . A-fib (HCC) 04/04/2017  . Rapid atrial fibrillation (HCC) 04/04/2017  . Acute diverticulitis 09/15/2016  . Unspecified open wound, right hip, subsequent encounter 09/15/2016  . Pressure injury of skin 09/10/2016  . Thoracic compression fracture, closed, initial encounter (HCC) 09/09/2016  . Right inguinal hernia 05/14/2015  . Atrial fibrillation, persistent (HCC) 02/21/2015  . Femoral neck fracture (HCC) 04/23/2014  . Hip fracture due to osteoporosis (HCC) 04/23/2014  . Long term current use of anticoagulant therapy 10/07/2010  . Essential hypertension 07/21/2009  . CHEST PAIN 07/11/2009  . Hypothyroidism 07/03/2009  . Hyperlipidemia 07/03/2009  . GERD 07/03/2009  . OSTEOARTHRITIS, KNEE 07/03/2009  . Intractable back pain 07/03/2009  . Osteoporosis 07/03/2009    Orientation RESPIRATION BLADDER Height & Weight     Self  Normal Incontinent, External catheter Weight: 104 lb 0.9 oz (47.2 kg) Height:  4\' 11"  (149.9 cm)  BEHAVIORAL  SYMPTOMS/MOOD NEUROLOGICAL BOWEL NUTRITION STATUS        Diet(please see DC summary)  AMBULATORY STATUS COMMUNICATION OF NEEDS Skin   Total Care Verbally PU Stage and Appropriate Care(PU unstageable R hip, foam dressing; PU stage I sacrum, foam dressing)                       Personal Care Assistance Level of Assistance  Bathing, Feeding, Dressing Bathing Assistance: Maximum assistance Feeding assistance: Maximum assistance Dressing Assistance: Maximum assistance     Functional Limitations Info  Sight, Hearing, Speech Sight Info: Adequate Hearing Info: Adequate Speech Info: Adequate    SPECIAL CARE FACTORS FREQUENCY  PT (By licensed PT), OT (By licensed OT)       OT Frequency: 1x/week            Contractures Contractures Info: Not present    Additional Factors Info  Code Status, Allergies, Psychotropic, Isolation Precautions Code Status Info: DNR Allergies Info: Alendronate, Aspirin, Aspirin-acetaminophen-caffeine, Nsaids, Sulfamethoxazole, Sulfonamide Derivatives, Cephalexin, Codeine Psychotropic Info: seroquel   Isolation Precautions Info: contact precautions, MRSA     Current Medications (04/07/2017):  This is the current hospital active medication list Current Facility-Administered Medications  Medication Dose Route Frequency Provider Last Rate Last Dose  . 0.9 %  sodium chloride infusion  250 mL Intravenous PRN Ellwood Denseumball, Alison, DO      . Chlorhexidine Gluconate Cloth 2 % PADS 6 each  6 each Topical Q0600 Carney Livinghambliss, Marshall L, MD   6 each at 04/07/17 0501  . feeding supplement (ENSURE ENLIVE) (ENSURE ENLIVE) liquid 237 mL  237 mL Oral BID BM  Carney Living, MD   237 mL at 04/06/17 1026  . furosemide (LASIX) tablet 20 mg  20 mg Oral Daily Laverda Page B, NP      . guaiFENesin (MUCINEX) 12 hr tablet 600 mg  600 mg Oral BID PRN Lennox Solders, MD   600 mg at 04/06/17 0929  . levothyroxine (SYNTHROID, LEVOTHROID) tablet 88 mcg  88 mcg Oral QAC  breakfast Ellwood Dense, DO   88 mcg at 04/06/17 0800  . metoprolol tartrate (LOPRESSOR) tablet 37.5 mg  37.5 mg Oral BID Laverda Page B, NP      . mupirocin ointment (BACTROBAN) 2 % 1 application  1 application Nasal BID Carney Living, MD   1 application at 04/06/17 2019  . off the beat book   Does not apply Once Carney Living, MD      . ondansetron [facility]) tablet 4 mg  4 mg Oral Q6H PRN Ellwood Dense, DO       Or  . ondansetron (ZOFRAN) injection 4 mg  4 mg Intravenous Q6H PRN Ellwood Dense, DO      . polyethylene glycol (MIRALAX / GLYCOLAX) packet 17 g  17 g Oral Daily PRN Ellwood Dense, DO      . QUEtiapine (SEROQUEL) tablet 25 mg  25 mg Oral BID Ellwood Dense, DO   25 mg at 04/06/17 2019  . sodium chloride flush (NS) 0.9 % injection 3 mL  3 mL Intravenous Q12H Ellwood Dense, DO   3 mL at 04/06/17 2020  . sodium chloride flush (NS) 0.9 % injection 3 mL  3 mL Intravenous PRN Ellwood Dense, DO      . warfarin (COUMADIN) tablet 2 mg  2 mg Oral Once per day on Mon Wed Fri Scarlett Presto, [facility]   2 mg at 04/06/17 1610  . warfarin (COUMADIN) tablet 4 mg  4 mg Oral Once per day on Sun Tue Thu Sat Scarlett Presto, [facility]      . Warfarin - Pharmacist Dosing Inpatient   Does not apply q1800 Quenton Fetter [facility]         Discharge Medications: Please see discharge summary for a list of discharge medications.  Relevant Imaging Results:  Relevant Lab Results:   Additional Information SSN: [id number]  Abigail Butts, LCSW

## 2017-04-07 NOTE — Progress Notes (Signed)
Family Medicine Teaching Service Daily Progress Note Intern Pager: [phone]  Patient name: Amanda [name] Medical record number: [id number] Date of birth: 06-Jul-1922 Age: 84 y.o. Gender: female  Primary Care Provider: Barbie Banner, MD Consultants: cardiology, palliative care Code Status: DNR  Pt Overview and Major Events to Date:  1/28: admitted to fpts, cardiology consulted, cardizem gtt stopped 1/29: continued diuresis with lasix, consulted palliative care for continued GOC conversation 1/30: continued diuresis, palliative care consult, DNR  Assessment and Plan: Mariely B Thomasis a 84 y.o.femalewith PMH significant forA fib, dementia, hypothyroidism, HLD, HTN, GERD, osteoporosis who presented to the ED from her SNF 1/28 with worsening cough and SOB over several days.   Acute hypoxic respiratory failure HFrEF Exacerbation: Pt presents with several days of worsening SOB and cough. Echo 1/29 with EF 20-25% (last echo 2015, EF 60-65%). Has been diuresed with lasix 20mg  IV since admission with significant improvement in respiratory exam. Today, patient is resting  comfortably, satting 100% on RA. Given improvement with diuresis and echo result, likely etiology of respiratory failure is worsening HF with elevated troponins 2/2 volume overload resulting in demand ischemia.Unlikely infection given afebrile, nl WBC, and improvement with diuresis. - S/P 1 day Vanc/Cefepime (1/28); unlikely infectious but can consider re-initiating if patient does not improve with further diuresis or fevers - Cardiology following, appreciate recs - continuous cardiac monitoring and pulse ox -dailyCBC, CMP - daily weights, strict I&Os, IS - Lasix 20mg  PO today, plan to d/c with lasix 20mg  PO daily  Paroxysmal Afib: OAC with warfarin. INR 1.92 today. Presented with RVR initally treated with dilt GTT but d/c'ed due to subsquent bradycardia. Pt was not on any rate or rhythm control outpatient (provider at  SNF stopped metoprolol for unclear reasons). HR overnight 99-131.  -Increase metoprolol to 37.5mg  BID from 25mg  BID -dailyPT/INR  Dementia with behavioral disturbance: Resident at [facility] mo prior to admission. Previously at [facility] for rehab after last hospitalization in 09/2016. On Seroquel, Ativan at home. - continue home seroquel, hold home ativan,can restartif becomes agitated - Palliative Care consult; would be a good hospice candidate if family amenable  Hypertension: BP at goal during admission.  - metoprolol as above  Osteoporosis: Previous hospitalization 09/2016 for thoracic compression fracture. - CTM  Hypothyroidism: On synthroid at home, TSH wnl on admission.  - continue home synthroid  FEN/GI:regular diet Prophylaxis:home anticoagulation with warfarin  Disposition:snf vs hospice   Subjective:  No family at bedside. Patient pleasant but confused, asking about son and husband. Eating small amount of breakfast during interview.   Objective: Temp:  [97.8 F (36.6 C)-99.5 F (37.5 C)] 98 F (36.7 C) (01/31 0408) Pulse Rate:  [94-130] 130 (01/30 2004) Resp:  [20-28] 23 (01/31 0408) BP: (108-140)/(79-87) 140/81 (01/31 0408) SpO2:  [95 %-100 %] 98 % (01/31 0408) Weight:  [104 lb 0.9 oz (47.2 kg)] 104 lb 0.9 oz (47.2 kg) (01/31 0408)   Physical Exam: General: frail, elderly appearing, in NAD, cachetic. Oriented to person but not place or time. Perseverative on "the boys outside" but easily redirectable.  HEENT: NCAT, conjunctivae anicteric Cardiovascular: irregularly irregular, tachycardiac without murmurs, rubs or gallops Respiratory: poor inspiratory effort, satting 99% on RA. LL crackles remain present bilaterally Abdomen: soft, nontender, non-distended, normoactive bowel sounds Extremities: 1+ pitting edema to lower-shin bilaterally, 2+DP and radial pulses. WWP.   Laboratory: Recent Labs  Lab 04/04/17 1023 04/04/17 1047  04/05/17 0406 04/06/17 0340  WBC 11.4*  --  8.8 9.5  HGB 10.1* 11.6* 9.5* 9.9*  HCT 33.0* 34.0* 30.0* 31.8*  PLT 312  --  294 286   Recent Labs  Lab 04/04/17 1324 04/05/17 0406 04/06/17 0340  NA 134* 134* 135  K 5.3* 4.7 4.1  CL 102 102 101  CO2 22 19* 24  BUN 18 19 18   CREATININE 1.12* 1.01* 1.14*  CALCIUM 8.4* 8.2* 8.3*  PROT 7.3 6.2* 6.2*  BILITOT 0.7 0.7 0.5  ALKPHOS 114 95 97  ALT 51 56* 47  AST 106* 100* 61*  GLUCOSE 154* 112* 99   Imaging/Diagnostic Tests: none  Desiree HaneMichaels, Arianna, Medical Student 04/07/2017, 7:17 AM   I have personally seen and examined this patient with Earvin HansenArianna Michales and agree with the above note. The following is my additional documentation.   Physical Exam: General: frail elderly woman eating lunch, NAD with non-toxic appearance HEENT: normocephalic, atraumatic, moist mucous membranes Neck: supple, non-tender without lymphadenopathy, no JVD Cardiovascular: regular rate and rhythm without murmurs, rubs, or gallops Lungs: clear to auscultation bilaterally with normal work of breathing Abdomen: soft, non-tender, non-distended, normoactive bowel sounds Skin: warm, dry, no rashes or lesions, cap refill < 2 seconds Extremities: warm and well perfused, normal tone, no edema  Assessment/Plan Jamse ArnHallie B Aylward is a 84 y.o. female presenting with fluid overload with decrease EF by echo following resolved atrial fibrillation with RVR. PMH significant forA fib, dementia, hypothyroidism, HLD, HTN, GERD, osteoporosis.  HFrEF: Improved.  Worsened EF on echocardiogram during admission.  Cardiology recommending increase in Lopressor 37.5 mg twice daily and Lasix 20 mg daily.  Patient appears to be euvolemic and likely near dry weight. Paroxysmal atrial fibrillation: Chronic.  Rate controlled with increased Lopressor 37.5 mg twice daily.  Likely need to titrate based on outpatient HR. Dementia with behavioral disturbance: Chronic.  [facility] to [facility].  Continuing home Seroquel.  Hypertension: Chronic.  Normotensive.  On Lopressor 37.5 mg twice daily. Hypothyroidism: Chronic.  On Synthroid 88 mcg.  TSH WNL on admission. OA: Chronic.  Previous hospitalization 09/2016 for thoracic compression fracture.  Continue to monitor outpatient.  Dispo: Patient medically stable [facility] 04/07/2017.  Durward Parcelavid Vidhi Delellis, DO [facility], PGY-2

## 2017-04-07 NOTE — Progress Notes (Addendum)
Progress Note  Patient Name: Amanda [name] Date of Encounter: 04/07/2017  Primary Cardiologist: Delton SeeNelson  Subjective   Breathing has improved this morning.   Inpatient Medications    Scheduled Meds: . Chlorhexidine Gluconate Cloth  6 each Topical Q0600  . feeding supplement (ENSURE ENLIVE)  237 mL Oral BID BM  . levothyroxine  88 mcg Oral QAC breakfast  . metoprolol tartrate  25 mg Oral BID  . mupirocin ointment  1 application Nasal BID  . off the beat book   Does not apply Once  . QUEtiapine  25 mg Oral BID  . sodium chloride flush  3 mL Intravenous Q12H  . warfarin  2 mg Oral Once per day on Mon Wed Fri  . warfarin  4 mg Oral Once per day on Sun Tue Thu Sat  . Warfarin - Pharmacist Dosing Inpatient   Does not apply q1800   Continuous Infusions: . sodium chloride     PRN Meds: sodium chloride, guaiFENesin, ondansetron **OR** ondansetron (ZOFRAN) IV, polyethylene glycol, sodium chloride flush   Vital Signs    Vitals:   04/06/17 1605 04/06/17 2004 04/07/17 0006 04/07/17 0408  BP: 131/79 124/87 108/79 140/81  Pulse:  (!) 130    Resp:  (!) 28 (!) 24 (!) 23  Temp: 99.5 F (37.5 C) 98.6 F (37 C) 97.8 F (36.6 C) 98 F (36.7 C)  TempSrc: Axillary Oral Oral Oral  SpO2: 99% 97% 100% 98%  Weight:    104 lb 0.9 oz (47.2 kg)  Height:        Intake/Output Summary (Last 24 hours) at 04/07/2017 [id number] Last data filed at 04/07/2017 0411 Gross per 24 hour  Intake 240 ml  Output 1950 ml  Net -1710 ml   Filed Weights   04/05/17 0445 04/06/17 0430 04/07/17 0408  Weight: 109 lb 2 oz (49.5 kg) 106 lb 4.2 oz (48.2 kg) 104 lb 0.9 oz (47.2 kg)    Telemetry    Afib rates 120s - Personally Reviewed  Physical Exam   General: Thin, very frail older W female appearing in no acute distress. Head: Normocephalic, atraumatic.  Neck: Supple, no JVD. Lungs:  Resp regular and unlabored, diminished with mild rhonchi. Heart: Irreg Irreg, S1, S2, no S3, S4, or murmur; no  rub. Abdomen: Soft, non-tender, non-distended with normoactive bowel sounds.  Extremities: No clubbing, cyanosis, edema. Distal pedal pulses are 2+ bilaterally. Neuro: Alert and oriented X 3. Moves all extremities spontaneously. Psych: Normal affect.  Labs    Chemistry Recent Labs  Lab 04/04/17 1324 04/05/17 0406 04/06/17 0340  NA 134* 134* 135  K 5.3* 4.7 4.1  CL 102 102 101  CO2 22 19* 24  GLUCOSE 154* 112* 99  BUN 18 19 18   CREATININE 1.12* 1.01* 1.14*  CALCIUM 8.4* 8.2* 8.3*  PROT 7.3 6.2* 6.2*  ALBUMIN 2.5* 2.2* 2.1*  AST 106* 100* 61*  ALT 51 56* 47  ALKPHOS 114 95 97  BILITOT 0.7 0.7 0.5  GFRNONAA 41* 47* 40*  GFRAA 48* 54* 47*  ANIONGAP 10 13 10      Hematology Recent Labs  Lab 04/04/17 1023 04/04/17 1047 04/05/17 0406 04/06/17 0340  WBC 11.4*  --  8.8 9.5  RBC 3.75*  --  3.40* 3.62*  HGB 10.1* 11.6* 9.5* 9.9*  HCT 33.0* 34.0* 30.0* 31.8*  MCV 88.0  --  88.2 87.8  MCH 26.9  --  27.9 27.3  MCHC 30.6  --  31.7 31.1  RDW 16.7*  --  17.2* 16.7*  PLT 312  --  294 286    Cardiac Enzymes Recent Labs  Lab 04/04/17 1324 04/04/17 1621 04/04/17 2154 04/05/17 0406  TROPONINI 5.49* 4.43* 5.68* 4.17*    Recent Labs  Lab 04/04/17 1041  TROPIPOC 5.87*     BNP Recent Labs  Lab 04/04/17 1023  BNP 511.3*     DDimer No results for input(s): DDIMER in the last 168 hours.    Radiology    No results found.  Cardiac Studies   TTE: 04/05/17  Study Conclusions  - Left ventricle: The cavity size was normal. Septal wall thickness was increased in a pattern of mild LVH. Systolic function was severely reduced. The estimated ejection fraction was in the range of 20% to 25%. Diffuse hypokinesis. - Aortic valve: Transvalvular velocity was within the normal range. There was no stenosis. There was mild regurgitation. - Mitral valve: Transvalvular velocity was within the normal range. There was no evidence for stenosis. There was mild  regurgitation. - Left atrium: The atrium was severely dilated. - Right ventricle: The cavity size was normal. Wall thickness was normal. Systolic function was moderately reduced. - Right atrium: The atrium was severely dilated. - Tricuspid valve: There was severe regurgitation. - Pulmonary arteries: Systolic pressure was mildly increased. PA peak pressure: 47 mm Hg (S).    Patient Profile     84 y.o. female  with permanent atrial fibrillation on warfarin presenting with acute respiratory failure and signs of acute on chronic combined heart failure.  Assessment & Plan    1. Acute systolic HF: CXR with bilateral effusions on admission. Echo showed significant decline in EF to 25% with diffuse hypokinesis. Given her age and co morbidities she is not a candidate for invasive testing.  Responded well to IV lasix yesterday with good UOP, and respiratory status has improved. Suspect she may need maintenance dose of lasix. -- will add lasix 20mg  daily  2. Afib RVR: rate variable  -- on BB, can try to titrate this if blood pressures tolerates -- On coumadin for OAC.   3. Elevated troponin: Peaked at 5.68, echo shows decline in EF to 25%. Its certainly possible she has underlying coronary disease but at her age with dementia and frail state she is not a candidate for invasive therapies. Would continue to treat medically at this time.   - She's now been made a DNR, family was at the bedside.   Signed, Laverda Page, NP  04/07/2017, 9:21 AM  Pager # [phone]   For questions or updates, please contact [facility] Please consult [email or web address] for contact info under Cardiology/STEMI.  I have personally seen and examined this patient. I agree with the assessment and plan as outlined above.  She looks better overall. HR is 120s. Agree with increasing Lopressor as BP tolerates to 37.5 mg po BID.   Agree with changing to po Lasix today No plans for further cardiac workup. I have  reviewed her LV systolic dysfunction with her son and husband. I am not sure her BP would tolerate addition of an Ace-inh or ARB.  I think palliative care is appropriate for her.   Verne Carrow 04/07/2017 10:02 AM

## 2017-04-07 NOTE — Clinical Social Work Placement (Signed)
   CLINICAL SOCIAL WORK PLACEMENT  NOTE  Date:  04/07/2017  Patient Details  Name: Amanda [name] MRN: [id number] Date of Birth: 08/13/1922  Clinical Social Work is seeking post-discharge placement for this patient at the Skilled  Nursing Facility level of care (*CSW will initial, date and re-position this form in  chart as items are completed):  Yes   Patient/family provided with [place] Clinical Social Work Department's list of facilities offering this level of care within the geographic area requested by the patient (or if unable, by the patient's family).  Yes   Patient/family informed of their freedom to choose among providers that offer the needed level of care, that participate in Medicare, Medicaid or managed care program needed by the patient, have an available bed and are willing to accept the patient.  Yes   Patient/family informed of [place]'s ownership interest in [facility] and [facility], as well as of the fact that they are under no obligation to receive care at these facilities.  PASRR submitted to EDS on 04/07/17     PASRR number received on 04/07/17     Existing PASRR number confirmed on       FL2 transmitted to all facilities in geographic area requested by pt/family on 04/07/17     FL2 transmitted to all facilities within larger geographic area on       Patient informed that his/her managed care company has contracts with or will negotiate with certain facilities, including the following:  [facility]     Yes   Patient/family informed of bed offers received.  [facility]     Physician recommends and patient chooses bed at      [facility].  Patient to be transferred to facility by [facility]     Patient family notified on 04/07/17 of transfer.  Name of family member notified:  [name], son     PHYSICIAN Please prepare priority discharge summary, including  medications, Please prepare prescriptions     Additional Comment:    _______________________________________________ Abigail ButtsSusan Lailoni Baquera, LCSW 04/07/2017, 1:48 PM

## 2017-04-07 NOTE — Progress Notes (Signed)
ANTICOAGULATION CONSULT NOTE - Follow Up Consult  Pharmacy Consult for Coumadin Indication: atrial fibrillation  Allergies  Allergen Reactions  . Alendronate Other (See Comments)    GI Upset (intolerance) At risk for GI perforation  . Aspirin Diarrhea and Other (See Comments)    Melena, black tarry stools  . Aspirin-Acetaminophen-Caffeine Other (See Comments)    Melena, Dark stool  . Nsaids Other (See Comments)    Patient has ulcer   . Sulfamethoxazole Hives  . Sulfonamide Derivatives Hives  . Cephalexin Rash  . Codeine Nausea And Vomiting    GI upset    Patient Measurements: Height: 4\' 11"  (149.9 cm) Weight: 104 lb 0.9 oz (47.2 kg) IBW/kg (Calculated) : 43.2  Vital Signs: Temp: 98 F (36.7 C) (01/31 0408) Temp Source: Oral (01/31 0408) BP: 140/81 (01/31 0408) Pulse Rate: 130 (01/30 2004)  Labs: Recent Labs    04/04/17 1023 04/04/17 1047  04/04/17 1324 04/04/17 1621 04/04/17 2154 04/05/17 0406 04/06/17 0340 04/07/17 0501  HGB 10.1* 11.6*  --   --   --   --  9.5* 9.9*  --   HCT 33.0* 34.0*  --   --   --   --  30.0* 31.8*  --   PLT 312  --   --   --   --   --  294 286  --   LABPROT 22.1*  --   --   --   --   --  23.6* 24.0* 21.8*  INR 1.95  --   --   --   --   --  2.12 2.17 1.92  CREATININE 1.13* 1.00  --  1.12*  --   --  1.01* 1.14*  --   TROPONINI  --   --    < > 5.49* 4.43* 5.68* 4.17*  --   --    < > = values in this interval not displayed.    Estimated Creatinine Clearance: 21.5 mL/min (A) (by C-G formula based on SCr of 1.14 mg/dL (H)).   Assessment: [age] YOF continues on Coumadin from PTA for history of Afib.  INR slightly below goal, but she is scheduled to receive a larger Coumadin dose today.  Would follow INR trend and increase maintenance dose if appropriate.  No bleeding reported.  Most recent Coumadin regimen: 2 mg MWF, 4 mg TTSS.  Since 03/28/17.   Goal of Therapy:  INR 2-3 Monitor platelets by anticoagulation protocol: Yes    Plan:   Coumadin 4mg  PO daily except 2mg  on MWF - 4mg  today Daily PT / INR   Kholton Coate D. Laney Potashang, PharmD, BCPS Pager:  [phone] 04/07/2017, 7:20 AM

## 2017-04-09 LAB — CULTURE, BLOOD (ROUTINE X 2)
Culture: NO GROWTH
Culture: NO GROWTH
SPECIAL REQUESTS: ADEQUATE
SPECIAL REQUESTS: ADEQUATE

## 2017-05-03 ENCOUNTER — Telehealth: Payer: Self-pay | Admitting: Cardiology

## 2017-05-03 NOTE — Telephone Encounter (Signed)
Patient daughter(Delores) calling,  States that mother is in nursing facility and was diagnosed with CHF. Patient currently takes Lasix medication and is on oxygen. Delores would like to know if there was any medication that she could take that would improve "heart function?"

## 2017-05-03 NOTE — Telephone Encounter (Signed)
I have personally seen and examined this patient. I agree with the assessment and plan as outlined above.  She looks better overall. HR is 120s. Agree with increasing Lopressor as BP tolerates to 37.5 mg po BID.   Agree with changing to po Lasix today No plans for further cardiac workup. I have reviewed her LV systolic dysfunction with her son and husband. I am not sure her BP would tolerate addition of an Ace-inh or ARB.  I think palliative care is appropriate for her.   Verne CarrowChristopher McAlhany 04/07/2017 10:02 AM  Daughter just calling to update Dr Delton SeeNelson on this pt.  Above is the pts last assessment note done prior to discharge to palliative care [facility].  Daughter will call if further assistance is needed from a cardiac standpoint from Dr Delton SeeNelson.  Will send to Dr Delton SeeNelson as an Lorain ChildesFYI.

## 2018-08-15 IMAGING — CR DG LUMBAR SPINE COMPLETE 4+V
6 series · 6 of 6 positions shown · non-contrast
Comparison: None.

CLINICAL DATA: Low back pain without known injury.

EXAM:
LUMBAR SPINE - COMPLETE 4+ VIEW

[t lumbar spine lat]
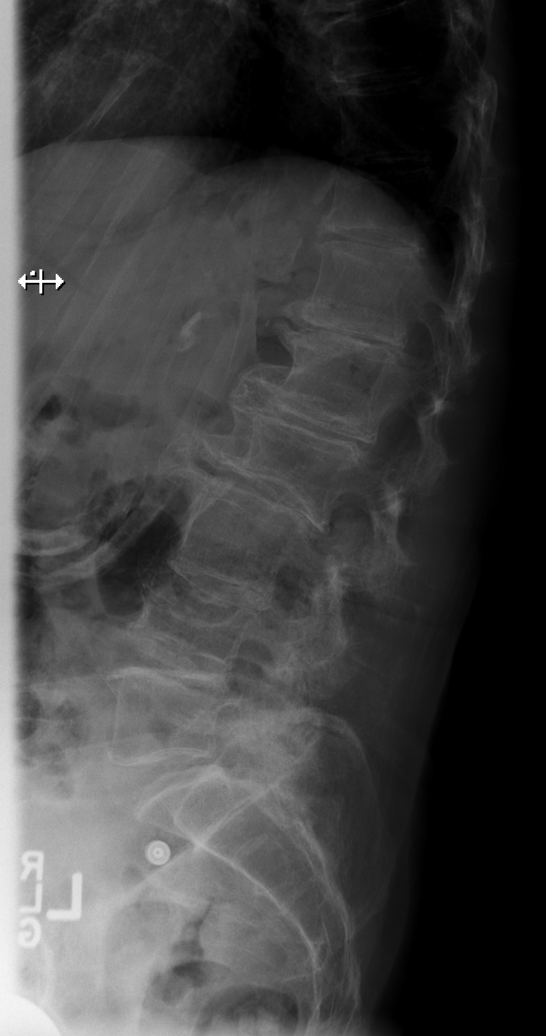

[t lumbar l-5 s-1 spot]
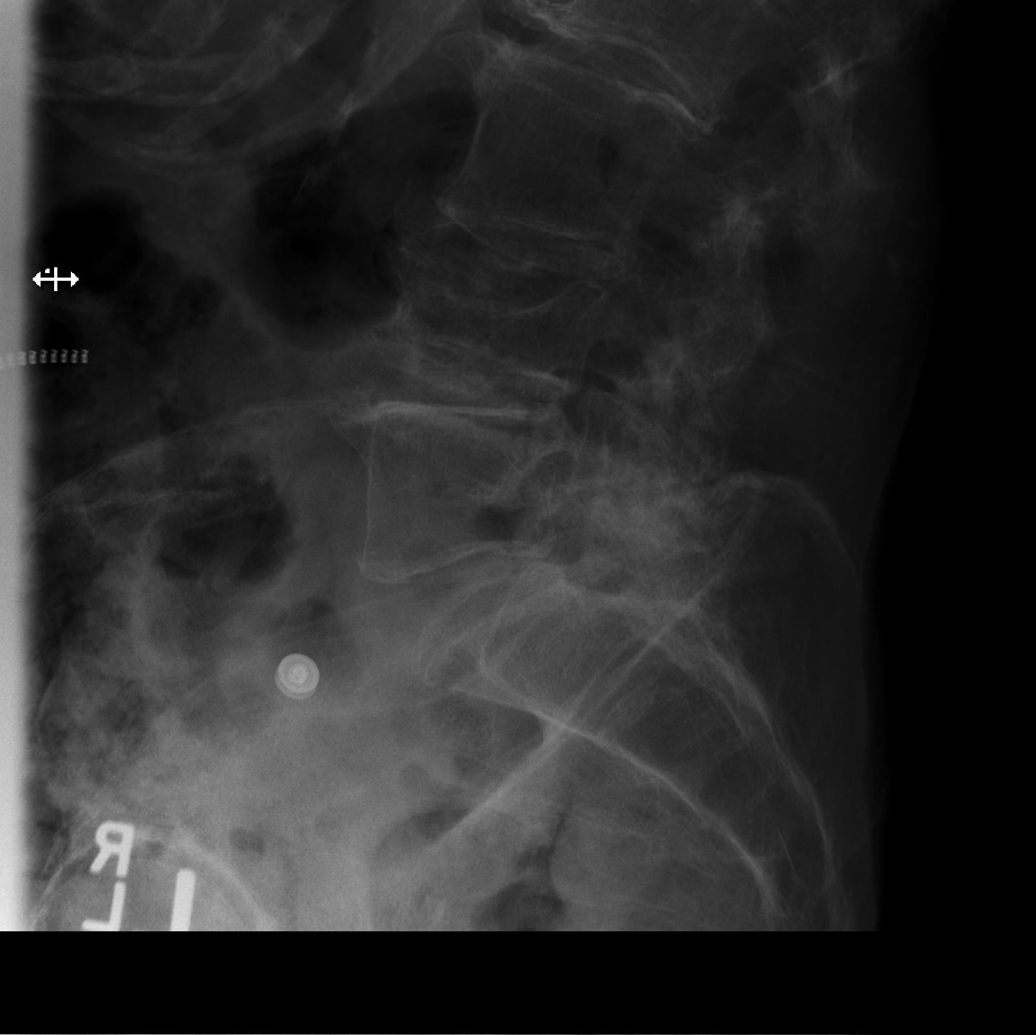

[t lumbar spine obl (1 of 3)]
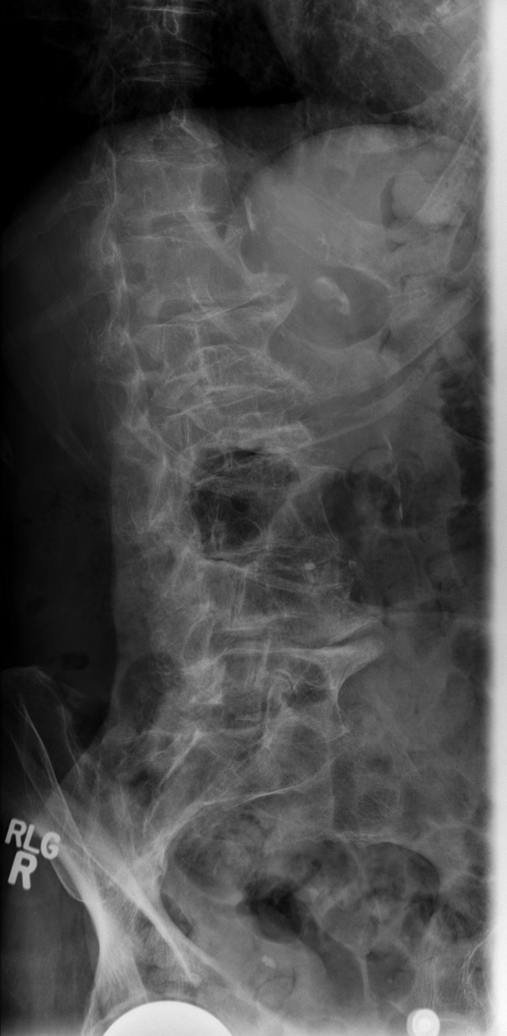

[t lumbar spine ap]
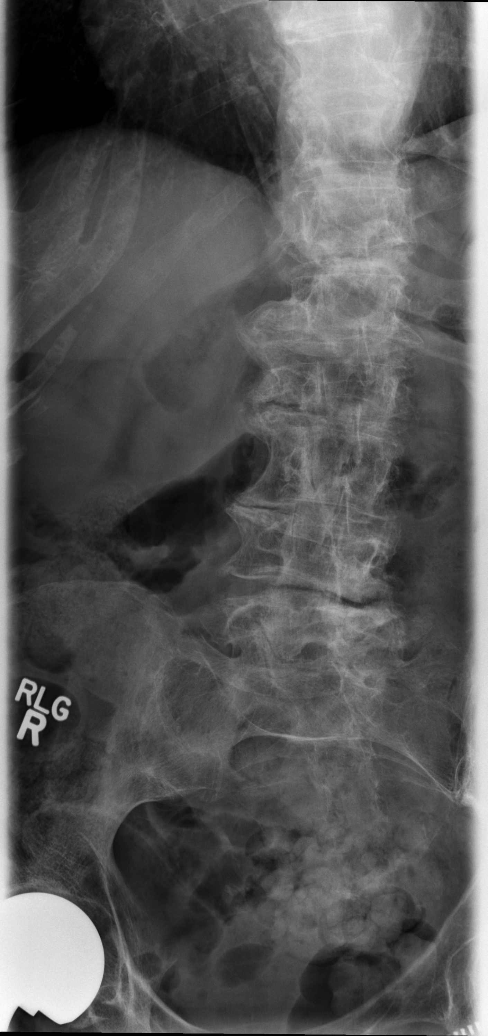

[t lumbar spine obl (2 of 3)]
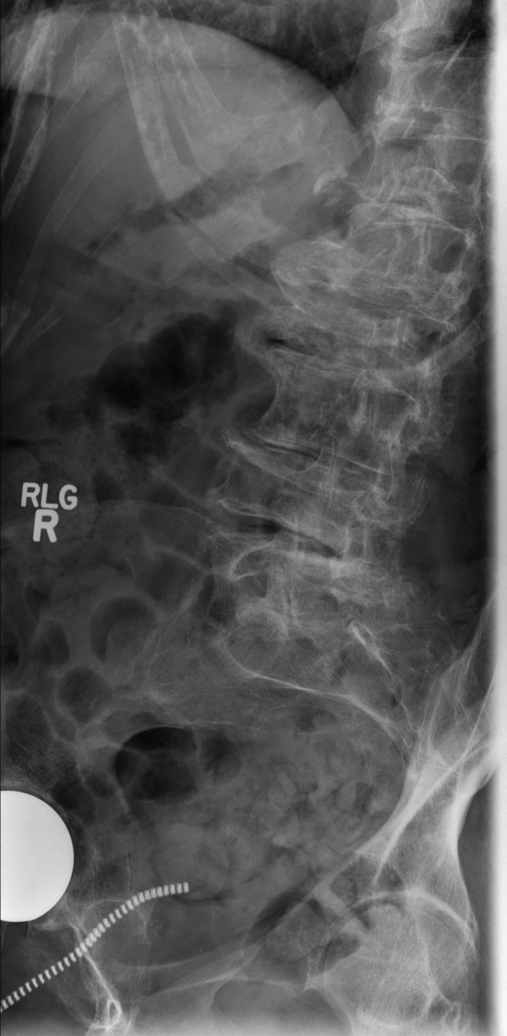

[t lumbar spine obl (3 of 3)]
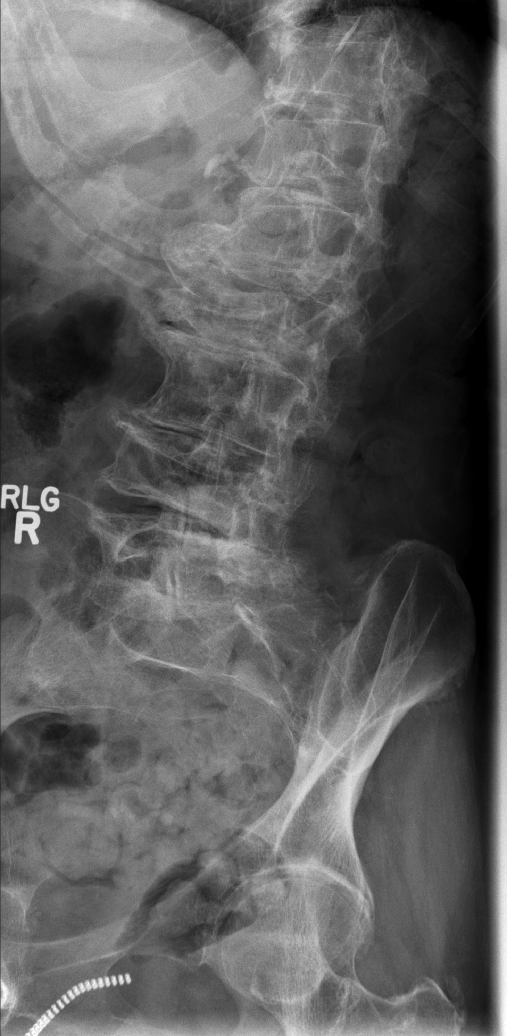

[6 of 6 positions shown; findings below may reference images not displayed]

FINDINGS: Mild grade 1 retrolisthesis of L2-3 is noted secondary to
degenerative disc disease at this level. Severe degenerative disc
disease is noted at T12-L1, L1-2 and L2-3. Mild degenerative disc
disease is noted at L4-5. Moderate compression deformity L4
vertebral body is noted consistent with fracture of indeterminate
age.
IMPRESSION: Severe multilevel degenerative disc disease. Moderate compression
deformity L4 vertebral body is noted which may represent old
fracture, but if patient is symptomatic in this area, MRI is
recommended to evaluate for possible acute fracture.

## 2019-03-29 ENCOUNTER — Other Ambulatory Visit: Payer: Self-pay

## 2019-03-29 ENCOUNTER — Emergency Department (HOSPITAL_COMMUNITY): Payer: Medicare Other

## 2019-03-29 ENCOUNTER — Inpatient Hospital Stay (HOSPITAL_COMMUNITY)
Admission: EM | Admit: 2019-03-29 | Discharge: 2019-04-09 | DRG: 871 | Disposition: E | Payer: Medicare Other | Source: Skilled Nursing Facility | Attending: Internal Medicine | Admitting: Internal Medicine

## 2019-03-29 ENCOUNTER — Encounter (HOSPITAL_COMMUNITY): Payer: Self-pay | Admitting: *Deleted

## 2019-03-29 DIAGNOSIS — Z6821 Body mass index (BMI) 21.0-21.9, adult: Secondary | ICD-10-CM

## 2019-03-29 DIAGNOSIS — M810 Age-related osteoporosis without current pathological fracture: Secondary | ICD-10-CM | POA: Diagnosis present

## 2019-03-29 DIAGNOSIS — M1990 Unspecified osteoarthritis, unspecified site: Secondary | ICD-10-CM | POA: Diagnosis present

## 2019-03-29 DIAGNOSIS — E872 Acidosis: Secondary | ICD-10-CM | POA: Diagnosis present

## 2019-03-29 DIAGNOSIS — N179 Acute kidney failure, unspecified: Secondary | ICD-10-CM | POA: Diagnosis present

## 2019-03-29 DIAGNOSIS — N1832 Chronic kidney disease, stage 3b: Secondary | ICD-10-CM | POA: Diagnosis present

## 2019-03-29 DIAGNOSIS — Z8711 Personal history of peptic ulcer disease: Secondary | ICD-10-CM

## 2019-03-29 DIAGNOSIS — U071 COVID-19: Secondary | ICD-10-CM

## 2019-03-29 DIAGNOSIS — Z96641 Presence of right artificial hip joint: Secondary | ICD-10-CM | POA: Diagnosis present

## 2019-03-29 DIAGNOSIS — E785 Hyperlipidemia, unspecified: Secondary | ICD-10-CM | POA: Diagnosis present

## 2019-03-29 DIAGNOSIS — N390 Urinary tract infection, site not specified: Secondary | ICD-10-CM | POA: Diagnosis present

## 2019-03-29 DIAGNOSIS — Z7189 Other specified counseling: Secondary | ICD-10-CM

## 2019-03-29 DIAGNOSIS — R627 Adult failure to thrive: Secondary | ICD-10-CM | POA: Diagnosis present

## 2019-03-29 DIAGNOSIS — E039 Hypothyroidism, unspecified: Secondary | ICD-10-CM | POA: Diagnosis present

## 2019-03-29 DIAGNOSIS — J9601 Acute respiratory failure with hypoxia: Secondary | ICD-10-CM | POA: Diagnosis present

## 2019-03-29 DIAGNOSIS — F419 Anxiety disorder, unspecified: Secondary | ICD-10-CM | POA: Diagnosis present

## 2019-03-29 DIAGNOSIS — I130 Hypertensive heart and chronic kidney disease with heart failure and stage 1 through stage 4 chronic kidney disease, or unspecified chronic kidney disease: Secondary | ICD-10-CM | POA: Diagnosis present

## 2019-03-29 DIAGNOSIS — Y95 Nosocomial condition: Secondary | ICD-10-CM | POA: Diagnosis present

## 2019-03-29 DIAGNOSIS — J1282 Pneumonia due to coronavirus disease 2019: Secondary | ICD-10-CM | POA: Diagnosis present

## 2019-03-29 DIAGNOSIS — Z7901 Long term (current) use of anticoagulants: Secondary | ICD-10-CM | POA: Diagnosis not present

## 2019-03-29 DIAGNOSIS — I4819 Other persistent atrial fibrillation: Secondary | ICD-10-CM | POA: Diagnosis present

## 2019-03-29 DIAGNOSIS — K219 Gastro-esophageal reflux disease without esophagitis: Secondary | ICD-10-CM | POA: Diagnosis present

## 2019-03-29 DIAGNOSIS — Z66 Do not resuscitate: Secondary | ICD-10-CM | POA: Diagnosis present

## 2019-03-29 DIAGNOSIS — A4189 Other specified sepsis: Principal | ICD-10-CM | POA: Diagnosis present

## 2019-03-29 DIAGNOSIS — Z79890 Hormone replacement therapy (postmenopausal): Secondary | ICD-10-CM

## 2019-03-29 DIAGNOSIS — Z79899 Other long term (current) drug therapy: Secondary | ICD-10-CM

## 2019-03-29 DIAGNOSIS — J189 Pneumonia, unspecified organism: Secondary | ICD-10-CM

## 2019-03-29 DIAGNOSIS — Z8249 Family history of ischemic heart disease and other diseases of the circulatory system: Secondary | ICD-10-CM

## 2019-03-29 DIAGNOSIS — I5022 Chronic systolic (congestive) heart failure: Secondary | ICD-10-CM

## 2019-03-29 DIAGNOSIS — A419 Sepsis, unspecified organism: Secondary | ICD-10-CM

## 2019-03-29 DIAGNOSIS — I482 Chronic atrial fibrillation, unspecified: Secondary | ICD-10-CM | POA: Diagnosis present

## 2019-03-29 DIAGNOSIS — R64 Cachexia: Secondary | ICD-10-CM | POA: Diagnosis present

## 2019-03-29 DIAGNOSIS — Z885 Allergy status to narcotic agent status: Secondary | ICD-10-CM

## 2019-03-29 DIAGNOSIS — E878 Other disorders of electrolyte and fluid balance, not elsewhere classified: Secondary | ICD-10-CM | POA: Diagnosis present

## 2019-03-29 DIAGNOSIS — Z886 Allergy status to analgesic agent status: Secondary | ICD-10-CM

## 2019-03-29 DIAGNOSIS — J690 Pneumonitis due to inhalation of food and vomit: Secondary | ICD-10-CM | POA: Diagnosis present

## 2019-03-29 DIAGNOSIS — L89152 Pressure ulcer of sacral region, stage 2: Secondary | ICD-10-CM | POA: Diagnosis present

## 2019-03-29 DIAGNOSIS — Z888 Allergy status to other drugs, medicaments and biological substances status: Secondary | ICD-10-CM

## 2019-03-29 DIAGNOSIS — F0390 Unspecified dementia without behavioral disturbance: Secondary | ICD-10-CM | POA: Diagnosis present

## 2019-03-29 DIAGNOSIS — Z515 Encounter for palliative care: Secondary | ICD-10-CM | POA: Diagnosis not present

## 2019-03-29 DIAGNOSIS — Z8051 Family history of malignant neoplasm of kidney: Secondary | ICD-10-CM

## 2019-03-29 DIAGNOSIS — L8990 Pressure ulcer of unspecified site, unspecified stage: Secondary | ICD-10-CM | POA: Insufficient documentation

## 2019-03-29 DIAGNOSIS — Z882 Allergy status to sulfonamides status: Secondary | ICD-10-CM

## 2019-03-29 DIAGNOSIS — Z79891 Long term (current) use of opiate analgesic: Secondary | ICD-10-CM

## 2019-03-29 DIAGNOSIS — I10 Essential (primary) hypertension: Secondary | ICD-10-CM | POA: Diagnosis present

## 2019-03-29 DIAGNOSIS — G9349 Other encephalopathy: Secondary | ICD-10-CM | POA: Diagnosis present

## 2019-03-29 DIAGNOSIS — E860 Dehydration: Secondary | ICD-10-CM | POA: Diagnosis present

## 2019-03-29 LAB — URINALYSIS, ROUTINE W REFLEX MICROSCOPIC
Glucose, UA: NEGATIVE mg/dL
Ketones, ur: NEGATIVE mg/dL
Nitrite: NEGATIVE
Protein, ur: >=300 mg/dL — AB
Specific Gravity, Urine: 1.03 (ref 1.005–1.030)
WBC, UA: >50 WBC/hpf — ABNORMAL HIGH (ref 0–5)
pH: 7 (ref 5.0–8.0)

## 2019-03-29 LAB — TRIGLYCERIDES: Triglycerides: 97 mg/dL (ref <150)

## 2019-03-29 LAB — COMPREHENSIVE METABOLIC PANEL
ALT: 16 U/L (ref 0–44)
AST: 40 U/L (ref 15–41)
Albumin: 2.3 g/dL — ABNORMAL LOW (ref 3.5–5.0)
Alkaline Phosphatase: 128 U/L — ABNORMAL HIGH (ref 38–126)
Anion gap: 16 — ABNORMAL HIGH (ref 5–15)
BUN: 54 mg/dL — ABNORMAL HIGH (ref 8–23)
CO2: 25 mmol/L (ref 22–32)
Calcium: 8.9 mg/dL (ref 8.9–10.3)
Chloride: 99 mmol/L (ref 98–111)
Creatinine, Ser: 2.02 mg/dL — ABNORMAL HIGH (ref 0.44–1.00)
GFR calc Af Amer: 24 mL/min — ABNORMAL LOW (ref >60)
GFR calc non Af Amer: 21 mL/min — ABNORMAL LOW (ref >60)
Glucose, Bld: 117 mg/dL — ABNORMAL HIGH (ref 70–99)
Potassium: 4.4 mmol/L (ref 3.5–5.1)
Sodium: 140 mmol/L (ref 135–145)
Total Bilirubin: 1.1 mg/dL (ref 0.3–1.2)
Total Protein: 7.6 g/dL (ref 6.5–8.1)

## 2019-03-29 LAB — CBC
HCT: 37.1 % (ref 36.0–46.0)
Hemoglobin: 11.1 g/dL — ABNORMAL LOW (ref 12.0–15.0)
MCH: 28.8 pg (ref 26.0–34.0)
MCHC: 29.9 g/dL — ABNORMAL LOW (ref 30.0–36.0)
MCV: 96.4 fL (ref 80.0–100.0)
Platelets: 280 10*3/uL (ref 150–400)
RBC: 3.85 MIL/uL — ABNORMAL LOW (ref 3.87–5.11)
RDW: 18.8 % — ABNORMAL HIGH (ref 11.5–15.5)
WBC: 22.5 10*3/uL — ABNORMAL HIGH (ref 4.0–10.5)
nRBC: 0 % (ref 0.0–0.2)

## 2019-03-29 LAB — POCT I-STAT 7, (LYTES, BLD GAS, ICA,H+H)
Acid-Base Excess: 5 mmol/L — ABNORMAL HIGH (ref 0.0–2.0)
Bicarbonate: 27.4 mmol/L (ref 20.0–28.0)
Calcium, Ion: 1.17 mmol/L (ref 1.15–1.40)
HCT: 32 % — ABNORMAL LOW (ref 36.0–46.0)
Hemoglobin: 10.9 g/dL — ABNORMAL LOW (ref 12.0–15.0)
O2 Saturation: 100 %
Patient temperature: 97.9
Potassium: 3.9 mmol/L (ref 3.5–5.1)
Sodium: 140 mmol/L (ref 135–145)
TCO2: 28 mmol/L (ref 22–32)
pCO2 arterial: 33.3 mmHg (ref 32.0–48.0)
pH, Arterial: 7.522 — ABNORMAL HIGH (ref 7.350–7.450)
pO2, Arterial: 192 mmHg — ABNORMAL HIGH (ref 83.0–108.0)

## 2019-03-29 LAB — PROTIME-INR
INR: 3.1 — ABNORMAL HIGH (ref 0.8–1.2)
Prothrombin Time: 31.6 seconds — ABNORMAL HIGH (ref 11.4–15.2)

## 2019-03-29 LAB — LACTATE DEHYDROGENASE: LDH: 265 U/L — ABNORMAL HIGH (ref 98–192)

## 2019-03-29 LAB — D-DIMER, QUANTITATIVE: D-Dimer, Quant: 1.47 ug/mL-FEU — ABNORMAL HIGH (ref 0.00–0.50)

## 2019-03-29 LAB — ABO/RH: ABO/RH(D): O NEG

## 2019-03-29 LAB — FERRITIN: Ferritin: 461 ng/mL — ABNORMAL HIGH (ref 11–307)

## 2019-03-29 LAB — LACTIC ACID, PLASMA
Lactic Acid, Venous: 2.3 mmol/L (ref 0.5–1.9)
Lactic Acid, Venous: 2.3 mmol/L (ref 0.5–1.9)

## 2019-03-29 LAB — FIBRINOGEN: Fibrinogen: 702 mg/dL — ABNORMAL HIGH (ref 210–475)

## 2019-03-29 LAB — PROCALCITONIN: Procalcitonin: 2.27 ng/mL

## 2019-03-29 LAB — C-REACTIVE PROTEIN: CRP: 36.9 mg/dL — ABNORMAL HIGH (ref <1.0)

## 2019-03-29 LAB — POC SARS CORONAVIRUS 2 AG -  ED: SARS Coronavirus 2 Ag: POSITIVE — AB

## 2019-03-29 MED ORDER — SODIUM CHLORIDE 0.9 % IV SOLN: INTRAVENOUS | Status: DC

## 2019-03-29 MED ORDER — SODIUM CHLORIDE 0.9 % IV SOLN
1.0000 g | INTRAVENOUS | Status: DC
  Administered 2019-03-29: 1 g via INTRAVENOUS
  Filled 2019-03-29 (×2): qty 1

## 2019-03-29 MED ORDER — TRAMADOL HCL 50 MG PO TABS: 50.0000 mg | ORAL_TABLET | Freq: Four times a day (QID) | ORAL | Status: DC | PRN

## 2019-03-29 MED ORDER — SODIUM CHLORIDE 0.9 % IV BOLUS (SEPSIS)
500.0000 mL | Freq: Once | INTRAVENOUS | Status: AC
  Administered 2019-03-29: 500 mL via INTRAVENOUS

## 2019-03-29 MED ORDER — SODIUM CHLORIDE 0.9 % IV BOLUS
500.0000 mL | Freq: Once | INTRAVENOUS | Status: AC
  Administered 2019-03-29: 500 mL via INTRAVENOUS

## 2019-03-29 MED ORDER — WARFARIN - PHARMACIST DOSING INPATIENT: Freq: Every day | Status: DC

## 2019-03-29 MED ORDER — LACTATED RINGERS IV BOLUS: 500.0000 mL | Freq: Once | INTRAVENOUS | Status: DC

## 2019-03-29 MED ORDER — GUAIFENESIN-DM 100-10 MG/5ML PO SYRP: 10.0000 mL | ORAL_SOLUTION | ORAL | Status: DC | PRN

## 2019-03-29 MED ORDER — IPRATROPIUM-ALBUTEROL 20-100 MCG/ACT IN AERS
1.0000 | INHALATION_SPRAY | Freq: Four times a day (QID) | RESPIRATORY_TRACT | Status: DC
  Administered 2019-03-30 – 2019-04-03 (×11): 1 via RESPIRATORY_TRACT
  Filled 2019-03-29 (×2): qty 4

## 2019-03-29 MED ORDER — COLCHICINE 0.6 MG PO TABS
0.6000 mg | ORAL_TABLET | Freq: Every day | ORAL | Status: DC
  Administered 2019-03-30: 0.6 mg via ORAL
  Filled 2019-03-29 (×2): qty 1

## 2019-03-29 MED ORDER — VANCOMYCIN HCL IN DEXTROSE 1-5 GM/200ML-% IV SOLN
1000.0000 mg | Freq: Once | INTRAVENOUS | Status: AC
  Administered 2019-03-29: 1000 mg via INTRAVENOUS
  Filled 2019-03-29: qty 200

## 2019-03-29 MED ORDER — METOPROLOL TARTRATE 5 MG/5ML IV SOLN
2.5000 mg | Freq: Once | INTRAVENOUS | Status: AC
  Administered 2019-03-30: 2.5 mg via INTRAVENOUS
  Filled 2019-03-29: qty 5

## 2019-03-29 MED ORDER — SODIUM CHLORIDE 0.9 % IV SOLN
1.0000 g | Freq: Once | INTRAVENOUS | Status: DC
  Filled 2019-03-29: qty 1

## 2019-03-29 MED ORDER — ACETAMINOPHEN 325 MG PO TABS: 650.0000 mg | ORAL_TABLET | Freq: Four times a day (QID) | ORAL | Status: DC | PRN

## 2019-03-29 MED ORDER — SODIUM CHLORIDE 0.9 % IV SOLN
200.0000 mg | Freq: Once | INTRAVENOUS | Status: AC
  Administered 2019-03-29: 200 mg via INTRAVENOUS
  Filled 2019-03-29: qty 40

## 2019-03-29 MED ORDER — DEXAMETHASONE SODIUM PHOSPHATE 10 MG/ML IJ SOLN
10.0000 mg | Freq: Once | INTRAMUSCULAR | Status: AC
  Administered 2019-03-29: 10 mg via INTRAVENOUS
  Filled 2019-03-29: qty 1

## 2019-03-29 MED ORDER — ONDANSETRON HCL 4 MG PO TABS: 4.0000 mg | ORAL_TABLET | Freq: Four times a day (QID) | ORAL | Status: DC | PRN

## 2019-03-29 MED ORDER — VANCOMYCIN HCL 500 MG/100ML IV SOLN
500.0000 mg | INTRAVENOUS | Status: DC
  Filled 2019-03-29: qty 100

## 2019-03-29 MED ORDER — ONDANSETRON HCL 4 MG/2ML IJ SOLN: 4.0000 mg | Freq: Four times a day (QID) | INTRAMUSCULAR | Status: DC | PRN

## 2019-03-29 MED ORDER — SODIUM CHLORIDE 0.9% FLUSH
3.0000 mL | Freq: Once | INTRAVENOUS | Status: AC
  Administered 2019-03-29: 3 mL via INTRAVENOUS

## 2019-03-29 MED ORDER — LEVOTHYROXINE SODIUM 88 MCG PO TABS
88.0000 ug | ORAL_TABLET | Freq: Every day | ORAL | Status: DC
  Administered 2019-03-30: 88 ug via ORAL
  Filled 2019-03-29 (×7): qty 1

## 2019-03-29 MED ORDER — DEXAMETHASONE SODIUM PHOSPHATE 10 MG/ML IJ SOLN: 6.0000 mg | INTRAMUSCULAR | Status: DC

## 2019-03-29 MED ORDER — ZINC SULFATE 220 (50 ZN) MG PO CAPS
220.0000 mg | ORAL_CAPSULE | Freq: Every day | ORAL | Status: DC
  Administered 2019-03-29 – 2019-03-30 (×2): 220 mg via ORAL
  Filled 2019-03-29 (×2): qty 1

## 2019-03-29 MED ORDER — LORAZEPAM 0.5 MG PO TABS
0.5000 mg | ORAL_TABLET | Freq: Every day | ORAL | Status: DC
  Administered 2019-03-30: 0.5 mg via ORAL
  Filled 2019-03-29: qty 1

## 2019-03-29 MED ORDER — ASCORBIC ACID 500 MG PO TABS
500.0000 mg | ORAL_TABLET | Freq: Every day | ORAL | Status: DC
  Administered 2019-03-30: 500 mg via ORAL
  Filled 2019-03-29: qty 1

## 2019-03-29 MED ORDER — WARFARIN 0.5 MG HALF TABLET
0.5000 mg | ORAL_TABLET | Freq: Once | ORAL | Status: AC
  Administered 2019-03-29: 0.5 mg via ORAL
  Filled 2019-03-29: qty 1

## 2019-03-29 MED ORDER — SODIUM CHLORIDE 0.9 % IV SOLN
100.0000 mg | Freq: Every day | INTRAVENOUS | Status: DC
  Administered 2019-03-30: 100 mg via INTRAVENOUS
  Filled 2019-03-29 (×2): qty 20

## 2019-03-29 MED ORDER — METOPROLOL TARTRATE 25 MG PO TABS
12.5000 mg | ORAL_TABLET | Freq: Two times a day (BID) | ORAL | Status: DC
  Administered 2019-03-30: 12.5 mg via ORAL
  Filled 2019-03-29: qty 1

## 2019-03-29 NOTE — ED Notes (Signed)
Dinner Tray Ordered @ 1854.  

## 2019-03-29 NOTE — ED Notes (Signed)
Notified Jovita Kussmaul ref pt's heart rate Order received via telephone.

## 2019-03-29 NOTE — H&P (Addendum)
History and Physical    Amanda [name] LZJ:[id number] DOB: 08-29-1922 DOA: 04/23/2019  PCP: Barbie Banner, MD   Patient coming from:[facility]  Chief Complaint:Lethargy  HPI: Amanda [name] is a 86 y.o. female with medical history significant for COVID-19 diagnosis on 03/26/2027 at nursing home, chronic systolic CHF with EF 20 to 25%, persistent atrial fibrillation, on Coumadin, dementia, HLD, GERD, hypothyroidism, osteoporosis sent to the ER for increasing lethargy. Patient was diagnosed COVID-19 on 1/18.  Patient has been having increasing lethargy, not being able to tolerate oral over the past few days, having cough.  I reviewed her record she tested positive for COVID-19 on 03/26/19-she had previous COVID-19 testing which was negative prior to that-result is scanned in media section.Patient is a poor historian currently on nonrebreather, history obtained from the chart, ER physician and son.  ED Course:On arrival patient is tachycardic, tachypneic,hypoxic and febrile.  Tele A. fib with RVR heart rate in 160s,temperature 100.4,respiration 22/min saturating 83% on 4 L however pulse ox not resolved by EDP ABG was obtained that showed PO2 in 190s, on nonrebreather and being weaned. CXR Showed "Patchy ill-defined airspace opacity the right lung base suspicious for pneumonia".  1 mL bolus normal saline ordered along with vancomycin cefepime remdesivir and Decadron.  DNR was confirmed.  Patient is being admitted for further management. Patient had just finished after transition bolus heart rate has improved 100-116 afebrile, saturating 92% to 99%-pulse ox not been very reliable.She does not appear to be in any distress.  Review of Systems: Patient is a poor historian Limited review of system denies any chest pain   Past Medical History:  Diagnosis Date  . Anxiety   . Arthritis   . Atrial fibrillation (HCC)   . Atrial fibrillation (HCC)   . Back pain   . Dysrhythmia   . GERD  (gastroesophageal reflux disease)   . HLD (hyperlipidemia)   . Hypertension   . Hypothyroidism   . Insomnia   . Memory impairment   . Osteoarthritis    knee  . Osteoporosis   . PUD (peptic ulcer disease)    hx of upper GI bleeding   . Wears glasses     Past Surgical History:  Procedure Laterality Date  . COLONOSCOPY    . ESOPHAGOGASTRODUODENOSCOPY    . HIP ARTHROPLASTY Right 04/25/2014   Procedure: ARTHROPLASTY BIPOLAR HIP;  Surgeon: Sheral Apley, MD;  Location[facility] OR;  Service: Orthopedics;  Laterality: Right;  . INGUINAL HERNIA REPAIR Right 05/14/2015   Procedure: RIGHT INGUINAL HERNIA REPAIR WITH MESH;  Surgeon: Chevis Pretty III, MD;  Location: [facility] OR;  Service: General;  Laterality: Right;  . INSERTION OF MESH Right 05/14/2015   Procedure: INSERTION OF MESH;  Surgeon: Chevis Pretty III, MD;  Location: [facility] OR;  Service: General;  Laterality: Right;  . tumor removed from right side of face       reports that she has never smoked. She has never used smokeless tobacco. She reports that she does not drink alcohol or use drugs.  Allergies  Allergen Reactions  . Alendronate Other (See Comments)    GI Upset (intolerance) At risk for GI perforation  . Aspirin Diarrhea and Other (See Comments)    Melena, black tarry stools  . Aspirin-Acetaminophen-Caffeine Other (See Comments)    Melena, Dark stool  . Nsaids Other (See Comments)    Patient has ulcer   . Sulfamethoxazole Hives  . Sulfonamide Derivatives Hives  . Fioricet-Codeine [Butalbital-Apap-Caff-Cod] Other (  See Comments)    "Allergic," per MAR  . Cephalexin Rash  . Codeine Nausea And Vomiting    GI upset    Family History  Problem Relation Age of Onset  . Kidney cancer Other        family hx  . Coronary artery disease Other        family hx  . Arthritis Other        family hx     Prior to Admission medications   Medication Sig Start Date End Date Taking? Authorizing Provider  acetaminophen (TYLENOL) 325 MG tablet  Take 650 mg by mouth every 4 (four) hours as needed for mild pain or moderate pain.     [provider]  Amino Acids-Protein Hydrolys (FEEDING SUPPLEMENT, PRO-STAT SUGAR FREE 64,) LIQD Take 30 mLs by mouth 2 (two) times daily.    [provider]  bisacodyl (DULCOLAX) 5 MG EC tablet Take 1 tablet (5 mg total) by mouth daily as needed for moderate constipation. 09/15/16   Dhungel, Nishant, MD  docusate sodium (COLACE) 100 MG capsule Take 100 mg by mouth daily.    [provider]  docusate sodium (COLACE) 250 MG capsule Take 1 capsule (250 mg total) by mouth daily. Patient not taking: Reported on 04/04/2017 07/09/16   Emi Holes, PA-C  fentaNYL (DURAGESIC - DOSED MCG/HR) 25 MCG/HR patch Place 1 patch (25 mcg total) onto the skin every 3 (three) days. Patient not taking: Reported on 04/04/2017 09/16/16   Dhungel, Theda Belfast, MD  furosemide (LASIX) 20 MG tablet Take 1 tablet (20 mg total) by mouth daily. 04/07/17   Wendee Beavers, DO  guaifenesin (ROBITUSSIN) 100 MG/5ML syrup Take 100 mg by mouth every 4 (four) hours as needed for cough.    [provider]  ipratropium-albuterol (DUONEB) 0.5-2.5 (3) MG/3ML SOLN Take 3 mLs by nebulization 3 (three) times daily.    [provider]  levothyroxine (SYNTHROID, LEVOTHROID) 88 MCG tablet Take 88 mcg by mouth daily before breakfast.    [provider]  LORazepam (ATIVAN) 0.5 MG tablet Take 0.5 mg by mouth at bedtime.    [provider]  magnesium hydroxide (MILK OF MAGNESIA) 400 MG/5ML suspension Take 30 mLs by mouth daily as needed for mild constipation.    [provider]  Metoprolol Tartrate 37.5 MG TABS Take 37.5 mg by mouth 2 (two) times daily. 04/07/17   Wendee Beavers, DO  Multiple Vitamin (MULTIVITAMIN) tablet Take 1 tablet by mouth daily.    [provider]  ondansetron (ZOFRAN ODT) 4 MG disintegrating tablet Take 1 tablet (4 mg total) by mouth every 8 (eight) hours as  needed for nausea or vomiting. 09/09/16   Pricilla Loveless, MD  ondansetron [facility]) 4 MG/2ML SOLN injection Inject 4 mg into the muscle every 6 (six) hours as needed for nausea or vomiting.    [provider]  Potassium 99 MG TABS Take 297 mg by mouth daily.    [provider]  QUEtiapine (SEROQUEL) 25 MG tablet Take 25 mg by mouth 2 (two) times daily.     [provider]  senna-docusate (SENNA-PLUS) 8.6-50 MG tablet Take 1 tablet by mouth daily as needed for mild constipation.    [provider]  traMADol (ULTRAM) 50 MG tablet Take 50 mg by mouth every 6 (six) hours as needed for moderate pain.    [provider]  traMADol-acetaminophen (ULTRACET) 37.5-325 MG tablet Take 1 tablet by mouth every 8 (eight) hours  as needed. Patient not taking: Reported on 04/04/2017 09/15/16   Dhungel, Theda Belfast, MD  warfarin (COUMADIN) 1 MG tablet Take 1 tablet (1 mg total) by mouth daily at 6 PM. Patient taking differently: Take 2-4 mg by mouth See admin instructions. 2mg  by mouth once daily M,W,F - 4mg  by mouth once daily on Sun, Tues, Thu, Sat 09/16/16   11-22-1995, MD    Physical Exam: Vitals:   Apr 01, 2019 1500 2019-04-01 1530 04/01/19 1715 04-01-2019 1730  BP: 114/80 98/72 127/90 125/78  Pulse: 95 (!) 105 (!) 105   Resp: (!) 28 (!) 24 16 (!) 21  Temp:   98 F (36.7 C)   TempSrc:   Oral   SpO2: (!) 87% (!) 80% 95%   Weight: 47.2 kg     Height: 4\' 11"  (1.499 m)      General exam: AAO to self, aware that she is in the hospital, cachectic ill and frail-appearing HEENT:Oral mucosa DRY, Ear/Nose WNL grossly, dentition normal. Respiratory system: Right basal lung with crackles, otherwise lungs clear, no wheezing, tachypneic.  Cardiovascular system: S1 & S2 +, No JVD, irregular RR. Gastrointestinal system: Abdomen soft, NT,ND, BS+ Nervous System:Alert, awake, moving extremities and grossly nonfocal Extremities: No edema, distal peripheral pulses palpable.  Skin: No  rashes,no icterus. MSK: thin muscle bulk,tone, power  Labs on Admission: I have personally reviewed following labs and imaging studies  CBC: Recent Labs  Lab Apr 01, 2019 1457 04-01-2019 1656  WBC 22.5*  --   HGB 11.1* 10.9*  HCT 37.1 32.0*  MCV 96.4  --   PLT 280  --    Basic Metabolic Panel: Recent Labs  Lab 04/01/2019 1457 04-01-19 1656  NA 140 140  K 4.4 3.9  CL 99  --   CO2 25  --   GLUCOSE 117*  --   BUN 54*  --   CREATININE 2.02*  --   CALCIUM 8.9  --    GFR: Estimated Creatinine Clearance: 11.6 mL/min (A) (by C-G formula based on SCr of 2.02 mg/dL (H)). Liver Function Tests: Recent Labs  Lab 04/01/19 1457  AST 40  ALT 16  ALKPHOS 128*  BILITOT 1.1  PROT 7.6  ALBUMIN 2.3*   No results for input(s): LIPASE, AMYLASE in the last 168 hours. No results for input(s): AMMONIA in the last 168 hours. Coagulation Profile: Recent Labs  Lab 01-Apr-2019 1642  INR 3.1*   Cardiac Enzymes: No results for input(s): CKTOTAL, CKMB, CKMBINDEX, TROPONINI in the last 168 hours. BNP (last 3 results) No results for input(s): PROBNP in the last 8760 hours. HbA1C: No results for input(s): HGBA1C in the last 72 hours. CBG: No results for input(s): GLUCAP in the last 168 hours. Lipid Profile: Recent Labs    Apr 01, 2019 1642  TRIG 97   Thyroid Function Tests: No results for input(s): TSH, T4TOTAL, FREET4, T3FREE, THYROIDAB in the last 72 hours. Anemia Panel: No results for input(s): VITAMINB12, FOLATE, FERRITIN, TIBC, IRON, RETICCTPCT in the last 72 hours. Urine analysis:    Component Value Date/Time   COLORURINE YELLOW 2019/04/01 1644   APPEARANCEUR TURBID (A) 04/01/2019 1644   LABSPEC 1.030 04/01/2019 1644   PHURINE 7.0 Apr 01, 2019 1644   GLUCOSEU NEGATIVE 2019-04-01 1644   HGBUR SMALL (A) 04/01/19 1644   BILIRUBINUR SMALL (A) 01-Apr-2019 1644   KETONESUR NEGATIVE 04/01/2019 1644   PROTEINUR >=300 (A) 04-01-2019 1644   UROBILINOGEN 1.0 08/02/2009 1531   NITRITE  NEGATIVE 01-Apr-2019 1644   LEUKOCYTESUR MODERATE (A) 04-01-2019 1644  Radiological Exams on Admission: DG Chest Port 1 View  Result Date: 04-14-2019 CLINICAL DATA:  Altered mental status. EXAM: PORTABLE CHEST 1 VIEW COMPARISON:  04/04/2017 FINDINGS: The cardiac silhouette, mediastinal and hilar contours are within normal limits. There is mild tortuosity and calcification of the thoracic aorta. Patchy ill-defined airspace opacity the right lung base suspicious for pneumonia. No definite pleural effusion. The left lung is relatively clear. IMPRESSION: Right lower lobe infiltrate. Electronically Signed   By: Marijo Sanes M.D.   On: 04/14/2019 16:27    Assessment/Plan  Acute hypoxemic respiratory failure due to COVID-19 and possibly bacterial pneumonia: Will admit to [facility] with airborne precatuions. CRP, ferritin, procalcitonin D-dimer pending.Continue on Decadron, remdesivir-pharmacy to dose, monitor inflammatory markers.  Continue supplemental oxygen will try to wean down as PO2 appears high in 192-in ABG, pulse ox not very reliable due to poor perfusion as she appears dry.   Sepsis with lactic acidosis tachycardia fever tachypnea hypoxia due to pneumonia/COVID-19 infection/Right lower lobe pneumonia:We will continue on blood aspirin antibiotics with cefepime vancomycin,patient is elderly nursing home resident. Continue COVID-19 treatment as #1.F/U blood culture. Procalcitonin pending.  Atrial fibrillation, persistent with RVR: Rate improving with hydration.  Blood pressure stable continue IV fluids resume home regimen slowly.  If remains poorly controlled consider Cardizem infusion (caution given her low EF). INR at 3.1- resume Coumadin, pharmacy to dose.  Resume metoprolol 12.5 mg twice daily and uptitrate slowly.  Chronic Systolic CHF with EF 20 to 25%: Currently compensated appears very dry.  Cautiously hydrating.  Monitor BNP. Hold her diuretics  AKI on CKD stage IIIb: Baseline creatinine  around 1.1 January/2019 currently at 2.0, 2/2 volume depletion and sepsis, decreased oral intake in the setting of COVID-19: Patient appears volume depleted status post half liter normal saline.  Will give another half liter normal saline over next 2 hours then start NSS at 50 mL/hr. repeat labs in a.m.  UTI POA: UA grossly abnormal. Cont cefepime as above. F/u culture.  Essential hypertension: Hold antihypertensives.  Hypothyroidism resume Synthroid.  Check TSH  DNR: Patient has arrived with orange DNR form.confirmed with son poa  Nutrition: Allow PO if passes bedside swallow.  History of severe protein calorie malnutrition- consult dietitian to augment nutrition. Pressure Ulcer: Patient has a history of sacral and hip pressure ulcers on previous admission consult wound care.  Body mass index is 21.01 kg/m.   Severity of Illness: I certify that at the point of admission it is my clinical judgment that the patient will require inpatient hospital care spanning beyond 2 midnights from the point of admission due to high intensity of service, high risk for further deterioration and high frequency of surveillance required today acute hypoxic respiratory failure, renal failure, pneumonia, sepsis, COVID-19 infection.   DVT prophylaxis:SCD-Coumadin, pharmacy dosing Code Status:DNR  Family Communication:Admission, patients condition and plan of care including tests being ordered have been discussed with the patient's son [name] who indicate understanding and agree with the plan and Code Status,he is aware that prognosis is guarded complex comorbidity with chronic systolic CHF, advanced age, with pneumonia/respiratory failure/COVID-19 function/renal failure.  Consults called:   Antonieta Pert MD [facility] Pager [phone]  If 7PM-7AM, please contact night-coverage [email or web address]  Apr 14, 2019, 5:59 PM

## 2019-03-29 NOTE — Progress Notes (Signed)
Pharmacy Antibiotic Note  Amanda [name] is a 86 y.o. female admitted on 03/13/2019 with sepsis 2/2 COVID. History significant for COVID-19 diagnosis on 03/26/2027 at nursing home, chronic systolic CHF with EF 20 to 25%, persistent atrial fibrillation, on Coumadin, dementia, HLD, GERD, hypothyroidism, osteoporosis sent to the ER for increasing lethargy. Patient presented tachypneic, hypoxic and febrile. CXR Showed "Patchy ill-defined airspace opacity the right lung base suspicious for pneumonia".  Pharmacy has been consulted for empiric vancomycin and cefepime dosing.  Plan: Cefepime 1 gm IV q24hr Vancomycin 1000 mg IV x1 dose; then start vancomycin 500 mg IV q48hr  - Goal AUC 400-550.  - Expected AUC: 524  - SCr used: 2.02 Monitor renal function and clinical improvement Narrow when possible, follow cultures   Height: 4\' 11"  (149.9 cm) Weight: 104 lb (47.2 kg) IBW/kg (Calculated) : 43.2  Temp (24hrs), Avg:99.2 F (37.3 C), Min:98 F (36.7 C), Max:100.4 F (38 C)  Recent Labs  Lab 04/08/2019 1457  WBC 22.5*  CREATININE 2.02*  LATICACIDVEN 2.3*    Estimated Creatinine Clearance: 11.6 mL/min (A) (by C-G formula based on SCr of 2.02 mg/dL (H)).    Allergies  Allergen Reactions  . Alendronate Other (See Comments)    GI Upset (intolerance) At risk for GI perforation  . Aspirin Diarrhea and Other (See Comments)    Melena, black tarry stools  . Aspirin-Acetaminophen-Caffeine Other (See Comments)    Melena, Dark stool  . Nsaids Other (See Comments)    Patient has ulcer   . Sulfamethoxazole Hives  . Sulfonamide Derivatives Hives  . Fioricet-Codeine [Butalbital-Apap-Caff-Cod] Other (See Comments)    "Allergic," per MAR  . Cephalexin Rash  . Codeine Nausea And Vomiting    GI upset    Antimicrobials this admission: 1/21 Vancomycin >>  1/21 Cefepime >>  1/21 Remdesivir >>   Dose adjustments this admission:  Microbiology results: 1/21 BCx:  1/21 UCx:   1/18 COVID: Positive    2/18, PharmD  PGY1 Acute Care Pharmacy Resident 03/25/2019 6:02 PM

## 2019-03-29 NOTE — ED Notes (Signed)
Pt ate couple bites of jello   St's that's enough

## 2019-03-29 NOTE — ED Triage Notes (Signed)
Patient presents to ed via [facility]  From [facility] tested positive for covid on 1/18 staff reports increased lethargy today, patient is alert however will not follow commands currently in a-fib with RVR

## 2019-03-29 NOTE — ED Notes (Signed)
Son, would like to relay message to RN-Patient already got 1st COVID SHOT 2 WEEKS AGO.

## 2019-03-29 NOTE — Progress Notes (Signed)
Pharmacy Antibiotic Note  Amanda [name] is a 86 y.o. female admitted on April 09, 2019 with sepsis.  Pharmacy has been consulted for vancomycin and cefepime dosing.  Plan: Cefepime 1 gm IV q24hr Vancomycin 1000 mg IV x1 dose; then start vancomycin 500 mg IV q48hr  - Goal AUC 400-550.  - Expected AUC: 524  - SCr used: 2.02   Height: 4\' 11"  (149.9 cm) Weight: 104 lb (47.2 kg) IBW/kg (Calculated) : 43.2  Temp (24hrs), Avg:99.2 F (37.3 C), Min:98 F (36.7 C), Max:100.4 F (38 C)  Recent Labs  Lab Apr 09, 2019 1457  WBC 22.5*  CREATININE 2.02*  LATICACIDVEN 2.3*    Estimated Creatinine Clearance: 11.6 mL/min (A) (by C-G formula based on SCr of 2.02 mg/dL (H)).    Allergies  Allergen Reactions   Alendronate Other (See Comments)    GI Upset (intolerance) At risk for GI perforation   Aspirin Diarrhea and Other (See Comments)    Melena, black tarry stools   Aspirin-Acetaminophen-Caffeine Other (See Comments)    Melena, Dark stool   Nsaids Other (See Comments)    Patient has ulcer    Sulfamethoxazole Hives   Sulfonamide Derivatives Hives   Cephalexin Rash   Codeine Nausea And Vomiting    GI upset   Thank you for allowing pharmacy to be a part of this patient's care.  03/31/19 Apr 09, 2019 5:33 PM

## 2019-03-29 NOTE — ED Provider Notes (Signed)
[facility] Provider Note   CSN: [id number] Arrival date & time: 04/08/2019  1406     History Chief Complaint  Patient presents with  . Altered Mental Status    Amanda [name] is a 86 y.o. female.  HPI 86 year old female with history of atrial fibrillation currently on warfarin, hypertension, dementia, currently living at a skilled nursing facility presenting to the emergency department for increasing lethargy, malaise and fatigue.  Patient also was tested positive for Covid, transferred to this facility for tachycardia as well as her increasing oxygen requirement.  Patient also has history of heart failure with a EF of 20%.  Review of systems limited secondary to patient's acuity as well as her mental status, most of the history obtained from family members.  Patient has not been tolerating p.o. well over the past few days, has been increasingly lethargic, also had a cough.  No fevers at the facility.  On arrival to the ED patient was febrile, hypoxic, tachypneic and tachycardic.    Past Medical History:  Diagnosis Date  . Anxiety   . Arthritis   . Atrial fibrillation (HCC)   . Atrial fibrillation (HCC)   . Back pain   . Dysrhythmia   . GERD (gastroesophageal reflux disease)   . HLD (hyperlipidemia)   . Hypertension   . Hypothyroidism   . Insomnia   . Memory impairment   . Osteoarthritis    knee  . Osteoporosis   . PUD (peptic ulcer disease)    hx of upper GI bleeding   . Wears glasses     Patient Active Problem List   Diagnosis Date Noted  . Acute hypoxemic respiratory failure due to COVID-19 (HCC) 03/31/2019  . Acute respiratory failure with hypoxia (HCC) 03/26/2019  . Protein-calorie malnutrition, severe 04/05/2017  . Acute congestive heart failure (HCC)   . Palliative care by specialist   . DNR (do not resuscitate) discussion   . Weakness generalized   . A-fib (HCC) 04/04/2017  . Rapid atrial fibrillation (HCC) 04/04/2017   . Acute diverticulitis 09/15/2016  . Unspecified open wound, right hip, subsequent encounter 09/15/2016  . Pressure ulcer 09/10/2016  . Thoracic compression fracture, closed, initial encounter (HCC) 09/09/2016  . Right inguinal hernia 05/14/2015  . Atrial fibrillation, persistent (HCC) 02/21/2015  . Femoral neck fracture (HCC) 04/23/2014  . Hip fracture due to osteoporosis (HCC) 04/23/2014  . Long term current use of anticoagulant therapy 10/07/2010  . Essential hypertension 07/21/2009  . CHEST PAIN 07/11/2009  . Hypothyroidism 07/03/2009  . Hyperlipidemia 07/03/2009  . GERD 07/03/2009  . OSTEOARTHRITIS, KNEE 07/03/2009  . Intractable back pain 07/03/2009  . Osteoporosis 07/03/2009    Past Surgical History:  Procedure Laterality Date  . COLONOSCOPY    . ESOPHAGOGASTRODUODENOSCOPY    . HIP ARTHROPLASTY Right 04/25/2014   Procedure: ARTHROPLASTY BIPOLAR HIP;  Surgeon: Sheral Apleyimothy D Murphy, MD;  [facility];  Service: Orthopedics;  Laterality: Right;  . INGUINAL HERNIA REPAIR Right 05/14/2015   Procedure: RIGHT INGUINAL HERNIA REPAIR WITH MESH;  Surgeon: Chevis PrettyPaul Toth III, MD;  Location: [facility] OR;  Service: General;  Laterality: Right;  . INSERTION OF MESH Right 05/14/2015   Procedure: INSERTION OF MESH;  Surgeon: Chevis PrettyPaul Toth III, MD;  Location: [facility] OR;  Service: General;  Laterality: Right;  . tumor removed from right side of face       OB History   No obstetric history on file.     Family History  Problem Relation  Age of Onset  . Kidney cancer Other        family hx  . Coronary artery disease Other        family hx  . Arthritis Other        family hx    Social History   Tobacco Use  . Smoking status: Never Smoker  . Smokeless tobacco: Never Used  . Tobacco comment: tobacco use - no  Substance Use Topics  . Alcohol use: No  . Drug use: No    Home Medications Prior to Admission medications   Medication Sig Start Date End Date Taking? Authorizing Provider  acetaminophen  (TYLENOL) 500 MG tablet Take 500 mg by mouth See admin instructions. Take 500 mg by mouth three times a day and 500 mg every four hours as needed for pain   Yes [provider]  ascorbic acid (VITAMIN C) 500 MG tablet Take 1,000 mg by mouth daily.   Yes [provider]  Cholecalciferol (VITAMIN D-3) 25 MCG (1000 UT) CAPS Take 1,000 Units by mouth daily.   Yes [provider]  colchicine 0.6 MG tablet Take 0.6 mg by mouth daily. 03/19/19  Yes [provider]  docusate sodium (COLACE) 100 MG capsule Take 200 mg by mouth daily.    Yes [provider]  furosemide (LASIX) 20 MG tablet Take 1 tablet (20 mg total) by mouth daily. 04/07/17  Yes Wendee Beavers, DO  guaifenesin (ROBITUSSIN) 100 MG/5ML syrup Take 300 mg by mouth every 4 (four) hours as needed for cough.    Yes [provider]  ivermectin (STROMECTOL) 3 MG TABS tablet Take 9 mg by mouth every other day. 03/27/19 04/07/2019 Yes [provider]  levothyroxine (SYNTHROID, LEVOTHROID) 88 MCG tablet Take 88 mcg by mouth every evening.    Yes [provider]  LORazepam (ATIVAN) 0.5 MG tablet Take 0.5 mg by mouth at bedtime.   Yes [provider]  Melatonin 3 MG TABS Take 6 mg by mouth at bedtime.   Yes [provider]  mirtazapine (REMERON) 15 MG tablet Take 15 mg by mouth at bedtime.   Yes [provider]  Multiple Vitamin (MULTIVITAMIN) tablet Take 1 tablet by mouth daily.   Yes [provider]  ondansetron (ZOFRAN) 4 MG tablet Take 4 mg by mouth every 6 (six) hours as needed for nausea.   Yes [provider]  ondansetron (ZOFRAN) 4 MG/2ML SOLN injection Inject 4 mg into the muscle every 6 (six) hours as needed for nausea ("PRESERVATIVE-FREE" formulation).    Yes [provider]  Potassium 99 MG TABS Take 297 mg by mouth daily.   Yes [provider]  QUEtiapine (SEROQUEL) 25 MG tablet Take 25 mg by mouth 2 (two) times  daily.    Yes [provider]  traMADol (ULTRAM) 50 MG tablet Take 50 mg by mouth every 6 (six) hours as needed (for pain).    Yes [provider]  zinc gluconate 50 MG tablet Take 50 mg by mouth daily.   Yes [provider]  Amino Acids-Protein Hydrolys (FEEDING SUPPLEMENT, PRO-STAT SUGAR FREE 64,) LIQD Take 30 mLs by mouth 2 (two) times daily.    [provider]  bisacodyl (DULCOLAX) 5 MG EC tablet Take 1 tablet (5 mg total) by mouth daily as needed for moderate constipation. Patient not taking: Reported on Apr 07, 2019 09/15/16   Dhungel, Nishant, MD  ipratropium-albuterol (DUONEB) 0.5-2.5 (3) MG/3ML SOLN Take 3 mLs by nebulization 3 (  three) times daily.    [provider]  magnesium hydroxide (MILK OF MAGNESIA) 400 MG/5ML suspension Take 30 mLs by mouth daily as needed for mild constipation.    [provider]  Metoprolol Tartrate 37.5 MG TABS Take 37.5 mg by mouth 2 (two) times daily. Patient not taking: Reported on 03/11/2019 04/07/17   Wendee BeaversMcMullen, David J, DO  senna-docusate (SENNA-PLUS) 8.6-50 MG tablet Take 1 tablet by mouth daily as needed for mild constipation.     [provider]  warfarin (COUMADIN) 1 MG tablet Take 1 tablet (1 mg total) by mouth daily at 6 PM. Patient not taking: Reported on 03/20/2019 09/16/16   Dhungel, Theda BelfastNishant, MD    Allergies    Alendronate, Aspirin, Aspirin-acetaminophen-caffeine, Nsaids, Sulfamethoxazole, Sulfonamide derivatives, Fioricet-codeine [butalbital-apap-caff-cod], Cephalexin, and Codeine  Review of Systems   Review of Systems  Unable to perform ROS: Acuity of condition    Physical Exam Updated Vital Signs BP 108/76   Pulse (!) 105   Temp 97.7 F (36.5 C) (Oral)   Resp 14   Ht 4\' 11"  (1.499 m)   Wt 47.2 kg   SpO2 93%   BMI 21.01 kg/m   Physical Exam Vitals and nursing note reviewed.  Constitutional:      General: She is in acute distress.     Appearance: She is well-developed.  She is ill-appearing.     Comments: Elderly, frail, ill appearing  HENT:     Head: Normocephalic and atraumatic.     Right Ear: External ear normal.     Left Ear: External ear normal.     Mouth/Throat:     Mouth: Mucous membranes are dry.     Pharynx: Oropharynx is clear.  Eyes:     Conjunctiva/sclera: Conjunctivae normal.  Cardiovascular:     Rate and Rhythm: Regular rhythm. Tachycardia present.     Heart sounds: No murmur.  Pulmonary:     Effort: Respiratory distress present.     Breath sounds: Rales present.  Abdominal:     Palpations: Abdomen is soft.     Tenderness: There is no abdominal tenderness. There is no guarding or rebound.     Hernia: No hernia is present.  Musculoskeletal:        General: No swelling or tenderness.     Cervical back: Neck supple.  Skin:    General: Skin is warm and dry.     Capillary Refill: Capillary refill takes less than 2 seconds.  Neurological:     Cranial Nerves: No cranial nerve deficit.     Motor: No weakness.  Psychiatric:        Mood and Affect: Mood normal.        Behavior: Behavior normal.     ED Results / Procedures / Treatments   Labs (all labs ordered are listed, but only abnormal results are displayed) Labs Reviewed  COMPREHENSIVE METABOLIC PANEL - Abnormal; Notable for the following components:      Result Value   Glucose, Bld 117 (*)    BUN 54 (*)    Creatinine, Ser 2.02 (*)    Albumin 2.3 (*)    Alkaline Phosphatase 128 (*)    GFR calc non Af Amer 21 (*)    GFR calc Af Amer 24 (*)    Anion gap 16 (*)    All other components within normal limits  CBC - Abnormal; Notable for the following components:   WBC 22.5 (*)    RBC 3.85 (*)  Hemoglobin 11.1 (*)    MCHC 29.9 (*)    RDW 18.8 (*)    All other components within normal limits  LACTIC ACID, PLASMA - Abnormal; Notable for the following components:   Lactic Acid, Venous 2.3 (*)    All other components within normal limits  LACTIC ACID, PLASMA - Abnormal;  Notable for the following components:   Lactic Acid, Venous 2.3 (*)    All other components within normal limits  URINALYSIS, ROUTINE W REFLEX MICROSCOPIC - Abnormal; Notable for the following components:   APPearance TURBID (*)    Hgb urine dipstick SMALL (*)    Bilirubin Urine SMALL (*)    Protein, ur >=300 (*)    Leukocytes,Ua MODERATE (*)    WBC, UA >50 (*)    Bacteria, UA MANY (*)    All other components within normal limits  PROTIME-INR - Abnormal; Notable for the following components:   Prothrombin Time 31.6 (*)    INR 3.1 (*)    All other components within normal limits  D-DIMER, QUANTITATIVE (NOT AT [facility]) - Abnormal; Notable for the following components:   D-Dimer, Quant 1.47 (*)    All other components within normal limits  LACTATE DEHYDROGENASE - Abnormal; Notable for the following components:   LDH 265 (*)    All other components within normal limits  FERRITIN - Abnormal; Notable for the following components:   Ferritin 461 (*)    All other components within normal limits  FIBRINOGEN - Abnormal; Notable for the following components:   Fibrinogen 702 (*)    All other components within normal limits  C-REACTIVE PROTEIN - Abnormal; Notable for the following components:   CRP 36.9 (*)    All other components within normal limits  POC SARS CORONAVIRUS 2 AG -  ED - Abnormal; Notable for the following components:   SARS Coronavirus 2 Ag POSITIVE (*)    All other components within normal limits  POCT I-STAT 7, (LYTES, BLD GAS, ICA,H+H) - Abnormal; Notable for the following components:   pH, Arterial 7.522 (*)    pO2, Arterial 192.0 (*)    Acid-Base Excess 5.0 (*)    HCT 32.0 (*)    Hemoglobin 10.9 (*)    All other components within normal limits  CULTURE, BLOOD (ROUTINE X 2)  CULTURE, BLOOD (ROUTINE X 2)  URINE CULTURE  PROCALCITONIN  TRIGLYCERIDES  CBC WITH DIFFERENTIAL/PLATELET  COMPREHENSIVE METABOLIC PANEL  C-REACTIVE PROTEIN  D-DIMER, QUANTITATIVE (NOT AT  [facility])  TSH  PROCALCITONIN  PROTIME-INR  CBG MONITORING, ED  I-STAT ARTERIAL BLOOD GAS, ED  ABO/RH    EKG EKG Interpretation  Date/Time:  Thursday 04-16-2019 14:13:58 EST Ventricular Rate:  162 PR Interval:    QRS Duration: 77 QT Interval:  265 QTC Calculation: 435 R Axis:   -11 Text Interpretation: Atrial fibrillation with rapid V-rate Ventricular premature complex Anterior infarct, old Nonspecific T abnormalities, lateral leads Since last tracing rate faster Confirmed by Richardean Canal ([id number]) on 04/16/19 3:34:40 PM   Radiology DG Chest Port 1 View  Result Date: 2019-04-16 CLINICAL DATA:  Altered mental status. EXAM: PORTABLE CHEST 1 VIEW COMPARISON:  04/04/2017 FINDINGS: The cardiac silhouette, mediastinal and hilar contours are within normal limits. There is mild tortuosity and calcification of the thoracic aorta. Patchy ill-defined airspace opacity the right lung base suspicious for pneumonia. No definite pleural effusion. The left lung is relatively clear. IMPRESSION: Right lower lobe infiltrate. Electronically Signed   By: Orlene Plum.D.  On: 04-07-19 16:27    Procedures Procedures (including critical care time)  Medications Ordered in ED Medications  ceFEPIme (MAXIPIME) 1 g in sodium chloride 0.9 % 100 mL IVPB (0 g Intravenous Stopped 07-Apr-2019 1904)  remdesivir 200 mg in sodium chloride 0.9% 250 mL IVPB (0 mg Intravenous Stopped 04/07/2019 1657)    Followed by  remdesivir 100 mg in sodium chloride 0.9 % 100 mL IVPB (has no administration in time range)  vancomycin (VANCOREADY) IVPB 500 mg/100 mL (has no administration in time range)  0.9 %  sodium chloride infusion (has no administration in time range)  Ipratropium-Albuterol (COMBIVENT) respimat 1 puff (has no administration in time range)  dexamethasone (DECADRON) injection 6 mg (has no administration in time range)  guaiFENesin-dextromethorphan (ROBITUSSIN DM) 100-10 MG/5ML syrup 10 mL (has no administration  in time range)  acetaminophen (TYLENOL) tablet 650 mg (has no administration in time range)  ondansetron (ZOFRAN) tablet 4 mg (has no administration in time range)    Or  ondansetron (ZOFRAN) injection 4 mg (has no administration in time range)  ascorbic acid (VITAMIN C) tablet 500 mg (has no administration in time range)  zinc sulfate capsule 220 mg (220 mg Oral Given Apr 07, 2019 2014)  traMADol (ULTRAM) tablet 50 mg (has no administration in time range)  LORazepam (ATIVAN) tablet 0.5 mg (has no administration in time range)  levothyroxine (SYNTHROID) tablet 88 mcg (has no administration in time range)  metoprolol tartrate (LOPRESSOR) tablet 12.5 mg (has no administration in time range)  colchicine tablet 0.6 mg (has no administration in time range)  Warfarin - Pharmacist Dosing Inpatient (has no administration in time range)  sodium chloride flush (NS) 0.9 % injection 3 mL (3 mLs Intravenous Given 04-07-2019 1707)  dexamethasone (DECADRON) injection 10 mg (10 mg Intravenous Given 07-Apr-2019 1718)  sodium chloride 0.9 % bolus 500 mL (0 mLs Intravenous Stopped 2019-04-07 1752)  vancomycin (VANCOCIN) IVPB 1000 mg/200 mL premix (0 mg Intravenous Stopped Apr 07, 2019 1857)  sodium chloride 0.9 % bolus 500 mL (500 mLs Intravenous New Bag/Given 07-Apr-2019 1900)  warfarin (COUMADIN) tablet 0.5 mg (0.5 mg Oral Given 2019/04/07 2016)    ED Course  I have reviewed the triage vital signs and the nursing notes.  Pertinent labs & imaging results that were available during my care of the patient were reviewed by me and considered in my medical decision making (see chart for details).    MDM Rules/Calculators/A&P                       86 year old female with history stated above presenting to the ED for lethargy, found to be febrile, tachycardic in atrial fibrillation, hypoxic and tachypneic.  Patient ill-appearing on initial exam, tachycardic in the 150s, tachypneic as well as febrile.  Unsure patient's true oxygen  saturation, cannot achieve good waveform.  Patient is elderly and frail, spoke with son who is the POA, states that she is a DNR but if need supportive airway device he consents to that.  Patient was alert, oriented x2, mumbling at times, but otherwise protecting airway on initial arrival.  EKG with A. fib with RVR.  Patient was gently rehydrated given her history of heart failure, as well as likely sepsis.  Will avoid blocking her at this time given her poor cardiac output at baseline as well as her need for her tachycardia likely for her cardiovascular support.  We will continue to support patient with submental oxygen as well as gentle fluid rehydration, blood  cultures, basic labs as well as urine studies and chest x-ray were obtained, antibiotics were given prophylactically given her fever and current presenting illness.  Decadron redemption were also given.  Patient need to be admitted to the stepdown unit, higher level of care given her presenting illness.  ABG obtained as well.  Patient's chest x-ray consistent with pneumonia.  Likely sepsis driving her tachycardia.  After small amount of fluid as well as Tylenol, patient's tachycardia resolved down into the low 120s to 1 teens.  Lactic acid mildly up as well, patient be admitted to the hospital, and antibiotics given as well.  Patient continued to be satting well on nonrebreather, no need for intubation at this time.  AKI on labs as well.  Admitted in stable yet tenuous condition.  The attending physician was present and available for all medical decision making and procedures related to this patient's care.      Final Clinical Impression(s) / ED Diagnoses Final diagnoses:  HCAP (healthcare-associated pneumonia)  Sepsis, due to unspecified organism, unspecified whether acute organ dysfunction present [facility])    Rx / DC Orders ED Discharge Orders    None       Jamey Reas, MD 04-25-19 2121    Charlynne Pander, MD 03/30/19 1556

## 2019-03-29 NOTE — ED Notes (Signed)
Patient was incont of stool cleaned patient and patient has skin breakdown on buttocks large pad applied.

## 2019-03-29 NOTE — Progress Notes (Signed)
ANTICOAGULATION CONSULT NOTE - Initial Consult  Pharmacy Consult for warfarin Indication: atrial fibrillation   Patient Measurements: Height: 4\' 11"  (149.9 cm) Weight: 104 lb (47.2 kg) IBW/kg (Calculated) : 43.2   Vital Signs: Temp: 98 F (36.7 C) (01/21 1715) Temp Source: Oral (01/21 1715) BP: 125/78 (01/21 1730) Pulse Rate: 105 (01/21 1715)  Labs: Recent Labs    03/20/2019 1457 04/05/2019 1642 03/28/2019 1656  HGB 11.1*  --  10.9*  HCT 37.1  --  32.0*  PLT 280  --   --   LABPROT  --  31.6*  --   INR  --  3.1*  --   CREATININE 2.02*  --   --      Medical History: Past Medical History:  Diagnosis Date  . Anxiety   . Arthritis   . Atrial fibrillation (HCC)   . Atrial fibrillation (HCC)   . Back pain   . Dysrhythmia   . GERD (gastroesophageal reflux disease)   . HLD (hyperlipidemia)   . Hypertension   . Hypothyroidism   . Insomnia   . Memory impairment   . Osteoarthritis    knee  . Osteoporosis   . PUD (peptic ulcer disease)    hx of upper GI bleeding   . Wears glasses      Assessment: 86 year old female admitted with lethargy. She is on warfarin prior to admission for AFib. Her INR on admission is 3.1. She was on a dose of 1 mg po daily.  Goal of Therapy:  INR 2-3 Monitor platelets by anticoagulation protocol: Yes   Plan:  -Administer warfarin 0.5 mg po x1 tonight -Daily INR   97 03/24/2019,6:45 PM

## 2019-03-29 NOTE — ED Notes (Signed)
PureWick placed. Sacral dressing placed.

## 2019-03-29 NOTE — ED Notes (Signed)
Tommy (Son#[phone]) called/would like to visit patient once in a room.

## 2019-03-30 LAB — COMPREHENSIVE METABOLIC PANEL
ALT: 17 U/L (ref 0–44)
AST: 38 U/L (ref 15–41)
Albumin: 2.4 g/dL — ABNORMAL LOW (ref 3.5–5.0)
Alkaline Phosphatase: 129 U/L — ABNORMAL HIGH (ref 38–126)
Anion gap: 14 (ref 5–15)
BUN: 55 mg/dL — ABNORMAL HIGH (ref 8–23)
CO2: 24 mmol/L (ref 22–32)
Calcium: 8.4 mg/dL — ABNORMAL LOW (ref 8.9–10.3)
Chloride: 105 mmol/L (ref 98–111)
Creatinine, Ser: 1.63 mg/dL — ABNORMAL HIGH (ref 0.44–1.00)
GFR calc Af Amer: 31 mL/min — ABNORMAL LOW (ref >60)
GFR calc non Af Amer: 27 mL/min — ABNORMAL LOW (ref >60)
Glucose, Bld: 122 mg/dL — ABNORMAL HIGH (ref 70–99)
Potassium: 4.2 mmol/L (ref 3.5–5.1)
Sodium: 143 mmol/L (ref 135–145)
Total Bilirubin: 1 mg/dL (ref 0.3–1.2)
Total Protein: 7.4 g/dL (ref 6.5–8.1)

## 2019-03-30 LAB — BRAIN NATRIURETIC PEPTIDE: B Natriuretic Peptide: 485.5 pg/mL — ABNORMAL HIGH (ref 0.0–100.0)

## 2019-03-30 LAB — CBC WITH DIFFERENTIAL/PLATELET
Abs Immature Granulocytes: 0.19 10*3/uL — ABNORMAL HIGH (ref 0.00–0.07)
Basophils Absolute: 0.1 10*3/uL (ref 0.0–0.1)
Basophils Relative: 0 %
Eosinophils Absolute: 0.1 10*3/uL (ref 0.0–0.5)
Eosinophils Relative: 1 %
HCT: 35.6 % — ABNORMAL LOW (ref 36.0–46.0)
Hemoglobin: 10.5 g/dL — ABNORMAL LOW (ref 12.0–15.0)
Immature Granulocytes: 1 %
Lymphocytes Relative: 5 %
Lymphs Abs: 0.9 10*3/uL (ref 0.7–4.0)
MCH: 28.1 pg (ref 26.0–34.0)
MCHC: 29.5 g/dL — ABNORMAL LOW (ref 30.0–36.0)
MCV: 95.2 fL (ref 80.0–100.0)
Monocytes Absolute: 0.6 10*3/uL (ref 0.1–1.0)
Monocytes Relative: 3 %
Neutro Abs: 18.6 10*3/uL — ABNORMAL HIGH (ref 1.7–7.7)
Neutrophils Relative %: 90 %
Platelets: 263 10*3/uL (ref 150–400)
RBC: 3.74 MIL/uL — ABNORMAL LOW (ref 3.87–5.11)
RDW: 19.2 % — ABNORMAL HIGH (ref 11.5–15.5)
WBC: 20.5 10*3/uL — ABNORMAL HIGH (ref 4.0–10.5)
nRBC: 0 % (ref 0.0–0.2)

## 2019-03-30 LAB — PROCALCITONIN: Procalcitonin: 1.76 ng/mL

## 2019-03-30 LAB — C-REACTIVE PROTEIN: CRP: 36.7 mg/dL — ABNORMAL HIGH (ref <1.0)

## 2019-03-30 LAB — D-DIMER, QUANTITATIVE: D-Dimer, Quant: 1.75 ug/mL-FEU — ABNORMAL HIGH (ref 0.00–0.50)

## 2019-03-30 LAB — PROTIME-INR
INR: 2.8 — ABNORMAL HIGH (ref 0.8–1.2)
Prothrombin Time: 29.3 s — ABNORMAL HIGH (ref 11.4–15.2)

## 2019-03-30 LAB — TSH: TSH: 1.059 u[IU]/mL (ref 0.350–4.500)

## 2019-03-30 MED ORDER — DEXAMETHASONE SODIUM PHOSPHATE 4 MG/ML IJ SOLN
4.0000 mg | INTRAMUSCULAR | Status: DC
  Administered 2019-03-30: 4 mg via INTRAVENOUS
  Filled 2019-03-30 (×2): qty 1

## 2019-03-30 MED ORDER — WARFARIN SODIUM 1 MG PO TABS
1.0000 mg | ORAL_TABLET | Freq: Once | ORAL | Status: DC
  Filled 2019-03-30: qty 1

## 2019-03-30 MED ORDER — HALOPERIDOL LACTATE 5 MG/ML IJ SOLN
1.0000 mg | Freq: Four times a day (QID) | INTRAMUSCULAR | Status: DC | PRN
  Administered 2019-03-30: 1 mg via INTRAVENOUS
  Filled 2019-03-30: qty 1

## 2019-03-30 MED ORDER — DOCUSATE SODIUM 100 MG PO CAPS: 200.0000 mg | ORAL_CAPSULE | Freq: Two times a day (BID) | ORAL | Status: DC

## 2019-03-30 MED ORDER — LACTATED RINGERS IV SOLN: INTRAVENOUS | Status: DC

## 2019-03-30 MED ORDER — METRONIDAZOLE 500 MG PO TABS
500.0000 mg | ORAL_TABLET | Freq: Three times a day (TID) | ORAL | Status: DC
  Filled 2019-03-30 (×4): qty 1

## 2019-03-30 MED ORDER — RESOURCE THICKENUP CLEAR PO POWD
ORAL | Status: DC | PRN
  Filled 2019-03-30: qty 125

## 2019-03-30 MED ORDER — SODIUM CHLORIDE 0.9 % IV SOLN
2.0000 g | INTRAVENOUS | Status: DC
  Administered 2019-03-30: 2 g via INTRAVENOUS
  Filled 2019-03-30 (×2): qty 20

## 2019-03-30 MED ORDER — LORAZEPAM 2 MG/ML IJ SOLN
0.5000 mg | Freq: Once | INTRAMUSCULAR | Status: AC | PRN
  Administered 2019-03-30: 0.5 mg via INTRAVENOUS
  Filled 2019-03-30: qty 1

## 2019-03-30 MED ORDER — DIGOXIN 0.25 MG/ML IJ SOLN
0.1250 mg | Freq: Four times a day (QID) | INTRAMUSCULAR | Status: DC
  Administered 2019-03-30: 0.125 mg via INTRAVENOUS
  Filled 2019-03-30 (×2): qty 0.5

## 2019-03-30 MED ORDER — ENSURE ENLIVE PO LIQD: 237.0000 mL | Freq: Three times a day (TID) | ORAL | Status: DC

## 2019-03-30 MED ORDER — METOPROLOL TARTRATE 50 MG PO TABS
50.0000 mg | ORAL_TABLET | Freq: Two times a day (BID) | ORAL | Status: DC
  Administered 2019-03-30: 50 mg via ORAL
  Filled 2019-03-30: qty 1

## 2019-03-30 MED ORDER — MORPHINE SULFATE (PF) 2 MG/ML IV SOLN
1.0000 mg | Freq: Once | INTRAVENOUS | Status: AC
  Administered 2019-03-30: 1 mg via INTRAVENOUS
  Filled 2019-03-30: qty 1

## 2019-03-30 MED ORDER — ZINC OXIDE 40 % EX OINT
TOPICAL_OINTMENT | Freq: Two times a day (BID) | CUTANEOUS | Status: DC
  Filled 2019-03-30 (×2): qty 57

## 2019-03-30 NOTE — Progress Notes (Signed)
Labs called with culture results>>1/3 bottles with [facility]. Pt is currently on cefepime/vanc/flagyl to cover for possible PNA/UTI. She doesn't have a hx of resistance in Epic. D/w Dr. Thedore Mins and we will change cefepime to ceftriaxone and continue the other abx for now. MRSA PCR pending.  Ulyses Southward, PharmD, BCIDP, AAHIVP, CPP Infectious Disease Pharmacist 03/30/2019 12:33 PM

## 2019-03-30 NOTE — Progress Notes (Signed)
Pt transferred to floor via stretcher by EMS. Pt is on 15L NRB. Placed pt on monitor. Dr. Dwana Curd made aware that pt has arrived. Asked to clarify metoprolol doses. Bed low to ground, Bed alarm on. Call bell in reach. Instructed pt to call out for assistance when need. Pt NAD at this time./ Will continue to monitor.

## 2019-03-30 NOTE — Progress Notes (Signed)
Notified Dr. Thedore Mins of HR 184 after giving meds orally. Pt gagged and coughed after taking meds crushed in applesauce. HR sustained above 160. MD gave orders, see MAR. CN notified. MD d/c tele/o2 monitoring. Comfort measures initiated.

## 2019-03-30 NOTE — Plan of Care (Signed)
  Problem: Education: Goal: Knowledge of risk factors and measures for prevention of condition will improve Outcome: Progressing   Problem: Coping: Goal: Psychosocial and spiritual needs will be supported Outcome: Progressing   Problem: Respiratory: Goal: Will maintain a patent airway Outcome: Progressing Goal: Complications related to the disease process, condition or treatment will be avoided or minimized Outcome: Progressing   

## 2019-03-30 NOTE — Consult Note (Signed)
WOC Nurse Consult Note:  Patient is receiving care in [facility] 9114.  Patient is COVID +.  I have placed the following order in the record:  Using the facility provided iPhone or tablet and the ROVER app, obtain photos of the wounds the WOC team is being consulted on, and place in the patient record. Once the photo(s) are available, the consult can be completed. At 9:03 I texted the ROVER app instruction sheet to Lindaann Pascal, RN, at [phone]. Images now available for review (12:26) Reason for Consult: "wound eval" Wound type: healed areas of hypopigmentation to the buttocks, surrounded by hyperpigmentation. No actual wound noted in photos.  External female urinary collection system (PureWick) in use. Pressure Injury POA: Yes/No/NA Measurement: Wound bed: Drainage (amount, consistency, odor)  Periwound: Dressing procedure/placement/frequency: As a prophylactic measures, I will order twice daily application of 40% Desitin. Thank you for the consult.  WOC nurse will not follow at this time.  Please re-consult the WOC team if needed.  Helmut Muster, RN, MSN, CWOCN, CNS-BC, pager [phone]

## 2019-03-30 NOTE — TOC Initial Note (Signed)
Transition of Care [facility]) - Initial/Assessment Note    Patient Details  Name: Amanda [name] MRN: [id number] Date of Birth: 1922/08/23  Transition of Care [facility]) CM/SW Contact:    Golda Acre, RN Phone Number: 03/30/2019, 10:38 AM  Clinical Narrative:                 Patient is from [facility] in [place], fl2 faxed to [facility] for review.  Expected Discharge Plan: Skilled Nursing Facility Barriers to Discharge: Continued Medical Work up   Patient Goals and CMS Choice Patient states their goals for this hospitalization and ongoing recovery are:: not stated   Choice offered to / list presented to : Patient  Expected Discharge Plan and Services Expected Discharge Plan: Skilled Nursing Facility   Discharge Planning Services: CM Consult Post Acute Care Choice: Skilled Nursing Facility Living arrangements for the past 2 months: Skilled Nursing Facility                                      Prior Living Arrangements/Services Living arrangements for the past 2 months: Skilled Nursing Facility Lives with:: Facility Resident Patient language and need for interpreter reviewed:: No Do you feel safe going back to the place where you live?: Yes      Need for Family Participation in Patient Care: Yes (Comment) Care giver support system in place?: Yes (comment)   Criminal Activity/Legal Involvement Pertinent to Current Situation/Hospitalization: No - Comment as needed  Activities of Daily Living   ADL Screening (condition at time of admission) Patient's cognitive ability adequate to safely complete daily activities?: No Is the patient deaf or have difficulty hearing?: Yes Does the patient have difficulty seeing, even when wearing glasses/contacts?: No Does the patient have difficulty concentrating, remembering, or making decisions?: Yes Patient able to express need for assistance with ADLs?: Yes Does the patient have difficulty dressing or bathing?:  Yes Independently performs ADLs?: No Communication: Appropriate for developmental age Does the patient have difficulty walking or climbing stairs?: Yes Weakness of Legs: Both  Permission Sought/Granted                  Emotional Assessment Appearance:: Appears stated age     Orientation: : Oriented to Self, Oriented to Place, Oriented to  Time, Oriented to Situation Alcohol / Substance Use: Not Applicable Psych Involvement: No (comment)  Admission diagnosis:  Acute respiratory failure with hypoxia (HCC) [J96.01] HCAP (healthcare-associated pneumonia) [J18.9] Sepsis, due to unspecified organism, unspecified whether acute organ dysfunction present [facility]) [A41.9] Patient Active Problem List   Diagnosis Date Noted  . Acute hypoxemic respiratory failure due to COVID-19 (HCC) 04-Apr-2019  . Acute respiratory failure with hypoxia (HCC) 04-Apr-2019  . Protein-calorie malnutrition, severe 04/05/2017  . Acute congestive heart failure (HCC)   . Palliative care by specialist   . DNR (do not resuscitate) discussion   . Weakness generalized   . A-fib (HCC) 04/04/2017  . Rapid atrial fibrillation (HCC) 04/04/2017  . Acute diverticulitis 09/15/2016  . Unspecified open wound, right hip, subsequent encounter 09/15/2016  . Pressure ulcer 09/10/2016  . Thoracic compression fracture, closed, initial encounter (HCC) 09/09/2016  . Right inguinal hernia 05/14/2015  . Atrial fibrillation, persistent (HCC) 02/21/2015  . Femoral neck fracture (HCC) 04/23/2014  . Hip fracture due to osteoporosis (HCC) 04/23/2014  . Long term current use of anticoagulant therapy 10/07/2010  . Essential hypertension 07/21/2009  . CHEST  PAIN 07/11/2009  . Hypothyroidism 07/03/2009  . Hyperlipidemia 07/03/2009  . GERD 07/03/2009  . OSTEOARTHRITIS, KNEE 07/03/2009  . Intractable back pain 07/03/2009  . Osteoporosis 07/03/2009   PCP:  Christain [place], MD Pharmacy:  No Pharmacies Listed    Social Determinants of  Health (SDOH) Interventions    Readmission Risk Interventions No flowsheet data found.

## 2019-03-30 NOTE — Progress Notes (Signed)
Spoke with son Orvilla Fus, apologized for late update, he had called a few times today and was unable to speak with him. Son updated on pt condition. Facetime scheduled with night nurse.

## 2019-03-30 NOTE — Evaluation (Signed)
Clinical/Bedside Swallow Evaluation Patient Details  Name: Amanda [name] MRN: [id number] Date of Birth: 02-07-1923  Today's Date: 03/30/2019 Time: SLP Start Time (ACUTE ONLY): 1004 SLP Stop Time (ACUTE ONLY): 1025 SLP Time Calculation (min) (ACUTE ONLY): 21 min  Past Medical History:  Past Medical History:  Diagnosis Date  . Anxiety   . Arthritis   . Atrial fibrillation ([place])   . Atrial fibrillation ([place])   . Back pain   . Dysrhythmia   . GERD (gastroesophageal reflux disease)   . HLD (hyperlipidemia)   . Hypertension   . Hypothyroidism   . Insomnia   . Memory impairment   . Osteoarthritis    knee  . Osteoporosis   . PUD (peptic ulcer disease)    hx of upper GI bleeding   . Wears glasses    Past Surgical History:  Past Surgical History:  Procedure Laterality Date  . COLONOSCOPY    . ESOPHAGOGASTRODUODENOSCOPY    . HIP ARTHROPLASTY Right 04/25/2014   Procedure: ARTHROPLASTY BIPOLAR HIP;  Surgeon: Renette Butters, MD;  Location: [place];  Service: Orthopedics;  Laterality: Right;  . INGUINAL HERNIA REPAIR Right 05/14/2015   Procedure: RIGHT INGUINAL HERNIA REPAIR WITH MESH;  Surgeon: Autumn Messing III, MD;  Location: [place];  Service: General;  Laterality: Right;  . INSERTION OF MESH Right 05/14/2015   Procedure: INSERTION OF MESH;  Surgeon: Autumn Messing III, MD;  Location: [place];  Service: General;  Laterality: Right;  . tumor removed from right side of face     HPI:  ALESHKA [name] is a 86 y.o. female with medical history significant for COVID-19 diagnosis on 03/26/2027, GERD, chronic systolic CHF, persistent atrial fibrillation, , dementia, HLD, hypothyroidism, osteoporosis sent to the ER for increasing lethargy. CXR Right lower lobe infiltrate.   Assessment / Plan / Recommendation Clinical Impression  Pt confused, continuously verbalizing and evidence of odonophagia and oropharyngeal dysphagia with suspected intermittentt aspiration. Mild pharyngeal/palatal candidia and facial  grimace with most swallows. Coughing after thin (immediate) and honey thick (possible due to larger sip and/or suspected pharyngeal residue). Nectar thick may mitiagate chances of aspiration and puree but will not prevent. Difficulty using straw with nectar. Will order diet. RN reported likely being made comfort care. If pt is on comfort care and requests thin liquids-let her have it using precautions. Will sign off.     SLP Visit Diagnosis: Dysphagia, unspecified (R13.10)    Aspiration Risk  Moderate aspiration risk    Diet Recommendation Nectar-thick liquid;Dysphagia 1 (Puree)   Liquid Administration via: Cup Medication Administration: Crushed with puree Supervision: Staff to assist with self feeding;Full supervision/cueing for compensatory strategies Compensations: Slow rate;Small sips/bites;Lingual sweep for clearance of pocketing Postural Changes: Seated upright at 90 degrees    Other  Recommendations Oral Care Recommendations: Oral care BID Other Recommendations: Order thickener from pharmacy   Follow up Recommendations Skilled Nursing facility      Frequency and Duration min 1 x/week  2 weeks       Prognosis Prognosis for Safe Diet Advancement: Fair Barriers to Reach Goals: Cognitive deficits      Swallow Study   General HPI: Amanda [name] is a 86 y.o. female with medical history significant for COVID-19 diagnosis on 03/26/2027, GERD, chronic systolic CHF, persistent atrial fibrillation, , dementia, HLD, hypothyroidism, osteoporosis sent to the ER for increasing lethargy. CXR Right lower lobe infiltrate. Type of Study: Bedside Swallow Evaluation Previous Swallow Assessment: (none) Diet Prior to this Study: Thin liquids;Other (  Comment)(full) Temperature Spikes Noted: No Respiratory Status: Room air History of Recent Intubation: No Behavior/Cognition: Alert;Requires cueing;Cooperative Oral Cavity Assessment: Dry Oral Care Completed by SLP: Yes Oral Cavity - Dentition:  Poor condition;Missing dentition Self-Feeding Abilities: Needs assist Patient Positioning: Upright in bed Baseline Vocal Quality: Low vocal intensity Volitional Cough: Cognitively unable to elicit Volitional Swallow: Unable to elicit    Oral/Motor/Sensory Function Overall Oral Motor/Sensory Function: Generalized oral weakness   Ice Chips Ice chips: Not tested   Thin Liquid Thin Liquid: Impaired Presentation: Cup;Straw Oral Phase Impairments: Reduced labial seal;Reduced lingual movement/coordination Oral Phase Functional Implications: Right anterior spillage Pharyngeal  Phase Impairments: Cough - Immediate    Nectar Thick Nectar Thick Liquid: Impaired Presentation: Cup Oral Phase Impairments: Reduced lingual movement/coordination;Reduced labial seal Pharyngeal Phase Impairments: Throat Clearing - Delayed   Honey Thick Honey Thick Liquid: Impaired Presentation: Cup Oral Phase Impairments: Reduced labial seal Pharyngeal Phase Impairments: Cough - Immediate   Puree Puree: Within functional limits   Solid     Solid: Not tested      Royce Macadamia 03/30/2019,10:43 AM  [phone]

## 2019-03-30 NOTE — Progress Notes (Signed)
MD returned call regarding Metoprolol clarification. MD states to give IV now and monitor HR and BP for 20 mins. Send MD vitals.   0100 MD states ok to give PO metoprolol. Considering VSS. Pt NAD at this time. Will continue to monitor.

## 2019-03-30 NOTE — NC FL2 (Signed)
[place] MEDICAID [id number] LEVEL OF CARE SCREENING TOOL     IDENTIFICATION  Patient Name: Amanda [name] Birthdate: 26-Jun-1922 Sex: female Admission Date (Current Location): 31-Mar-2019  [facility] and [place] Number:  [place] and Address:  [facility],  [address] [place] [address]      Provider Number: [id number]  Attending Physician Name and Address:  Leroy Sea, MD  Relative Name and Phone Number:       Current Level of Care: Hospital Recommended Level of Care: Skilled Nursing Facility Prior Approval Number:    Date Approved/Denied:   PASRR Number: [id number]  Discharge Plan: SNF    Current Diagnoses: Patient Active Problem List   Diagnosis Date Noted  . Acute hypoxemic respiratory failure due to COVID-19 (HCC) March 31, 2019  . Acute respiratory failure with hypoxia (HCC) March 31, 2019  . Protein-calorie malnutrition, severe 04/05/2017  . Acute congestive heart failure (HCC)   . Palliative care by specialist   . DNR (do not resuscitate) discussion   . Weakness generalized   . A-fib (HCC) 04/04/2017  . Rapid atrial fibrillation (HCC) 04/04/2017  . Acute diverticulitis 09/15/2016  . Unspecified open wound, right hip, subsequent encounter 09/15/2016  . Pressure ulcer 09/10/2016  . Thoracic compression fracture, closed, initial encounter (HCC) 09/09/2016  . Right inguinal hernia 05/14/2015  . Atrial fibrillation, persistent (HCC) 02/21/2015  . Femoral neck fracture (HCC) 04/23/2014  . Hip fracture due to osteoporosis (HCC) 04/23/2014  . Long term current use of anticoagulant therapy 10/07/2010  . Essential hypertension 07/21/2009  . CHEST PAIN 07/11/2009  . Hypothyroidism 07/03/2009  . Hyperlipidemia 07/03/2009  . GERD 07/03/2009  . OSTEOARTHRITIS, KNEE 07/03/2009  . Intractable back pain 07/03/2009  . Osteoporosis 07/03/2009    Orientation RESPIRATION BLADDER Height & Weight     Self, Time, Situation, Place  O2 Continent Weight:  47.2 kg Height:  4\' 11"  (149.9 cm)  BEHAVIORAL SYMPTOMS/MOOD NEUROLOGICAL BOWEL NUTRITION STATUS      Continent Diet(regular)  AMBULATORY STATUS COMMUNICATION OF NEEDS Skin   Extensive Assist Verbally Normal                       Personal Care Assistance Level of Assistance  Bathing, Feeding, Dressing Bathing Assistance: Limited assistance Feeding assistance: Limited assistance Dressing Assistance: Limited assistance     Functional Limitations Info  Sight, Hearing, Speech Sight Info: Adequate Hearing Info: Adequate Speech Info: Adequate    SPECIAL CARE FACTORS FREQUENCY  PT (By licensed PT)     PT Frequency: 5 x weekly              Contractures Contractures Info: Not present    Additional Factors Info  Code Status Code Status Info: DNR             Current Medications (03/30/2019):  This is the current hospital active medication list Current Facility-Administered Medications  Medication Dose Route Frequency Provider Last Rate Last Admin  . acetaminophen (TYLENOL) tablet 650 mg  650 mg Oral Q6H PRN Kc, Ramesh, MD      . ascorbic acid (VITAMIN C) tablet 500 mg  500 mg Oral Daily Kc, Ramesh, MD      . ceFEPIme (MAXIPIME) 1 g in sodium chloride 0.9 % 100 mL IVPB  1 g Intravenous Q24H 04/01/2019, [facility]   Stopped at 03-31-19 1856  . colchicine tablet 0.6 mg  0.6 mg Oral Daily Kc, Ramesh, MD      . dexamethasone (DECADRON)  injection 6 mg  6 mg Intravenous Q24H Kc, Ramesh, MD      . guaiFENesin-dextromethorphan (ROBITUSSIN DM) 100-10 MG/5ML syrup 10 mL  10 mL Oral Q4H PRN Kc, Ramesh, MD      . Ipratropium-Albuterol (COMBIVENT) respimat 1 puff  1 puff Inhalation Q6H Antonieta Pert, MD   1 puff at 03/30/19 0828  . levothyroxine (SYNTHROID) tablet 88 mcg  88 mcg Oral Q0600 Antonieta Pert, MD   88 mcg at 03/30/19 0618  . LORazepam (ATIVAN) tablet 0.5 mg  0.5 mg Oral QHS Kc, Ramesh, MD   0.5 mg at 03/30/19 0053  . metoprolol tartrate (LOPRESSOR) tablet 12.5 mg  12.5 mg Oral  BID Kc, Ramesh, MD   12.5 mg at 03/30/19 0117  . ondansetron (ZOFRAN) tablet 4 mg  4 mg Oral Q6H PRN Kc, Ramesh, MD       Or  . ondansetron (ZOFRAN) injection 4 mg  4 mg Intravenous Q6H PRN Kc, Ramesh, MD      . remdesivir 100 mg in sodium chloride 0.9 % 100 mL IVPB  100 mg Intravenous Daily Gillian Scarce, RPH      . Resource ThickenUp Clear   Oral PRN Thurnell Lose, MD      . traMADol Veatrice Bourbon) tablet 50 mg  50 mg Oral Q6H PRN Antonieta Pert, MD      . Derrill Memo ON 03/31/2019] vancomycin (VANCOREADY) IVPB 500 mg/100 mL  500 mg Intravenous Q48H Gillian Scarce, RPH      . Warfarin - Pharmacist Dosing Inpatient   Does not apply [address] [place], [facility]      . zinc sulfate capsule 220 mg  220 mg Oral Daily Kc, Ramesh, MD   220 mg at 04/27/2019 2014     Discharge Medications: Please see discharge summary for a list of discharge medications.  Relevant Imaging Results:  Relevant Lab Results:   Additional Information SSN: [id number]  Leeroy Cha, RN

## 2019-03-30 NOTE — Progress Notes (Signed)
 Initial Nutrition Assessment  DOCUMENTATION CODES:   Not applicable  INTERVENTION:   Ensure Enlive po TID, each supplement provides 350 kcal and 20 grams of protein. RN to thicken to desired thickness based on diet order (thin vs nectar vs honey)   Pt receiving Hormel Shake daily with Breakfast which provides 520 kcals and 22 g of protein and Magic cup BID with lunch and dinner, each supplement provides 290 kcal and 9 grams of protein, automatically on meal trays to optimize nutritional intake.     NUTRITION DIAGNOSIS:   Inadequate oral intake related to catabolic illness, acute illness, decreased appetite, lethargy/confusion as evidenced by per patient/family report, meal completion < 25%.  GOAL:   Patient will meet greater than or equal to 90% of their needs  MONITOR:   PO intake, Supplement acceptance, Labs, Weight trends, Skin  REASON FOR ASSESSMENT:   Consult Assessment of nutrition requirement/status  ASSESSMENT:   86 yo female admitted with AMS on 1/21 with recent diagnosis of COVID-19 on 1/18 at nursing home and admitted with possible UTI, aspiration pneumonia, sepsis, dehydration and AKI. Marland Kitchen PMH includes CHF, dementia, HLD, GERD   Evaluated by SLP and currently on Dysphagia I, Nectar Thick diet  No recorded po intake. Noted pt ate only a few bites of Jello last night per RN and then stated that was enough  Unable to obtain diet and weight history at this time.   Based on weight encounters, no recent weight loss  Labs: Creatinine 1.63, BUN 55 Meds: Vit C, decadron, LR at 50 ml/hr, zinc sulfate  Diet Order:   Diet Order            DIET - DYS 1 Room service appropriate? Yes; Fluid consistency: Nectar Thick  Diet effective now              EDUCATION NEEDS:   Not appropriate for education at this time  Skin:  Skin Assessment: Skin Integrity Issues: Skin Integrity Issues:: Stage II Stage II: sacrum  Last BM:  1/22  Height:   Ht Readings from Last  1 Encounters:  03/30/19 4\' 11"  (1.499 m)    Weight:   Wt Readings from Last 1 Encounters:  03/30/19 47.2 kg    BMI:  Body mass index is 21.01 kg/m.  Estimated Nutritional Needs:   Kcal:  1380-1550 kcals  Protein:  68-78 g  Fluid:  >/= 1.4 L     Cori Justus MS, RDN, LDN, CNSC [phone] Pager  [phone] Weekend/On-Call Pager

## 2019-03-30 NOTE — Progress Notes (Signed)
ANTICOAGULATION CONSULT NOTE - Initial Consult  Pharmacy Consult for warfarin Indication: atrial fibrillation  Patient Measurements: Height: 4\' 11"  (149.9 cm) Weight: 104 lb (47.2 kg) IBW/kg (Calculated) : 43.2   Vital Signs: Temp: 98 F (36.7 C) (01/22 0826) Temp Source: Oral (01/22 0826) BP: 110/83 (01/22 0826) Pulse Rate: 112 (01/22 0826)  Labs: Recent Labs    03/13/2019 1457 03/12/2019 1457 04/02/2019 1642 03/30/2019 1656 03/30/19 0741  HGB 11.1*   < >  --  10.9* 10.5*  HCT 37.1  --   --  32.0* 35.6*  PLT 280  --   --   --  263  LABPROT  --   --  31.6*  --  29.3*  INR  --   --  3.1*  --  2.8*  CREATININE 2.02*  --   --   --  1.63*   < > = values in this interval not displayed.     Medical History: Past Medical History:  Diagnosis Date  . Anxiety   . Arthritis   . Atrial fibrillation (HCC)   . Atrial fibrillation (HCC)   . Back pain   . Dysrhythmia   . GERD (gastroesophageal reflux disease)   . HLD (hyperlipidemia)   . Hypertension   . Hypothyroidism   . Insomnia   . Memory impairment   . Osteoarthritis    knee  . Osteoporosis   . PUD (peptic ulcer disease)    hx of upper GI bleeding   . Wears glasses      Assessment: 86 year old female admitted with lethargy. On warfarin 1mg  daily PTA for AFib. INR therapeutic at 2.8. Hgb 10.5, plts wnl.  Goal of Therapy:  INR 2-3 Monitor platelets by anticoagulation protocol: Yes   Plan:  Warfarin 1mg  po x1 Monitor daily INR, CBC, s/s of bleed  Savva Beamer J 03/30/2019,10:39 AM

## 2019-03-30 NOTE — Plan of Care (Signed)
  Problem: Education: Goal: Knowledge of risk factors and measures for prevention of condition will improve 03/30/2019 2301 by Rayford Halsted, RN Outcome: Progressing 03/30/2019 2300 by Rayford Halsted, RN Outcome: Progressing   Problem: Coping: Goal: Psychosocial and spiritual needs will be supported 03/30/2019 2301 by Rayford Halsted, RN Outcome: Progressing 03/30/2019 2300 by Rayford Halsted, RN Outcome: Progressing   Problem: Respiratory: Goal: Will maintain a patent airway 03/30/2019 2301 by Rayford Halsted, RN Outcome: Progressing 03/30/2019 2300 by Rayford Halsted, RN Outcome: Progressing Goal: Complications related to the disease process, condition or treatment will be avoided or minimized 03/30/2019 2301 by Rayford Halsted, RN Outcome: Progressing 03/30/2019 2300 by Rayford Halsted, RN Outcome: Progressing

## 2019-03-30 NOTE — Progress Notes (Addendum)
PROGRESS NOTE                                                                                                                                                                                                             Patient Demographics:    Amanda [name], is a 86 y.o. female, DOB - 27-Jun-1922, MCE:[id number]  Outpatient Primary MD for the patient is Amanda Sacramento, MD    LOS - 1  Admit date - 03/22/2019    Chief Complaint  Patient presents with  . Altered Mental Status       Brief Narrative  Amanda [name] is a 86 y.o. female with medical history significant for COVID-19 diagnosis on 03/26/2027 at nursing home, chronic systolic CHF with EF 20 to 25%, persistent atrial fibrillation, on Coumadin, dementia, HLD, GERD, hypothyroidism, osteoporosis sent to the ER for increasing lethargy, work-up showed possible UTI, aspiration pneumonia, sepsis, dehydration, AKI and she was admitted.   Subjective:    Amanda [name] today remains in bed and appears to be in no distress, unable to answer questions or follow commands reliably.   Assessment  & Plan :     1. Dehydration, weakness, aspiration pneumonia caused by weakness due to COVID-19 infection - her main pulmonary issues are due to aspiration pneumonia, could have underlying COVID-19 pneumonia as well but hard to confirm.  Currently she is on IV steroids, remdesivir, cefepime, will add oral Flagyl, speech has seen the patient and her diet has been adjusted to dysphagia 1 diet with nectar thick liquids.  Continue aspiration precautions and feeding assistance.  Currently stable on room air.  Due to her age she will always remain tenuous.  Discussed and explained to the son in detail.  She had sepsis on admission which seems to have resolved.  Current problem continues to be possibility of aspiration and rapid decline due to her advanced age and baseline frail status.   SpO2: 100 % O2  Flow Rate (L/min): 15 L/min  Recent Labs  Lab 03/27/2019 1642 03/30/19 0741  CRP 36.9* 36.7*  DDIMER 1.47* 1.75*  FERRITIN 461*  --   BNP  --  485.5*  PROCALCITON 2.27 1.76    Hepatic Function Latest Ref Rng & Units 03/30/2019 03/31/2019 04/06/2017  Total Protein 6.5 - 8.1 g/dL 7.4 7.6 6.2(L)  Albumin 3.5 - 5.0  g/dL 2.4(L) 2.3(L) 2.1(L)  AST 15 - 41 U/L 38 40 61(H)  ALT 0 - 44 U/L 17 16 47  Alk Phosphatase 38 - 126 U/L 129(H) 128(H) 97  Total Bilirubin 0.3 - 1.2 mg/dL 1.0 1.1 0.5    2.  Dehydration with hypotension.  IV fluids.  3.  Chronic systolic heart failure with EF 20 to 25% along with chronic atrial fibrillation.  Mali vas 2 score of 3.  Adjusted beta-blocker dose for better rate control, continue Coumadin pharmacy monitoring INR.  Currently dehydrated and requiring gentle IV fluids for hemodynamic support.  4.  Hypothyroidism.  On home dose Synthroid with stable TSH.  5.  Sacral stage II pressure ulcer present on admission.  Nursing care, see nursing note for details.  6.  AKI on CKD stage III.  Baseline creatinine around 1-1/2 years to 2 years ago was 1.1.  This is due to dehydration, gentle IV fluids and monitor.  7.  UTI present on admission.  Continue empiric cefepime.  Follow cultures.    Condition - Extremely Guarded  Family Communication  :  Son on home number [phone] , 03/10/2019, 03/30/19.  Clearly explained that due to her age her condition is tenuous and that we have to take it day by day.  She can pass away any day here or at SNF simply due to her age and comorbidities regardless of the Covid status.  Code Status :  DNR  Diet :   Diet Order            DIET - DYS 1 Room service appropriate? Yes; Fluid consistency: Nectar Thick  Diet effective now               Disposition Plan  :  SNF, still dehydrated with AKI and multiple electrolyte abnormalities  Consults  : Speech therapy  Procedures  : None  PUD Prophylaxis : None  DVT Prophylaxis    Coumadin  Lab Results  Component Value Date   PLT 263 03/30/2019   Lab Results  Component Value Date   INR 2.8 (H) 03/30/2019   INR 3.1 (H) 03/16/2019   INR 1.92 04/07/2017     Inpatient Medications  Scheduled Meds: . vitamin C  500 mg Oral Daily  . colchicine  0.6 mg Oral Daily  . dexamethasone (DECADRON) injection  4 mg Intravenous Q24H  . docusate sodium  200 mg Oral BID  . Ipratropium-Albuterol  1 puff Inhalation Q6H  . levothyroxine  88 mcg Oral Q0600  . LORazepam  0.5 mg Oral QHS  . metoprolol tartrate  50 mg Oral BID  . warfarin  1 mg Oral ONCE-1800  . Warfarin - Pharmacist Dosing Inpatient   Does not apply q1800  . zinc sulfate  220 mg Oral Daily   Continuous Infusions: . ceFEPime (MAXIPIME) IV Stopped (04/08/2019 1856)  . lactated ringers    . remdesivir 100 mg in NS 100 mL    . [START ON 03/31/2019] vancomycin     PRN Meds:.acetaminophen, guaiFENesin-dextromethorphan, ondansetron **OR** ondansetron (ZOFRAN) IV, Resource ThickenUp Clear, traMADol  Antibiotics  :    Anti-infectives (From admission, onward)   Start     Dose/Rate Route Frequency Ordered Stop   03/31/19 1530  vancomycin (VANCOREADY) IVPB 500 mg/100 mL     500 mg 100 mL/hr over 60 Minutes Intravenous Every 48 hours 04/07/2019 1737     03/30/19 1000  remdesivir 100 mg in sodium chloride 0.9 % 100 mL IVPB  100 mg 200 mL/hr over 30 Minutes Intravenous Daily 04/06/2019 1604 04/03/19 0959   03/16/2019 1615  remdesivir 200 mg in sodium chloride 0.9% 250 mL IVPB     200 mg 580 mL/hr over 30 Minutes Intravenous Once 04/02/2019 1604 04/07/2019 1657   04/08/2019 1600  ceFEPIme (MAXIPIME) 1 g in sodium chloride 0.9 % 100 mL IVPB     1 g 200 mL/hr over 30 Minutes Intravenous Every 24 hours 04/02/2019 1550     03/20/2019 1545  vancomycin (VANCOCIN) IVPB 1000 mg/200 mL premix     1,000 mg 200 mL/hr over 60 Minutes Intravenous  Once 03/26/2019 1537 03/10/2019 1857   03/18/2019 1545  meropenem (MERREM) 1 g in sodium chloride  0.9 % 100 mL IVPB  Status:  Discontinued     1 g 200 mL/hr over 30 Minutes Intravenous  Once 04/02/2019 1537 03/22/2019 1545       Time Spent in minutes  30   Lala Lund M.D on 03/30/2019 at 11:37 AM  To page go to [email or web address] - password [email or web address]  [facility] -  Office  [phone]    See all Orders from today for further details    Objective:   Vitals:   03/30/19 0130 03/30/19 0145 03/30/19 0200 03/30/19 0826  BP: 121/61 101/80 116/68 110/83  Pulse: (!) 121 (!) 113 (!) 110 (!) 112  Resp: (!) 26 (!) 26 (!) 25 20  Temp:    98 F (36.7 C)  TempSrc:    Oral  SpO2: 100% 100% 100% 100%  Weight:      Height:        Wt Readings from Last 3 Encounters:  03/30/19 47.2 kg  04/07/17 47.2 kg  09/10/16 52.3 kg     Intake/Output Summary (Last 24 hours) at 03/30/2019 1137 Last data filed at 03/30/2019 0400 Gross per 24 hour  Intake 1179.14 ml  Output 0 ml  Net 1179.14 ml     Physical Exam  Awake, pleasantly stable, moves all 4 extremities by herself [place].AT,PERRAL Supple Neck,No JVD, No cervical lymphadenopathy appriciated.  Symmetrical Chest wall movement, Good air movement bilaterally, CTAB RRR,No Gallops,Rubs or new Murmurs, No Parasternal Heave +ve B.Sounds, Abd Soft, No tenderness, No organomegaly appriciated, No rebound - guarding or rigidity. No Cyanosis, Clubbing or edema, No new Rash or bruise    Data Review:    CBC Recent Labs  Lab 04/01/2019 1457 03/11/2019 1656 03/30/19 0741  WBC 22.5*  --  20.5*  HGB 11.1* 10.9* 10.5*  HCT 37.1 32.0* 35.6*  PLT 280  --  263  MCV 96.4  --  95.2  MCH 28.8  --  28.1  MCHC 29.9*  --  29.5*  RDW 18.8*  --  19.2*  LYMPHSABS  --   --  0.9  MONOABS  --   --  0.6  EOSABS  --   --  0.1  BASOSABS  --   --  0.1    Chemistries  Recent Labs  Lab 03/19/2019 1457 03/24/2019 1656 03/30/19 0741  NA 140 140 143  K 4.4 3.9 4.2  CL 99  --  105  CO2 25  --  24  GLUCOSE 117*  --  122*  BUN 54*  --  55*  CREATININE 2.02*   --  1.63*  CALCIUM 8.9  --  8.4*  AST 40  --  38  ALT 16  --  17  ALKPHOS 128*  --  129*  BILITOT 1.1  --  1.0   ------------------------------------------------------------------------------------------------------------------ Recent Labs    03/11/2019 1642  TRIG 97    No results found for: HGBA1C ------------------------------------------------------------------------------------------------------------------ Recent Labs    03/30/19 0741  TSH 1.059    Cardiac Enzymes No results for input(s): CKMB, TROPONINI, MYOGLOBIN in the last 168 hours.  Invalid input(s): CK ------------------------------------------------------------------------------------------------------------------    Component Value Date/Time   BNP 485.5 (H) 03/30/2019 0741    Micro Results Recent Results (from the past 240 hour(s))  Blood culture (routine x 2)     Status: None (Preliminary result)   Collection Time: 04/01/2019  2:58 PM   Specimen: BLOOD  Result Value Ref Range Status   Specimen Description BLOOD RIGHT ANTECUBITAL  Final   Special Requests   Final    BOTTLES DRAWN AEROBIC AND ANAEROBIC Blood Culture results may not be optimal due to an inadequate volume of blood received in culture bottles   Culture   Final    NO GROWTH < 24 HOURS Performed at [facility], [place] [address] [place], [place] [address]    Report Status PENDING  Incomplete  Blood culture (routine x 2)     Status: None (Preliminary result)   Collection Time: 03/16/2019  2:58 PM   Specimen: BLOOD  Result Value Ref Range Status   Specimen Description BLOOD LEFT ANTECUBITAL  Final   Special Requests   Final    BOTTLES DRAWN AEROBIC ONLY Blood Culture adequate volume   Culture  Setup Time   Final    GRAM POSITIVE COCCI IN CLUSTERS AEROBIC BOTTLE ONLY CRITICAL RESULT CALLED TO, READ BACK BY AND VERIFIED WITH: Leland [id number] FCP Performed at [facility], [place] [address] [place], [place] [address]     Culture GRAM POSITIVE COCCI  Final   Report Status PENDING  Incomplete    Radiology Reports DG Chest Port 1 View  Result Date: 03/21/2019 CLINICAL DATA:  Altered mental status. EXAM: PORTABLE CHEST 1 VIEW COMPARISON:  04/04/2017 FINDINGS: The cardiac silhouette, mediastinal and hilar contours are within normal limits. There is mild tortuosity and calcification of the thoracic aorta. Patchy ill-defined airspace opacity the right lung base suspicious for pneumonia. No definite pleural effusion. The left lung is relatively clear. IMPRESSION: Right lower lobe infiltrate. Electronically Signed   By: Marijo Sanes M.D.   On: 04/03/2019 16:27

## 2019-03-31 LAB — COMPREHENSIVE METABOLIC PANEL
ALT: 13 U/L (ref 0–44)
AST: 31 U/L (ref 15–41)
Albumin: 1.9 g/dL — ABNORMAL LOW (ref 3.5–5.0)
Alkaline Phosphatase: 113 U/L (ref 38–126)
Anion gap: 11 (ref 5–15)
BUN: 60 mg/dL — ABNORMAL HIGH (ref 8–23)
CO2: 25 mmol/L (ref 22–32)
Calcium: 8.2 mg/dL — ABNORMAL LOW (ref 8.9–10.3)
Chloride: 110 mmol/L (ref 98–111)
Creatinine, Ser: 1.47 mg/dL — ABNORMAL HIGH (ref 0.44–1.00)
GFR calc Af Amer: 35 mL/min — ABNORMAL LOW (ref >60)
GFR calc non Af Amer: 30 mL/min — ABNORMAL LOW (ref >60)
Glucose, Bld: 117 mg/dL — ABNORMAL HIGH (ref 70–99)
Potassium: 4 mmol/L (ref 3.5–5.1)
Sodium: 146 mmol/L — ABNORMAL HIGH (ref 135–145)
Total Bilirubin: 0.7 mg/dL (ref 0.3–1.2)
Total Protein: 6.1 g/dL — ABNORMAL LOW (ref 6.5–8.1)

## 2019-03-31 LAB — CBC WITH DIFFERENTIAL/PLATELET
Abs Immature Granulocytes: 0.14 10*3/uL — ABNORMAL HIGH (ref 0.00–0.07)
Basophils Absolute: 0.1 10*3/uL (ref 0.0–0.1)
Basophils Relative: 0 %
Eosinophils Absolute: 0.1 10*3/uL (ref 0.0–0.5)
Eosinophils Relative: 1 %
HCT: 32.4 % — ABNORMAL LOW (ref 36.0–46.0)
Hemoglobin: 9.9 g/dL — ABNORMAL LOW (ref 12.0–15.0)
Immature Granulocytes: 1 %
Lymphocytes Relative: 7 %
Lymphs Abs: 1.1 10*3/uL (ref 0.7–4.0)
MCH: 29 pg (ref 26.0–34.0)
MCHC: 30.6 g/dL (ref 30.0–36.0)
MCV: 95 fL (ref 80.0–100.0)
Monocytes Absolute: 0.5 10*3/uL (ref 0.1–1.0)
Monocytes Relative: 4 %
Neutro Abs: 13.5 10*3/uL — ABNORMAL HIGH (ref 1.7–7.7)
Neutrophils Relative %: 87 %
Platelets: 255 10*3/uL (ref 150–400)
RBC: 3.41 MIL/uL — ABNORMAL LOW (ref 3.87–5.11)
RDW: 19.1 % — ABNORMAL HIGH (ref 11.5–15.5)
WBC: 15.4 10*3/uL — ABNORMAL HIGH (ref 4.0–10.5)
nRBC: 0 % (ref 0.0–0.2)

## 2019-03-31 LAB — BRAIN NATRIURETIC PEPTIDE: B Natriuretic Peptide: 575.4 pg/mL — ABNORMAL HIGH (ref 0.0–100.0)

## 2019-03-31 LAB — PROTIME-INR
INR: 2.5 — ABNORMAL HIGH (ref 0.8–1.2)
Prothrombin Time: 27.3 seconds — ABNORMAL HIGH (ref 11.4–15.2)

## 2019-03-31 LAB — PROCALCITONIN: Procalcitonin: 1.58 ng/mL

## 2019-03-31 LAB — D-DIMER, QUANTITATIVE: D-Dimer, Quant: 1.56 ug/mL-FEU — ABNORMAL HIGH (ref 0.00–0.50)

## 2019-03-31 LAB — MAGNESIUM: Magnesium: 1.8 mg/dL (ref 1.7–2.4)

## 2019-03-31 LAB — C-REACTIVE PROTEIN: CRP: 26.7 mg/dL — ABNORMAL HIGH (ref <1.0)

## 2019-03-31 MED ORDER — MORPHINE SULFATE (PF) 2 MG/ML IV SOLN
2.0000 mg | Freq: Four times a day (QID) | INTRAVENOUS | Status: DC
  Administered 2019-03-31 (×4): 2 mg via INTRAVENOUS
  Filled 2019-03-31 (×4): qty 1

## 2019-03-31 NOTE — Progress Notes (Signed)
PT Cancellation Note  Patient Details Name: Amanda [name] MRN: [id number] DOB: March 24, 1922   Cancelled Treatment:     Orders received and chart reviewed, arrived on floor to find pt had been placed on care and comfort measures.  Amanda [name], PT    Amanda [name] 03/31/2019, 2:40 PM

## 2019-03-31 NOTE — Plan of Care (Addendum)
Patient in bed. MD changed status to comfort care. Oral care performed. Updated family via facetime. Will continue to monitor for remainder of shift.    Problem: Education: Goal: Knowledge of risk factors and measures for prevention of condition will improve 03/31/2019 0918 by Winnifred Friar, RN Outcome: Not Progressing 03/31/2019 0917 by Winnifred Friar, RN Outcome: Not Progressing   Problem: Coping: Goal: Psychosocial and spiritual needs will be supported 03/31/2019 8343 by Winnifred Friar, RN Outcome: Not Progressing 03/31/2019 0917 by Winnifred Friar, RN Outcome: Not Progressing   Problem: Respiratory: Goal: Will maintain a patent airway 03/31/2019 0918 by Winnifred Friar, RN Outcome: Not Progressing 03/31/2019 0917 by Winnifred Friar, RN Outcome: Not Progressing Goal: Complications related to the disease process, condition or treatment will be avoided or minimized 03/31/2019 0918 by Winnifred Friar, RN Outcome: Not Progressing 03/31/2019 0917 by Winnifred Friar, RN Outcome: Not Progressing

## 2019-03-31 NOTE — Progress Notes (Signed)
PROGRESS NOTE                                                                                                                                                                                                             Patient Demographics:    Amanda [name], is a 86 y.o. female, DOB - Nov 06, 1922, MWN:[id number]  Outpatient Primary MD for the patient is Christain Sacramento, MD    LOS - 2  Admit date - 03/13/2019    Chief Complaint  Patient presents with  . Altered Mental Status       Brief Narrative  Amanda [name] is a 86 y.o. female with medical history significant for COVID-19 diagnosis on 03/26/2027 at nursing home, chronic systolic CHF with EF 20 to 25%, persistent atrial fibrillation, on Coumadin, dementia, HLD, GERD, hypothyroidism, osteoporosis sent to the ER for increasing lethargy, work-up showed possible UTI, aspiration pneumonia, sepsis, dehydration, AKI and she was admitted.   Subjective:    Amanda [name] today remains in bed and appears to be in distress, moaning but unable to answer questions reliably.   Assessment  & Plan :   Patient was admitted for aspiration and COVID-19 pneumonia along with UTI, she was septic on admission, initially she responded well to supportive care and treatment however soon thereafter she became encephalopathic, stopped eating or drinking, eventually she started to moan and groan more more and appeared to be very uncomfortable.  Was unable to answer questions or follow commands reliably.  Due to her advanced age and poor baseline status she was transitioned to full comfort care night of 03/30/2019.  I also discussed this plan with patient's son and niece in detail.  Goal of care will be comfort.  If she survives she will go to residential hospice.  Expect her passing away in the next few days.   1. Dehydration, weakness, aspiration pneumonia caused by weakness due to COVID-19 infection - her  main pulmonary issues are due to aspiration pneumonia, could have underlying COVID-19 pneumonia as well but hard to confirm.  Treated with IV fluids, IV antibiotics, steroids and remdesivir.  Sepsis pathophysiology has resolved.[place] Kitchen   SpO2: 94 % O2 Flow Rate (L/min): 15 L/min  Recent Labs  Lab 03/15/2019 1642 03/30/19 0741 03/31/19 0211  CRP 36.9* 36.7* 26.7*  DDIMER 1.47* 1.75* 1.56*  FERRITIN 461*  --   --  BNP  --  485.5* 575.4*  PROCALCITON 2.27 1.76 1.58    Hepatic Function Latest Ref Rng & Units 03/31/2019 03/30/2019 03/20/2019  Total Protein 6.5 - 8.1 g/dL 6.1(L) 7.4 7.6  Albumin 3.5 - 5.0 g/dL 1.9(L) 2.4(L) 2.3(L)  AST 15 - 41 U/L 31 38 40  ALT 0 - 44 U/L 13 17 16   Alk Phosphatase 38 - 126 U/L 113 129(H) 128(H)  Total Bilirubin 0.3 - 1.2 mg/dL 0.7 1.0 1.1    2.  Dehydration with hypotension.  Was given IV fluids  3.  Chronic systolic heart failure with EF 20 to 25% along with chronic atrial fibrillation.  Mali vas 2 score of 3.  He knows on beta-blocker and Coumadin now goals of care full comfort.  4.  Hypothyroidism.  On home dose Synthroid with stable TSH.  5.  Sacral stage II pressure ulcer present on admission.  Nursing care, see nursing note for details.  6.  AKI on CKD stage III.  Baseline creatinine around 1-1/2 years to 2 years ago was 1.1.  Due to dehydration was hydrated but now on comfort measures.  7.  UTI present on admission.  Will stop all noncomfort treatment    Condition - Extremely Guarded  Family Communication  :  Son on home number [phone] , 03/09/2019, 03/30/19.  Clearly explained that due to her age her condition is tenuous and that we have to take it day by day.  She can pass away any day here or at SNF simply due to her age and comorbidities regardless of the Covid status.   Discussed in detail with son and his niece again on 03/31/2019.  Comfort measures now.  Code Status :  DNR  Diet :   Diet Order            DIET - DYS 1 Room service  appropriate? Yes; Fluid consistency: Nectar Thick  Diet effective now               Disposition Plan  : Residential hospice  Consults  : Speech therapy  Procedures  : None  PUD Prophylaxis : None  DVT Prophylaxis   Coumadin  Lab Results  Component Value Date   PLT 255 03/31/2019   Lab Results  Component Value Date   INR 2.5 (H) 03/31/2019   INR 2.8 (H) 03/30/2019   INR 3.1 (H) 03/14/2019     Inpatient Medications  Scheduled Meds: . Ipratropium-Albuterol  1 puff Inhalation Q6H  . levothyroxine  88 mcg Oral Q0600  . liver oil-zinc oxide   Topical BID  .  morphine injection  2 mg Intravenous QID   Continuous Infusions:  PRN Meds:.acetaminophen, guaiFENesin-dextromethorphan, haloperidol lactate, [DISCONTINUED] ondansetron **OR** ondansetron (ZOFRAN) IV, Resource ThickenUp Clear  Antibiotics  :    Anti-infectives (From admission, onward)   Start     Dose/Rate Route Frequency Ordered Stop   03/31/19 1530  vancomycin (VANCOREADY) IVPB 500 mg/100 mL  Status:  Discontinued     500 mg 100 mL/hr over 60 Minutes Intravenous Every 48 hours 04/03/2019 1737 03/31/19 0935   03/30/19 1600  cefTRIAXone (ROCEPHIN) 2 g in sodium chloride 0.9 % 100 mL IVPB  Status:  Discontinued     2 g 200 mL/hr over 30 Minutes Intravenous Every 24 hours 03/30/19 1234 03/31/19 0935   03/30/19 1245  metroNIDAZOLE (FLAGYL) tablet 500 mg  Status:  Discontinued     500 mg Oral Every 8 hours 03/30/19 1143 03/31/19 0935  03/30/19 1000  remdesivir 100 mg in sodium chloride 0.9 % 100 mL IVPB  Status:  Discontinued     100 mg 200 mL/hr over 30 Minutes Intravenous Daily 04/05/2019 1604 03/31/19 0935   03/30/2019 1615  remdesivir 200 mg in sodium chloride 0.9% 250 mL IVPB     200 mg 580 mL/hr over 30 Minutes Intravenous Once 03/28/2019 1604 03/28/2019 1657   04/02/2019 1600  ceFEPIme (MAXIPIME) 1 g in sodium chloride 0.9 % 100 mL IVPB  Status:  Discontinued     1 g 200 mL/hr over 30 Minutes Intravenous Every 24  hours 04/02/2019 1550 03/30/19 1234   04/02/2019 1545  vancomycin (VANCOCIN) IVPB 1000 mg/200 mL premix     1,000 mg 200 mL/hr over 60 Minutes Intravenous  Once 04/02/2019 1537 03/27/2019 1857   03/26/2019 1545  meropenem (MERREM) 1 g in sodium chloride 0.9 % 100 mL IVPB  Status:  Discontinued     1 g 200 mL/hr over 30 Minutes Intravenous  Once 03/10/2019 1537 04/02/2019 1545       Time Spent in minutes  30   Lala Lund M.D on 03/31/2019 at 9:36 AM  To page go to [email or web address] - password [email or web address]  [facility] -  Office  [phone]    See all Orders from today for further details    Objective:   Vitals:   03/30/19 1228 03/30/19 2100 03/31/19 0344 03/31/19 0800  BP: (!) 111/96 (!) 94/58 111/70 133/88  Pulse: (!) 118 97  (!) 134  Resp:  (!) 24 16   Temp:  (!) 97.5 F (36.4 C) 97.7 F (36.5 C) 98.5 F (36.9 C)  TempSrc:  Axillary Axillary Axillary  SpO2:  95% 96% 94%  Weight:      Height:        Wt Readings from Last 3 Encounters:  03/30/19 47.2 kg  04/07/17 47.2 kg  09/10/16 52.3 kg     Intake/Output Summary (Last 24 hours) at 03/31/2019 0936 Last data filed at 03/31/2019 0400 Gross per 24 hour  Intake --  Output 250 ml  Net -250 ml     Physical Exam  Extremely cachectic frail elderly white female lying in hospital bed moaning, unable to answer questions or follow commands Bilateral breath sounds Regular rate rhythm Abdomen soft No edema   Data Review:    CBC Recent Labs  Lab 04/06/2019 1457 03/11/2019 1656 03/30/19 0741 03/31/19 0211  WBC 22.5*  --  20.5* 15.4*  HGB 11.1* 10.9* 10.5* 9.9*  HCT 37.1 32.0* 35.6* 32.4*  PLT 280  --  263 255  MCV 96.4  --  95.2 95.0  MCH 28.8  --  28.1 29.0  MCHC 29.9*  --  29.5* 30.6  RDW 18.8*  --  19.2* 19.1*  LYMPHSABS  --   --  0.9 1.1  MONOABS  --   --  0.6 0.5  EOSABS  --   --  0.1 0.1  BASOSABS  --   --  0.1 0.1    Chemistries  Recent Labs  Lab 04/02/2019 1457 03/28/2019 1656 03/30/19 0741 03/31/19 0211   NA 140 140 143 146*  K 4.4 3.9 4.2 4.0  CL 99  --  105 110  CO2 25  --  24 25  GLUCOSE 117*  --  122* 117*  BUN 54*  --  55* 60*  CREATININE 2.02*  --  1.63* 1.47*  CALCIUM 8.9  --  8.4* 8.2*  MG  --   --   --  1.8  AST 40  --  38 31  ALT 16  --  17 13  ALKPHOS 128*  --  129* 113  BILITOT 1.1  --  1.0 0.7   ------------------------------------------------------------------------------------------------------------------ Recent Labs    03/24/2019 1642  TRIG 97    No results found for: HGBA1C ------------------------------------------------------------------------------------------------------------------ Recent Labs    03/30/19 0741  TSH 1.059    Cardiac Enzymes No results for input(s): CKMB, TROPONINI, MYOGLOBIN in the last 168 hours.  Invalid input(s): CK ------------------------------------------------------------------------------------------------------------------    Component Value Date/Time   BNP 575.4 (H) 03/31/2019 0211    Micro Results Recent Results (from the past 240 hour(s))  Blood culture (routine x 2)     Status: None (Preliminary result)   Collection Time: 03/25/2019  2:58 PM   Specimen: BLOOD  Result Value Ref Range Status   Specimen Description BLOOD RIGHT ANTECUBITAL  Final   Special Requests   Final    BOTTLES DRAWN AEROBIC AND ANAEROBIC Blood Culture results may not be optimal due to an inadequate volume of blood received in culture bottles   Culture   Final    NO GROWTH 2 DAYS Performed at [facility], [place] [address], [place], [place] [address]    Report Status PENDING  Incomplete  Blood culture (routine x 2)     Status: Abnormal (Preliminary result)   Collection Time: 04/07/2019  2:58 PM   Specimen: BLOOD  Result Value Ref Range Status   Specimen Description BLOOD LEFT ANTECUBITAL  Final   Special Requests   Final    BOTTLES DRAWN AEROBIC ONLY Blood Culture adequate volume   Culture  Setup Time   Final    GRAM POSITIVE COCCI IN  CLUSTERS AEROBIC BOTTLE ONLY CRITICAL RESULT CALLED TO, READ BACK BY AND VERIFIED WITH: PHARMD RUTH  CLARK [id number] FCP    Culture (A)  Final    STAPHYLOCOCCUS AUREUS SUSCEPTIBILITIES TO FOLLOW Performed at [facility], [place] [address], [place],  [address]    Report Status PENDING  Incomplete  Urine culture     Status: Abnormal (Preliminary result)   Collection Time: 03/19/2019  4:44 PM   Specimen: Urine, Clean Catch  Result Value Ref Range Status   Specimen Description URINE, CLEAN CATCH  Final   Special Requests   Final    NONE Performed at [facility], [place] [address] [place],  [address]    Culture (A)  Final    >=100,000 COLONIES/mL PROVIDENCIA STUARTII >=100,000 COLONIES/mL KLEBSIELLA PNEUMONIAE    Report Status PENDING  Incomplete    Radiology Reports DG Chest Port 1 View  Result Date: 03/21/2019 CLINICAL DATA:  Altered mental status. EXAM: PORTABLE CHEST 1 VIEW COMPARISON:  04/04/2017 FINDINGS: The cardiac silhouette, mediastinal and hilar contours are within normal limits. There is mild tortuosity and calcification of the thoracic aorta. Patchy ill-defined airspace opacity the right lung base suspicious for pneumonia. No definite pleural effusion. The left lung is relatively clear. IMPRESSION: Right lower lobe infiltrate. Electronically Signed   By: Marijo Sanes M.D.   On: 04/08/2019 16:27

## 2019-04-01 ENCOUNTER — Other Ambulatory Visit: Payer: Self-pay

## 2019-04-01 DIAGNOSIS — L8990 Pressure ulcer of unspecified site, unspecified stage: Secondary | ICD-10-CM | POA: Insufficient documentation

## 2019-04-01 LAB — URINE CULTURE: Culture: >=100000 — AB

## 2019-04-01 LAB — CULTURE, BLOOD (ROUTINE X 2): Special Requests: ADEQUATE

## 2019-04-01 MED ORDER — LORAZEPAM 1 MG PO TABS
1.0000 mg | ORAL_TABLET | ORAL | Status: DC | PRN
  Administered 2019-04-01: 1 mg via ORAL

## 2019-04-01 MED ORDER — HYDRALAZINE HCL 20 MG/ML IJ SOLN: 10.0000 mg | Freq: Four times a day (QID) | INTRAMUSCULAR | Status: DC | PRN

## 2019-04-01 MED ORDER — LORAZEPAM 2 MG/ML IJ SOLN
1.0000 mg | INTRAMUSCULAR | Status: DC | PRN
  Administered 2019-04-01 (×2): 1 mg via INTRAVENOUS
  Filled 2019-04-01 (×3): qty 1

## 2019-04-01 MED ORDER — LORAZEPAM 2 MG/ML PO CONC: 1.0000 mg | ORAL | Status: DC | PRN

## 2019-04-01 MED ORDER — MORPHINE SULFATE (PF) 2 MG/ML IV SOLN
2.0000 mg | INTRAVENOUS | Status: DC | PRN
  Administered 2019-04-01 – 2019-04-03 (×8): 2 mg via INTRAVENOUS
  Filled 2019-04-01 (×9): qty 1

## 2019-04-01 MED ORDER — LIP MEDEX EX OINT
TOPICAL_OINTMENT | CUTANEOUS | Status: DC | PRN
  Filled 2019-04-01: qty 7

## 2019-04-01 NOTE — Plan of Care (Signed)
  Problem: Education: Goal: Knowledge of risk factors and measures for prevention of condition will improve 04/01/2019 1030 by Winnifred Friar, RN Outcome: Not Progressing 04/01/2019 1030 by Winnifred Friar, RN Outcome: Not Progressing   Problem: Coping: Goal: Psychosocial and spiritual needs will be supported 04/01/2019 1030 by Winnifred Friar, RN Outcome: Not Progressing 04/01/2019 1030 by Winnifred Friar, RN Outcome: Not Progressing   Problem: Respiratory: Goal: Will maintain a patent airway 04/01/2019 1030 by Winnifred Friar, RN Outcome: Not Progressing 04/01/2019 1030 by Winnifred Friar, RN Outcome: Not Progressing Goal: Complications related to the disease process, condition or treatment will be avoided or minimized 04/01/2019 1030 by Winnifred Friar, RN Outcome: Not Progressing 04/01/2019 1030 by Winnifred Friar, RN Outcome: Not Progressing

## 2019-04-01 NOTE — Progress Notes (Signed)
PROGRESS NOTE                                                                                                                                                                                                             Patient Demographics:    Amanda [name], is a 86 y.o. female, DOB - 27-Jul-1922, QRF:[id number]  Outpatient Primary MD for the patient is Christain Sacramento, MD    LOS - 3  Admit date - 04/07/2019    Chief Complaint  Patient presents with  . Altered Mental Status       Brief Narrative  Amanda [name] is a 86 y.o. female with medical history significant for COVID-19 diagnosis on 03/26/2027 at nursing home, chronic systolic CHF with EF 20 to 25%, persistent atrial fibrillation, on Coumadin, dementia, HLD, GERD, hypothyroidism, osteoporosis sent to the ER for increasing lethargy, work-up showed possible UTI, aspiration pneumonia, sepsis, dehydration, AKI and she was admitted.   Subjective:    [name] today remains in bed and appears to be in distress, moaning but unable to answer questions reliably.   Assessment  & Plan :   Patient was admitted for aspiration and COVID-19 pneumonia along with UTI, she was septic on admission, initially she responded well to supportive care and treatment however soon thereafter she became encephalopathic, stopped eating or drinking, eventually she started to moan and groan more more and appeared to be very uncomfortable.  Was unable to answer questions or follow commands reliably.  Due to her advanced age and poor baseline status she was transitioned to full comfort care night of 03/30/2019.  I also discussed this plan with patient's son and niece in detail.  Goal of care will be comfort.  If she survives she will go to residential hospice.  Expect her passing away in the next few days.   1. Dehydration, weakness, aspiration pneumonia caused by weakness due to COVID-19 infection - her  main pulmonary issues are due to aspiration pneumonia, could have underlying COVID-19 pneumonia as well but hard to confirm.  Treated with IV fluids, IV antibiotics, steroids and remdesivir.  Sepsis pathophysiology has resolved.Marland Kitchen   SpO2: 97 % O2 Flow Rate (L/min): 15 L/min  Recent Labs  Lab 03/31/2019 1642 03/30/19 0741 03/31/19 0211  CRP 36.9* 36.7* 26.7*  DDIMER 1.47* 1.75* 1.56*  FERRITIN 461*  --   --  BNP  --  485.5* 575.4*  PROCALCITON 2.27 1.76 1.58    Hepatic Function Latest Ref Rng & Units 03/31/2019 03/30/2019 03/22/2019  Total Protein 6.5 - 8.1 g/dL 6.1(L) 7.4 7.6  Albumin 3.5 - 5.0 g/dL 1.9(L) 2.4(L) 2.3(L)  AST 15 - 41 U/L 31 38 40  ALT 0 - 44 U/L _0 Alk Phosphatase 38 - 126 U/L 113 129(H) 128(H)  Total Bilirubin 0.3 - 1.2 mg/dL 0.7 1.0 1.1    2.  Dehydration with hypotension.  Was given IV fluids  3.  Chronic systolic heart failure with EF 20 to 25% along with chronic atrial fibrillation.  Mali vas 2 score of 3.  He knows on beta-blocker and Coumadin now goals of care full comfort.  4.  Hypothyroidism.  On home dose Synthroid with stable TSH.  5.  Sacral stage II pressure ulcer present on admission.  Nursing care, see nursing note for details.  6.  AKI on CKD stage III.  Baseline creatinine around 1-1/2 years to 2 years ago was 1.1.  Due to dehydration was hydrated but now on comfort measures.  7.  UTI present on admission.  Will stop all noncomfort treatment    Condition - Extremely Guarded  Family Communication  :  Son on home number [phone] , 03/12/2019, 03/30/19.  Clearly explained that due to her age her condition is tenuous and that we have to take it day by day.  She can pass away any day here or at SNF simply due to her age and comorbidities regardless of the Covid status.   Discussed in detail with son and his niece again on 03/31/2019.  Comfort measures now.  Code Status :  DNR  Diet :   Diet Order            DIET - DYS 1 Room service  appropriate? Yes; Fluid consistency: Nectar Thick  Diet effective now               Disposition Plan  : Residential hospice  Consults  : Speech therapy  Procedures  : None  PUD Prophylaxis : None  DVT Prophylaxis   Coumadin  Lab Results  Component Value Date   PLT 255 03/31/2019   Lab Results  Component Value Date   INR 2.5 (H) 03/31/2019   INR 2.8 (H) 03/30/2019   INR 3.1 (H) 03/12/2019     Inpatient Medications  Scheduled Meds: . Ipratropium-Albuterol  1 puff Inhalation Q6H  . levothyroxine  88 mcg Oral Q0600  . liver oil-zinc oxide   Topical BID   Continuous Infusions:  PRN Meds:.acetaminophen, guaiFENesin-dextromethorphan, haloperidol lactate, hydrALAZINE, lip balm, LORazepam **OR** LORazepam **OR** LORazepam, morphine injection, [DISCONTINUED] ondansetron **OR** ondansetron (ZOFRAN) IV, Resource ThickenUp Clear  Antibiotics  :    Anti-infectives (From admission, onward)   Start     Dose/Rate Route Frequency Ordered Stop   03/31/19 1530  vancomycin (VANCOREADY) IVPB 500 mg/100 mL  Status:  Discontinued     500 mg 100 mL/hr over 60 Minutes Intravenous Every 48 hours 03/16/2019 1737 03/31/19 0935   03/30/19 1600  cefTRIAXone (ROCEPHIN) 2 g in sodium chloride 0.9 % 100 mL IVPB  Status:  Discontinued     2 g 200 mL/hr over 30 Minutes Intravenous Every 24 hours 03/30/19 1234 03/31/19 0935   03/30/19 1245  metroNIDAZOLE (FLAGYL) tablet 500 mg  Status:  Discontinued     500 mg Oral Every 8 hours 03/30/19 1143 03/31/19 0935  03/30/19 1000  remdesivir 100 mg in sodium chloride 0.9 % 100 mL IVPB  Status:  Discontinued     100 mg 200 mL/hr over 30 Minutes Intravenous Daily 04/08/2019 1604 03/31/19 0935   03/18/2019 1615  remdesivir 200 mg in sodium chloride 0.9% 250 mL IVPB     200 mg 580 mL/hr over 30 Minutes Intravenous Once 03/31/2019 1604 03/22/2019 1657   04/03/2019 1600  ceFEPIme (MAXIPIME) 1 g in sodium chloride 0.9 % 100 mL IVPB  Status:  Discontinued     1 g 200  mL/hr over 30 Minutes Intravenous Every 24 hours 03/13/2019 1550 03/30/19 1234   03/22/2019 1545  vancomycin (VANCOCIN) IVPB 1000 mg/200 mL premix     1,000 mg 200 mL/hr over 60 Minutes Intravenous  Once 03/12/2019 1537 03/12/2019 1857   04/01/2019 1545  meropenem (MERREM) 1 g in sodium chloride 0.9 % 100 mL IVPB  Status:  Discontinued     1 g 200 mL/hr over 30 Minutes Intravenous  Once 03/16/2019 1537 03/17/2019 1545       Time Spent in minutes  30   Lala Lund M.D on 04/01/2019 at 9:14 AM  To page go to [email or web address] - password [facility]  [facility] -  Office  [phone]    See all Orders from today for further details    Objective:   Vitals:   03/31/19 0800 03/31/19 1600 04/01/19 0559 04/01/19 0744  BP: 133/88 127/85 (!) 135/100 132/87  Pulse: (!) 134 (!) 104 (!) 107 (!) 133  Resp:   16   Temp: 98.5 F (36.9 C) 97.7 F (36.5 C) (!) 97.2 F (36.2 C) (!) 97.4 F (36.3 C)  TempSrc: Axillary Axillary Axillary Axillary  SpO2: 94% 96% 94% 97%  Weight:      Height:        Wt Readings from Last 3 Encounters:  03/30/19 47.2 kg  04/07/17 47.2 kg  09/10/16 52.3 kg     Intake/Output Summary (Last 24 hours) at 04/01/2019 0914 Last data filed at 03/31/2019 2000 Gross per 24 hour  Intake 0 ml  Output --  Net 0 ml     Physical Exam  Extremely cachectic frail elderly white female lying in hospital bed moaning, unable to answer questions or follow commands Bilateral breath sounds Regular rate rhythm Abdomen soft No edema   Data Review:    CBC Recent Labs  Lab 03/18/2019 1457 03/25/2019 1656 03/30/19 0741 03/31/19 0211  WBC 22.5*  --  20.5* 15.4*  HGB 11.1* 10.9* 10.5* 9.9*  HCT 37.1 32.0* 35.6* 32.4*  PLT 280  --  263 255  MCV 96.4  --  95.2 95.0  MCH 28.8  --  28.1 29.0  MCHC 29.9*  --  29.5* 30.6  RDW 18.8*  --  19.2* 19.1*  LYMPHSABS  --   --  0.9 1.1  MONOABS  --   --  0.6 0.5  EOSABS  --   --  0.1 0.1  BASOSABS  --   --  0.1 0.1    Chemistries   Recent Labs  Lab 04/03/2019 1457 03/16/2019 1656 03/30/19 0741 03/31/19 0211  NA 140 140 143 146*  K 4.4 3.9 4.2 4.0  CL 99  --  105 110  CO2 25  --  24 25  GLUCOSE 117*  --  122* 117*  BUN 54*  --  55* 60*  CREATININE 2.02*  --  1.63* 1.47*  CALCIUM 8.9  --  8.4* 8.2*  MG  --   --   --  1.8  AST 40  --  38 31  ALT 16  --  17 13  ALKPHOS 128*  --  129* 113  BILITOT 1.1  --  1.0 0.7   ------------------------------------------------------------------------------------------------------------------ Recent Labs    03/13/2019 1642  TRIG 97    No results found for: HGBA1C ------------------------------------------------------------------------------------------------------------------ Recent Labs    03/30/19 0741  TSH 1.059    Cardiac Enzymes No results for input(s): CKMB, TROPONINI, MYOGLOBIN in the last 168 hours.  Invalid input(s): CK ------------------------------------------------------------------------------------------------------------------    Component Value Date/Time   BNP 575.4 (H) 03/31/2019 0211    Micro Results Recent Results (from the past 240 hour(s))  Blood culture (routine x 2)     Status: None (Preliminary result)   Collection Time: 03/21/2019  2:58 PM   Specimen: BLOOD  Result Value Ref Range Status   Specimen Description BLOOD RIGHT ANTECUBITAL  Final   Special Requests   Final    BOTTLES DRAWN AEROBIC AND ANAEROBIC Blood Culture results may not be optimal due to an inadequate volume of blood received in culture bottles   Culture   Final    NO GROWTH 2 DAYS Performed at [facility], [place] [address] [place], [place] [address]    Report Status PENDING  Incomplete  Blood culture (routine x 2)     Status: Abnormal   Collection Time: 03/27/2019  2:58 PM   Specimen: BLOOD  Result Value Ref Range Status   Specimen Description BLOOD LEFT ANTECUBITAL  Final   Special Requests   Final    BOTTLES DRAWN AEROBIC ONLY Blood Culture adequate volume    Culture  Setup Time   Final    GRAM POSITIVE COCCI IN CLUSTERS AEROBIC BOTTLE ONLY CRITICAL RESULT CALLED TO, READ BACK BY AND VERIFIED WITH: [place] [phone] FCP Performed at [facility], [place] [address], [place], [place] [address]    Culture METHICILLIN RESISTANT STAPHYLOCOCCUS AUREUS (A)  Final   Report Status 04/01/2019 FINAL  Final   Organism ID, Bacteria METHICILLIN RESISTANT STAPHYLOCOCCUS AUREUS  Final      Susceptibility   Methicillin resistant staphylococcus aureus - MIC*    CIPROFLOXACIN >=8 RESISTANT Resistant     ERYTHROMYCIN >=8 RESISTANT Resistant     GENTAMICIN <=0.5 SENSITIVE Sensitive     OXACILLIN >=4 RESISTANT Resistant     TETRACYCLINE <=1 SENSITIVE Sensitive     VANCOMYCIN 1 SENSITIVE Sensitive     TRIMETH/SULFA <=10 SENSITIVE Sensitive     CLINDAMYCIN <=0.25 SENSITIVE Sensitive     RIFAMPIN <=0.5 SENSITIVE Sensitive     Inducible Clindamycin NEGATIVE Sensitive     * METHICILLIN RESISTANT STAPHYLOCOCCUS AUREUS  Urine culture     Status: Abnormal   Collection Time: 04/07/2019  4:44 PM   Specimen: Urine, Clean Catch  Result Value Ref Range Status   Specimen Description URINE, CLEAN CATCH  Final   Special Requests   Final    NONE Performed at [facility], [place] [address] [place], [place] [address]    Culture (A)  Final    >=100,000 COLONIES/mL PROVIDENCIA STUARTII >=100,000 COLONIES/mL KLEBSIELLA PNEUMONIAE    Report Status 04/01/2019 FINAL  Final   Organism ID, Bacteria PROVIDENCIA STUARTII (A)  Final   Organism ID, Bacteria KLEBSIELLA PNEUMONIAE (A)  Final      Susceptibility   Klebsiella pneumoniae - MIC*    AMPICILLIN >=32 RESISTANT Resistant     CEFAZOLIN <=4 SENSITIVE Sensitive  CEFTRIAXONE <=0.25 SENSITIVE Sensitive     CIPROFLOXACIN <=0.25 SENSITIVE Sensitive     GENTAMICIN <=1 SENSITIVE Sensitive     IMIPENEM <=0.25 SENSITIVE Sensitive     NITROFURANTOIN 32 SENSITIVE Sensitive     TRIMETH/SULFA <=20 SENSITIVE  Sensitive     AMPICILLIN/SULBACTAM 8 SENSITIVE Sensitive     PIP/TAZO 8 SENSITIVE Sensitive     * >=100,000 COLONIES/mL KLEBSIELLA PNEUMONIAE   Providencia stuartii - MIC*    AMPICILLIN RESISTANT Resistant     CEFAZOLIN >=64 RESISTANT Resistant     CEFTRIAXONE <=0.25 SENSITIVE Sensitive     CIPROFLOXACIN >=4 RESISTANT Resistant     GENTAMICIN RESISTANT Resistant     IMIPENEM 2 SENSITIVE Sensitive     NITROFURANTOIN 128 RESISTANT Resistant     TRIMETH/SULFA <=20 SENSITIVE Sensitive     AMPICILLIN/SULBACTAM 4 SENSITIVE Sensitive     PIP/TAZO 8 SENSITIVE Sensitive     * >=100,000 COLONIES/mL PROVIDENCIA STUARTII    Radiology Reports DG Chest Port 1 View  Result Date: 03/09/2019 CLINICAL DATA:  Altered mental status. EXAM: PORTABLE CHEST 1 VIEW COMPARISON:  04/04/2017 FINDINGS: The cardiac silhouette, mediastinal and hilar contours are within normal limits. There is mild tortuosity and calcification of the thoracic aorta. Patchy ill-defined airspace opacity the right lung base suspicious for pneumonia. No definite pleural effusion. The left lung is relatively clear. IMPRESSION: Right lower lobe infiltrate. Electronically Signed   By: Marijo Sanes M.D.   On: 03/12/2019 16:27

## 2019-04-01 NOTE — Progress Notes (Signed)
Patient son called in for updates, requested to videochat with mother. Pt daughter then called in for updates and requested to videochat. approx 1h spent with children this shift via telephone and videochat, very pleasant and supportive- expressing great sadness/tearful given the situation and inability to be here physically. Patient appears to be in no acute distress, respirations are shallow/even and unlabored. No evidence of pain at this time- no moaning/restlessness/facial grimacing. Pt is lethargic however awakens to verbal stimuli- nonverbal and unable to follow commands. ATC morphine for comfort. Will notify family for any changes. Please continue to provide updates.

## 2019-04-01 NOTE — Progress Notes (Signed)
OT Cancellation Note  Patient Details Name: Amanda [name] MRN: [id number] DOB: November 23, 1922   Cancelled Treatment:    Reason Eval/Treat Not Completed: Other (comment)(Pt placed on comfort care). OT consult received, chart reviewed. Pt transitioned to comfort care on 03/30/19. OT will complete orders at this time.  Peterson Ao 04/01/2019, 1:07 PM

## 2019-04-02 NOTE — Progress Notes (Signed)
PROGRESS NOTE                                                                                                                                                                                                             Patient Demographics:    Amanda [name], is a 86 y.o. female, DOB - 1922/09/23, DHR:[id number]  Outpatient Primary MD for the patient is Christain Sacramento, MD    LOS - 4  Admit date - 04/03/2019    Chief Complaint  Patient presents with  . Altered Mental Status       Brief Narrative  Amanda [name] is a 86 y.o. female with medical history significant for COVID-19 diagnosis on 03/26/2027 at nursing home, chronic systolic CHF with EF 20 to 25%, persistent atrial fibrillation, on Coumadin, dementia, HLD, GERD, hypothyroidism, osteoporosis sent to the ER for increasing lethargy, work-up showed possible UTI, aspiration pneumonia, sepsis, dehydration, AKI and she was admitted.   Subjective:    [name] today remains in bed and appears to be in distress, moaning but unable to answer questions reliably.   Assessment  & Plan :   Patient was admitted for aspiration and COVID-19 pneumonia along with UTI, she was septic on admission, initially she responded well to supportive care and treatment however soon thereafter she became encephalopathic, stopped eating or drinking, eventually she started to moan and groan more more and appeared to be very uncomfortable.  Was unable to answer questions or follow commands reliably.  Due to her advanced age and poor baseline status she was transitioned to full comfort care night of 03/30/2019.  I also discussed this plan with patient's son and niece in detail.  Goal of care will be comfort.  If she survives she will go to residential hospice.  Expect her passing away in the next few days.   1. Dehydration, weakness, aspiration pneumonia caused by weakness due to COVID-19 infection - her  main pulmonary issues are due to aspiration pneumonia, could have underlying COVID-19 pneumonia as well but hard to confirm.  Treated with IV fluids, IV antibiotics, steroids and remdesivir.  Sepsis pathophysiology has resolved.Marland Kitchen   SpO2: (!) 83 % O2 Flow Rate (L/min): 15 L/min  Recent Labs  Lab 04/06/2019 1642 03/30/19 0741 03/31/19 0211  CRP 36.9* 36.7* 26.7*  DDIMER 1.47* 1.75* 1.56*  FERRITIN  461*  --   --   BNP  --  485.5* 575.4*  PROCALCITON 2.27 1.76 1.58    Hepatic Function Latest Ref Rng & Units 03/31/2019 03/30/2019 04/03/2019  Total Protein 6.5 - 8.1 g/dL 6.1(L) 7.4 7.6  Albumin 3.5 - 5.0 g/dL 1.9(L) 2.4(L) 2.3(L)  AST 15 - 41 U/L 31 38 40  ALT 0 - 44 U/L 13 17 16   Alk Phosphatase 38 - 126 U/L 113 129(H) 128(H)  Total Bilirubin 0.3 - 1.2 mg/dL 0.7 1.0 1.1    2.  Dehydration with hypotension.  Was given IV fluids  3.  Chronic systolic heart failure with EF 20 to 25% along with chronic atrial fibrillation.  Mali vas 2 score of 3.  He knows on beta-blocker and Coumadin now goals of care full comfort.  4.  Hypothyroidism.  On home dose Synthroid with stable TSH.  5.  Sacral stage II pressure ulcer present on admission.  Nursing care, see nursing note for details.  6.  AKI on CKD stage III.  Baseline creatinine around 1-1/2 years to 2 years ago was 1.1.  Due to dehydration was hydrated but now on comfort measures.  7.  UTI present on admission.  Will stop all noncomfort treatment    Condition - Extremely Guarded  Family Communication  :  Son on home number [phone] , 04/05/2019, 03/30/19.  Clearly explained that due to her age her condition is tenuous and that we have to take it day by day.  She can pass away any day here or at SNF simply due to her age and comorbidities regardless of the Covid status.   Discussed in detail with son and his niece again on 03/31/2019.  Comfort measures now.  Code Status :  DNR  Diet :   Diet Order            DIET - DYS 1 Room  service appropriate? Yes; Fluid consistency: Nectar Thick  Diet effective now               Disposition Plan  : Residential hospice  Consults  : Speech therapy  Procedures  : None  PUD Prophylaxis : None  DVT Prophylaxis   Coumadin  Lab Results  Component Value Date   PLT 255 03/31/2019   Lab Results  Component Value Date   INR 2.5 (H) 03/31/2019   INR 2.8 (H) 03/30/2019   INR 3.1 (H) 04/08/2019     Inpatient Medications  Scheduled Meds: . Ipratropium-Albuterol  1 puff Inhalation Q6H  . levothyroxine  88 mcg Oral Q0600  . liver oil-zinc oxide   Topical BID   Continuous Infusions:  PRN Meds:.acetaminophen, guaiFENesin-dextromethorphan, haloperidol lactate, hydrALAZINE, lip balm, LORazepam **OR** LORazepam **OR** LORazepam, morphine injection, [DISCONTINUED] ondansetron **OR** ondansetron (ZOFRAN) IV, Resource ThickenUp Clear  Antibiotics  :    Anti-infectives (From admission, onward)   Start     Dose/Rate Route Frequency Ordered Stop   03/31/19 1530  vancomycin (VANCOREADY) IVPB 500 mg/100 mL  Status:  Discontinued     500 mg 100 mL/hr over 60 Minutes Intravenous Every 48 hours 03/26/2019 1737 03/31/19 0935   03/30/19 1600  cefTRIAXone (ROCEPHIN) 2 g in sodium chloride 0.9 % 100 mL IVPB  Status:  Discontinued     2 g 200 mL/hr over 30 Minutes Intravenous Every 24 hours 03/30/19 1234 03/31/19 0935   03/30/19 1245  metroNIDAZOLE (FLAGYL) tablet 500 mg  Status:  Discontinued     500 mg Oral Every  8 hours 03/30/19 1143 03/31/19 0935   03/30/19 1000  remdesivir 100 mg in sodium chloride 0.9 % 100 mL IVPB  Status:  Discontinued     100 mg 200 mL/hr over 30 Minutes Intravenous Daily 04/05/2019 1604 03/31/19 0935   04/07/2019 1615  remdesivir 200 mg in sodium chloride 0.9% 250 mL IVPB     200 mg 580 mL/hr over 30 Minutes Intravenous Once 03/11/2019 1604 03/16/2019 1657   03/14/2019 1600  ceFEPIme (MAXIPIME) 1 g in sodium chloride 0.9 % 100 mL IVPB  Status:  Discontinued     1  g 200 mL/hr over 30 Minutes Intravenous Every 24 hours 03/16/2019 1550 03/30/19 1234   03/16/2019 1545  vancomycin (VANCOCIN) IVPB 1000 mg/200 mL premix     1,000 mg 200 mL/hr over 60 Minutes Intravenous  Once 03/19/2019 1537 03/24/2019 1857   03/09/2019 1545  meropenem (MERREM) 1 g in sodium chloride 0.9 % 100 mL IVPB  Status:  Discontinued     1 g 200 mL/hr over 30 Minutes Intravenous  Once 03/28/2019 1537 04/03/2019 1545       Time Spent in minutes  30   Lala Lund M.D on 04/02/2019 at 9:40 AM  To page go to [email or web address] - password [facility]  [facility] -  Office  [phone]    See all Orders from today for further details    Objective:   Vitals:   03/31/19 1600 04/01/19 0559 04/01/19 0744 04/02/19 0733  BP: 127/85 (!) 135/100 132/87 102/71  Pulse: (!) 104 (!) 107 (!) 133 (!) 139  Resp:  16    Temp: 97.7 F (36.5 C) (!) 97.2 F (36.2 C) (!) 97.4 F (36.3 C) 98.2 F (36.8 C)  TempSrc: Axillary Axillary Axillary Axillary  SpO2: 96% 94% 97% (!) 83%  Weight:      Height:        Wt Readings from Last 3 Encounters:  03/30/19 47.2 kg  04/07/17 47.2 kg  09/10/16 52.3 kg     Intake/Output Summary (Last 24 hours) at 04/02/2019 0940 Last data filed at 04/02/2019 0900 Gross per 24 hour  Intake 0 ml  Output 0 ml  Net 0 ml     Physical Exam  Extremely cachectic frail elderly white female lying in hospital bed moaning, unable to answer questions or follow commands Bilateral breath sounds Regular rate rhythm Abdomen soft No edema   Data Review:    CBC Recent Labs  Lab 03/13/2019 1457 04/02/2019 1656 03/30/19 0741 03/31/19 0211  WBC 22.5*  --  20.5* 15.4*  HGB 11.1* 10.9* 10.5* 9.9*  HCT 37.1 32.0* 35.6* 32.4*  PLT 280  --  263 255  MCV 96.4  --  95.2 95.0  MCH 28.8  --  28.1 29.0  MCHC 29.9*  --  29.5* 30.6  RDW 18.8*  --  19.2* 19.1*  LYMPHSABS  --   --  0.9 1.1  MONOABS  --   --  0.6 0.5  EOSABS  --   --  0.1 0.1  BASOSABS  --   --  0.1 0.1     Chemistries  Recent Labs  Lab 04/01/2019 1457 03/11/2019 1656 03/30/19 0741 03/31/19 0211  NA 140 140 143 146*  K 4.4 3.9 4.2 4.0  CL 99  --  105 110  CO2 25  --  24 25  GLUCOSE 117*  --  122* 117*  BUN 54*  --  55* 60*  CREATININE 2.02*  --  1.63* 1.47*  CALCIUM 8.9  --  8.4* 8.2*  MG  --   --   --  1.8  AST 40  --  38 31  ALT 16  --  17 13  ALKPHOS 128*  --  129* 113  BILITOT 1.1  --  1.0 0.7   ------------------------------------------------------------------------------------------------------------------ No results for input(s): CHOL, HDL, LDLCALC, TRIG, CHOLHDL, LDLDIRECT in the last 72 hours.  No results found for: HGBA1C ------------------------------------------------------------------------------------------------------------------ No results for input(s): TSH, T4TOTAL, T3FREE, THYROIDAB in the last 72 hours.  Invalid input(s): FREET3  Cardiac Enzymes No results for input(s): CKMB, TROPONINI, MYOGLOBIN in the last 168 hours.  Invalid input(s): CK ------------------------------------------------------------------------------------------------------------------    Component Value Date/Time   BNP 575.4 (H) 03/31/2019 0211    Micro Results Recent Results (from the past 240 hour(s))  Blood culture (routine x 2)     Status: None (Preliminary result)   Collection Time: 04/08/2019  2:58 PM   Specimen: BLOOD  Result Value Ref Range Status   Specimen Description BLOOD RIGHT ANTECUBITAL  Final   Special Requests   Final    BOTTLES DRAWN AEROBIC AND ANAEROBIC Blood Culture results may not be optimal due to an inadequate volume of blood received in culture bottles   Culture   Final    NO GROWTH 4 DAYS Performed at [facility], [place] [address] [place], [place] [address]    Report Status PENDING  Incomplete  Blood culture (routine x 2)     Status: Abnormal   Collection Time: 03/14/2019  2:58 PM   Specimen: BLOOD  Result Value Ref Range Status   Specimen  Description BLOOD LEFT ANTECUBITAL  Final   Special Requests   Final    BOTTLES DRAWN AEROBIC ONLY Blood Culture adequate volume   Culture  Setup Time   Final    GRAM POSITIVE COCCI IN CLUSTERS AEROBIC BOTTLE ONLY CRITICAL RESULT CALLED TO, READ BACK BY AND VERIFIED WITH: [place] [id number] FCP Performed at [facility], [place] [address] [place], [place] [address]    Culture METHICILLIN RESISTANT STAPHYLOCOCCUS AUREUS (A)  Final   Report Status 04/01/2019 FINAL  Final   Organism ID, Bacteria METHICILLIN RESISTANT STAPHYLOCOCCUS AUREUS  Final      Susceptibility   Methicillin resistant staphylococcus aureus - MIC*    CIPROFLOXACIN >=8 RESISTANT Resistant     ERYTHROMYCIN >=8 RESISTANT Resistant     GENTAMICIN <=0.5 SENSITIVE Sensitive     OXACILLIN >=4 RESISTANT Resistant     TETRACYCLINE <=1 SENSITIVE Sensitive     VANCOMYCIN 1 SENSITIVE Sensitive     TRIMETH/SULFA <=10 SENSITIVE Sensitive     CLINDAMYCIN <=0.25 SENSITIVE Sensitive     RIFAMPIN <=0.5 SENSITIVE Sensitive     Inducible Clindamycin NEGATIVE Sensitive     * METHICILLIN RESISTANT STAPHYLOCOCCUS AUREUS  Urine culture     Status: Abnormal   Collection Time: 03/30/2019  4:44 PM   Specimen: Urine, Clean Catch  Result Value Ref Range Status   Specimen Description URINE, CLEAN CATCH  Final   Special Requests   Final    NONE Performed at [facility], [place] [address], [place], [place] [address]    Culture (A)  Final    >=100,000 COLONIES/mL PROVIDENCIA STUARTII >=100,000 COLONIES/mL KLEBSIELLA PNEUMONIAE    Report Status 04/01/2019 FINAL  Final   Organism ID, Bacteria PROVIDENCIA STUARTII (A)  Final   Organism ID, Bacteria KLEBSIELLA PNEUMONIAE (A)  Final  Susceptibility   Klebsiella pneumoniae - MIC*    AMPICILLIN >=32 RESISTANT Resistant     CEFAZOLIN <=4 SENSITIVE Sensitive     CEFTRIAXONE <=0.25 SENSITIVE Sensitive     CIPROFLOXACIN <=0.25 SENSITIVE Sensitive     GENTAMICIN <=1 SENSITIVE  Sensitive     IMIPENEM <=0.25 SENSITIVE Sensitive     NITROFURANTOIN 32 SENSITIVE Sensitive     TRIMETH/SULFA <=20 SENSITIVE Sensitive     AMPICILLIN/SULBACTAM 8 SENSITIVE Sensitive     PIP/TAZO 8 SENSITIVE Sensitive     * >=100,000 COLONIES/mL KLEBSIELLA PNEUMONIAE   Providencia stuartii - MIC*    AMPICILLIN RESISTANT Resistant     CEFAZOLIN >=64 RESISTANT Resistant     CEFTRIAXONE <=0.25 SENSITIVE Sensitive     CIPROFLOXACIN >=4 RESISTANT Resistant     GENTAMICIN RESISTANT Resistant     IMIPENEM 2 SENSITIVE Sensitive     NITROFURANTOIN 128 RESISTANT Resistant     TRIMETH/SULFA <=20 SENSITIVE Sensitive     AMPICILLIN/SULBACTAM 4 SENSITIVE Sensitive     PIP/TAZO 8 SENSITIVE Sensitive     * >=100,000 COLONIES/mL PROVIDENCIA STUARTII    Radiology Reports DG Chest Port 1 View  Result Date: 03/09/2019 CLINICAL DATA:  Altered mental status. EXAM: PORTABLE CHEST 1 VIEW COMPARISON:  04/04/2017 FINDINGS: The cardiac silhouette, mediastinal and hilar contours are within normal limits. There is mild tortuosity and calcification of the thoracic aorta. Patchy ill-defined airspace opacity the right lung base suspicious for pneumonia. No definite pleural effusion. The left lung is relatively clear. IMPRESSION: Right lower lobe infiltrate. Electronically Signed   By: Marijo Sanes M.D.   On: 03/20/2019 16:27

## 2019-04-02 NOTE — Progress Notes (Signed)
2200 Patients daughter(Deb) and son(Tommy) facetimed and was given an update. No questions asked.

## 2019-04-02 NOTE — Progress Notes (Addendum)
Daughter Deloris provided updates and approx videochat with patient. Patient remains lethargic, nonverbal. Eye opening to verbal stimuli. Prn ativan and morphine for anxiety/pain.

## 2019-04-03 LAB — CULTURE, BLOOD (ROUTINE X 2): Culture: NO GROWTH

## 2019-04-03 MED ORDER — MORPHINE 100MG IN NS 100ML (1MG/ML) PREMIX INFUSION
1.0000 mg/h | INTRAVENOUS | Status: DC
  Administered 2019-04-04: 1 mg/h via INTRAVENOUS
  Filled 2019-04-03: qty 100

## 2019-04-03 MED ORDER — MORPHINE BOLUS VIA INFUSION
1.0000 mg | INTRAVENOUS | Status: DC | PRN
  Filled 2019-04-03: qty 1

## 2019-04-03 NOTE — Progress Notes (Signed)
PROGRESS NOTE                                                                                                                                                                                                             Patient Demographics:    Amanda [name], is a 86 y.o. female, DOB - Apr 20, 1922, UKG:[id number]  Outpatient Primary MD for the patient is Christain Sacramento, MD    LOS - 5  Admit date - 03/22/2019    Chief Complaint  Patient presents with  . Altered Mental Status       Brief Narrative  Amanda [name] is a 86 y.o. female with medical history significant for COVID-19 diagnosis on 03/26/2027 at nursing home, chronic systolic CHF with EF 20 to 25%, persistent atrial fibrillation, on Coumadin, dementia, HLD, GERD, hypothyroidism, osteoporosis sent to the ER for increasing lethargy, work-up showed possible UTI, aspiration pneumonia, sepsis, dehydration, AKI and she was admitted.   Subjective:    Amanda [name] today remains in bed and appears to be in distress, moaning but unable to answer questions reliably.   Assessment  & Plan :   Patient was admitted for aspiration and COVID-19 pneumonia along with UTI, she was septic on admission, initially she responded well to supportive care and treatment however soon thereafter she became encephalopathic, stopped eating or drinking, eventually she started to moan and groan more more and appeared to be very uncomfortable.  Was unable to answer questions or follow commands reliably.  Due to her advanced age and poor baseline status she was transitioned to full comfort care night of 03/30/2019.  I also discussed this plan with patient's son and niece in detail.  Goal of care will be comfort.  If she survives she will go to residential hospice.  Expect her passing away in the next few days.   1. Dehydration, weakness, aspiration pneumonia caused by weakness due to COVID-19 infection - her  main pulmonary issues are due to aspiration pneumonia, could have underlying COVID-19 pneumonia as well but hard to confirm.  Treated with IV fluids, IV antibiotics, steroids and remdesivir.  Sepsis pathophysiology has resolved.Marland Kitchen   SpO2: 91 % O2 Flow Rate (L/min): 15 L/min  Recent Labs  Lab 03/14/2019 1642 03/30/19 0741 03/31/19 0211  CRP 36.9* 36.7* 26.7*  DDIMER 1.47* 1.75* 1.56*  FERRITIN 461*  --   --  BNP  --  485.5* 575.4*  PROCALCITON 2.27 1.76 1.58    Hepatic Function Latest Ref Rng & Units 03/31/2019 03/30/2019 03/24/2019  Total Protein 6.5 - 8.1 g/dL 6.1(L) 7.4 7.6  Albumin 3.5 - 5.0 g/dL 1.9(L) 2.4(L) 2.3(L)  AST 15 - 41 U/L 31 38 40  ALT 0 - 44 U/L 13 17 16   Alk Phosphatase 38 - 126 U/L 113 129(H) 128(H)  Total Bilirubin 0.3 - 1.2 mg/dL 0.7 1.0 1.1    2.  Dehydration with hypotension.  Was given IV fluids  3.  Chronic systolic heart failure with EF 20 to 25% along with chronic atrial fibrillation.  Mali vas 2 score of 3.  He knows on beta-blocker and Coumadin now goals of care full comfort.  4.  Hypothyroidism.  On home dose Synthroid with stable TSH.  5.  Sacral stage II pressure ulcer present on admission.  Nursing care, see nursing note for details.  6.  AKI on CKD stage III.  Baseline creatinine around 1-1/2 years to 2 years ago was 1.1.  Due to dehydration was hydrated but now on comfort measures.  7.  UTI present on admission.  Will stop all noncomfort treatment    Condition - Extremely Guarded  Family Communication  :  Son on home number [phone] , 03/26/2019, 03/30/19.  Clearly explained that due to her age her condition is tenuous and that we have to take it day by day.  She can pass away any day here or at SNF simply due to her age and comorbidities regardless of the Covid status.   Discussed in detail with son and his niece again on 03/31/2019.  Comfort measures now.   DW son 04/03/19   Code Status :  DNR  Diet :   Diet Order            DIET -  DYS 1 Room service appropriate? Yes; Fluid consistency: Nectar Thick  Diet effective now               Disposition Plan  : Residential hospice  Consults  : Speech therapy  Procedures  : None  PUD Prophylaxis : None  DVT Prophylaxis   Coumadin  Lab Results  Component Value Date   PLT 255 03/31/2019   Lab Results  Component Value Date   INR 2.5 (H) 03/31/2019   INR 2.8 (H) 03/30/2019   INR 3.1 (H) 03/26/2019     Inpatient Medications  Scheduled Meds: . Ipratropium-Albuterol  1 puff Inhalation Q6H  . levothyroxine  88 mcg Oral Q0600  . liver oil-zinc oxide   Topical BID   Continuous Infusions:  PRN Meds:.acetaminophen, guaiFENesin-dextromethorphan, haloperidol lactate, hydrALAZINE, lip balm, LORazepam **OR** LORazepam **OR** LORazepam, morphine injection, [DISCONTINUED] ondansetron **OR** ondansetron (ZOFRAN) IV, Resource ThickenUp Clear  Antibiotics  :    Anti-infectives (From admission, onward)   Start     Dose/Rate Route Frequency Ordered Stop   03/31/19 1530  vancomycin (VANCOREADY) IVPB 500 mg/100 mL  Status:  Discontinued     500 mg 100 mL/hr over 60 Minutes Intravenous Every 48 hours 03/14/2019 1737 03/31/19 0935   03/30/19 1600  cefTRIAXone (ROCEPHIN) 2 g in sodium chloride 0.9 % 100 mL IVPB  Status:  Discontinued     2 g 200 mL/hr over 30 Minutes Intravenous Every 24 hours 03/30/19 1234 03/31/19 0935   03/30/19 1245  metroNIDAZOLE (FLAGYL) tablet 500 mg  Status:  Discontinued     500 mg Oral Every 8 hours  03/30/19 1143 03/31/19 0935   03/30/19 1000  remdesivir 100 mg in sodium chloride 0.9 % 100 mL IVPB  Status:  Discontinued     100 mg 200 mL/hr over 30 Minutes Intravenous Daily 03/28/2019 1604 03/31/19 0935   03/13/2019 1615  remdesivir 200 mg in sodium chloride 0.9% 250 mL IVPB     200 mg 580 mL/hr over 30 Minutes Intravenous Once 04/03/2019 1604 03/09/2019 1657   03/30/2019 1600  ceFEPIme (MAXIPIME) 1 g in sodium chloride 0.9 % 100 mL IVPB  Status:   Discontinued     1 g 200 mL/hr over 30 Minutes Intravenous Every 24 hours 03/21/2019 1550 03/30/19 1234   04/06/2019 1545  vancomycin (VANCOCIN) IVPB 1000 mg/200 mL premix     1,000 mg 200 mL/hr over 60 Minutes Intravenous  Once 03/14/2019 1537 03/11/2019 1857   03/22/2019 1545  meropenem (MERREM) 1 g in sodium chloride 0.9 % 100 mL IVPB  Status:  Discontinued     1 g 200 mL/hr over 30 Minutes Intravenous  Once 03/14/2019 1537 04/03/2019 1545       Time Spent in minutes  30   Lala Lund M.D on 04/03/2019 at 9:27 AM  To page go to [email or web address] - password [facility]  [facility] -  Office  [phone]    See all Orders from today for further details    Objective:   Vitals:   04/01/19 0744 04/02/19 0733 04/02/19 2116 04/03/19 0800  BP: 132/87 102/71 100/66 95/74  Pulse: (!) 133 (!) 139 83 80  Resp:   19 20  Temp: (!) 97.4 F (36.3 C) 98.2 F (36.8 C) 98 F (36.7 C) 97.6 F (36.4 C)  TempSrc: Axillary Axillary Axillary Axillary  SpO2: 97% (!) 83% 91% 91%  Weight:      Height:        Wt Readings from Last 3 Encounters:  03/30/19 47.2 kg  04/07/17 47.2 kg  09/10/16 52.3 kg     Intake/Output Summary (Last 24 hours) at 04/03/2019 0927 Last data filed at 04/03/2019 0100 Gross per 24 hour  Intake 0 ml  Output 0 ml  Net 0 ml     Physical Exam  Extremely cachectic frail elderly white female lying in hospital bed moaning, unable to answer questions or follow commands Bilateral breath sounds Regular rate rhythm Abdomen soft No edema   Data Review:    CBC Recent Labs  Lab 03/22/2019 1457 03/31/2019 1656 03/30/19 0741 03/31/19 0211  WBC 22.5*  --  20.5* 15.4*  HGB 11.1* 10.9* 10.5* 9.9*  HCT 37.1 32.0* 35.6* 32.4*  PLT 280  --  263 255  MCV 96.4  --  95.2 95.0  MCH 28.8  --  28.1 29.0  MCHC 29.9*  --  29.5* 30.6  RDW 18.8*  --  19.2* 19.1*  LYMPHSABS  --   --  0.9 1.1  MONOABS  --   --  0.6 0.5  EOSABS  --   --  0.1 0.1  BASOSABS  --   --  0.1 0.1     Chemistries  Recent Labs  Lab 03/21/2019 1457 03/30/2019 1656 03/30/19 0741 03/31/19 0211  NA 140 140 143 146*  K 4.4 3.9 4.2 4.0  CL 99  --  105 110  CO2 25  --  24 25  GLUCOSE 117*  --  122* 117*  BUN 54*  --  55* 60*  CREATININE 2.02*  --  1.63* 1.47*  CALCIUM 8.9  --  8.4* 8.2*  MG  --   --   --  1.8  AST 40  --  38 31  ALT 16  --  17 13  ALKPHOS 128*  --  129* 113  BILITOT 1.1  --  1.0 0.7   ------------------------------------------------------------------------------------------------------------------ No results for input(s): CHOL, HDL, LDLCALC, TRIG, CHOLHDL, LDLDIRECT in the last 72 hours.  No results found for: HGBA1C ------------------------------------------------------------------------------------------------------------------ No results for input(s): TSH, T4TOTAL, T3FREE, THYROIDAB in the last 72 hours.  Invalid input(s): FREET3  Cardiac Enzymes No results for input(s): CKMB, TROPONINI, MYOGLOBIN in the last 168 hours.  Invalid input(s): CK ------------------------------------------------------------------------------------------------------------------    Component Value Date/Time   BNP 575.4 (H) 03/31/2019 0211    Micro Results Recent Results (from the past 240 hour(s))  Blood culture (routine x 2)     Status: None (Preliminary result)   Collection Time: 03/22/2019  2:58 PM   Specimen: BLOOD  Result Value Ref Range Status   Specimen Description BLOOD RIGHT ANTECUBITAL  Final   Special Requests   Final    BOTTLES DRAWN AEROBIC AND ANAEROBIC Blood Culture results may not be optimal due to an inadequate volume of blood received in culture bottles   Culture   Final    NO GROWTH 4 DAYS Performed at [facility], [place] [address], [place], [place] [address]    Report Status PENDING  Incomplete  Blood culture (routine x 2)     Status: Abnormal   Collection Time: 03/27/2019  2:58 PM   Specimen: BLOOD  Result Value Ref Range Status   Specimen  Description BLOOD LEFT ANTECUBITAL  Final   Special Requests   Final    BOTTLES DRAWN AEROBIC ONLY Blood Culture adequate volume   Culture  Setup Time   Final    GRAM POSITIVE COCCI IN CLUSTERS AEROBIC BOTTLE ONLY CRITICAL RESULT CALLED TO, READ BACK BY AND VERIFIED WITH: [place] [id number] FCP Performed at [facility], [place] [address] [place], [place] [address]    Culture METHICILLIN RESISTANT STAPHYLOCOCCUS AUREUS (A)  Final   Report Status 04/01/2019 FINAL  Final   Organism ID, Bacteria METHICILLIN RESISTANT STAPHYLOCOCCUS AUREUS  Final      Susceptibility   Methicillin resistant staphylococcus aureus - MIC*    CIPROFLOXACIN >=8 RESISTANT Resistant     ERYTHROMYCIN >=8 RESISTANT Resistant     GENTAMICIN <=0.5 SENSITIVE Sensitive     OXACILLIN >=4 RESISTANT Resistant     TETRACYCLINE <=1 SENSITIVE Sensitive     VANCOMYCIN 1 SENSITIVE Sensitive     TRIMETH/SULFA <=10 SENSITIVE Sensitive     CLINDAMYCIN <=0.25 SENSITIVE Sensitive     RIFAMPIN <=0.5 SENSITIVE Sensitive     Inducible Clindamycin NEGATIVE Sensitive     * METHICILLIN RESISTANT STAPHYLOCOCCUS AUREUS  Urine culture     Status: Abnormal   Collection Time: 04/05/2019  4:44 PM   Specimen: Urine, Clean Catch  Result Value Ref Range Status   Specimen Description URINE, CLEAN CATCH  Final   Special Requests   Final    NONE Performed at [facility], [place] [address] [place], [place] [address]    Culture (A)  Final    >=100,000 COLONIES/mL PROVIDENCIA STUARTII >=100,000 COLONIES/mL KLEBSIELLA PNEUMONIAE    Report Status 04/01/2019 FINAL  Final   Organism ID, Bacteria PROVIDENCIA STUARTII (A)  Final   Organism ID, Bacteria KLEBSIELLA PNEUMONIAE (A)  Final      Susceptibility   Klebsiella pneumoniae -  MIC*    AMPICILLIN >=32 RESISTANT Resistant     CEFAZOLIN <=4 SENSITIVE Sensitive     CEFTRIAXONE <=0.25 SENSITIVE Sensitive     CIPROFLOXACIN <=0.25 SENSITIVE Sensitive     GENTAMICIN <=1 SENSITIVE  Sensitive     IMIPENEM <=0.25 SENSITIVE Sensitive     NITROFURANTOIN 32 SENSITIVE Sensitive     TRIMETH/SULFA <=20 SENSITIVE Sensitive     AMPICILLIN/SULBACTAM 8 SENSITIVE Sensitive     PIP/TAZO 8 SENSITIVE Sensitive     * >=100,000 COLONIES/mL KLEBSIELLA PNEUMONIAE   Providencia stuartii - MIC*    AMPICILLIN RESISTANT Resistant     CEFAZOLIN >=64 RESISTANT Resistant     CEFTRIAXONE <=0.25 SENSITIVE Sensitive     CIPROFLOXACIN >=4 RESISTANT Resistant     GENTAMICIN RESISTANT Resistant     IMIPENEM 2 SENSITIVE Sensitive     NITROFURANTOIN 128 RESISTANT Resistant     TRIMETH/SULFA <=20 SENSITIVE Sensitive     AMPICILLIN/SULBACTAM 4 SENSITIVE Sensitive     PIP/TAZO 8 SENSITIVE Sensitive     * >=100,000 COLONIES/mL PROVIDENCIA STUARTII    Radiology Reports DG Chest Port 1 View  Result Date: 03/14/2019 CLINICAL DATA:  Altered mental status. EXAM: PORTABLE CHEST 1 VIEW COMPARISON:  04/04/2017 FINDINGS: The cardiac silhouette, mediastinal and hilar contours are within normal limits. There is mild tortuosity and calcification of the thoracic aorta. Patchy ill-defined airspace opacity the right lung base suspicious for pneumonia. No definite pleural effusion. The left lung is relatively clear. IMPRESSION: Right lower lobe infiltrate. Electronically Signed   By: Marijo Sanes M.D.   On: 03/12/2019 16:27

## 2019-04-03 NOTE — Progress Notes (Signed)
Facetime family Dashay Giesler  Pt son.  Daughter will call back in a few min

## 2019-04-04 DIAGNOSIS — I5022 Chronic systolic (congestive) heart failure: Secondary | ICD-10-CM

## 2019-04-04 DIAGNOSIS — G9349 Other encephalopathy: Secondary | ICD-10-CM

## 2019-04-09 NOTE — Progress Notes (Addendum)
1210 Pt passed peacefully at 12:10  Notified pt son Orvilla Fus and daughter Cumberland Lions. Called [facility] and spoke to [place] with [facility] and [facility] in prepping pt for transfer to downstairs.  Pt Dr will be by to sign Death Certificate.  Called Pharmacy and got specific instructions on discontinuing Morphine gtt Spoke to [employer] Pharmacist and Wyona Almas RN assisted me in disposing of Morphine gtt.  Estimated 100cc remaining.

## 2019-04-09 NOTE — Progress Notes (Signed)
PROGRESS NOTE                                                                                                                                                                                                             Patient Demographics:    Amanda [name], is a 86 y.o. female, DOB - 1922/06/05, VZS:[id number]  Outpatient Primary MD for the patient is Christain Sacramento, MD    LOS - 6  Admit date - 04/05/2019    Chief Complaint  Patient presents with  . Altered Mental Status       Brief Narrative  Amanda [name] is a 86 y.o. female with medical history significant for COVID-19 diagnosis on 03/26/2027 at nursing home, chronic systolic CHF with EF 20 to 25%, persistent atrial fibrillation, on Coumadin, dementia, HLD, GERD, hypothyroidism, osteoporosis sent to the ER for increasing lethargy, work-up showed possible UTI, aspiration pneumonia, sepsis, dehydration, AKI and she was admitted.   Subjective:    Amanda [name] today remains in bed , responsive, comfortable .   Assessment  & Plan :   Patient was admitted for aspiration and COVID-19 pneumonia along with UTI, she was septic on admission, initially she responded well to supportive care and treatment however soon thereafter she became encephalopathic, stopped eating or drinking, eventually she started to moan and groan more more and appeared to be very uncomfortable.  Was unable to answer questions or follow commands reliably.  Due to her advanced age and poor baseline status she was transitioned to full comfort care night of 03/30/2019.  Previous MD discussed this plan with patient's son and niece in detail.  Goal of care will be comfort.  Patient currently appears to be with agonal breathing, and death anticipated during hospital stay .  1. Dehydration, weakness, aspiration pneumonia caused by weakness due to COVID-19 infection - her main pulmonary issues are due to aspiration  pneumonia, could have underlying COVID-19 pneumonia as well but hard to confirm.  Treated with IV fluids, IV antibiotics, steroids and remdesivir.  Sepsis pathophysiology has resolved.Marland Kitchen   SpO2: (!) 77 % O2 Flow Rate (L/min): 15 L/min  Recent Labs  Lab 03/24/2019 1642 03/30/19 0741 03/31/19 0211  CRP 36.9* 36.7* 26.7*  DDIMER 1.47* 1.75* 1.56*  FERRITIN 461*  --   --   BNP  --  485.5* 575.4*  PROCALCITON 2.27 1.76 1.58    Hepatic Function Latest Ref Rng & Units 03/31/2019 03/30/2019 03/21/2019  Total Protein 6.5 - 8.1 g/dL 6.1(L) 7.4 7.6  Albumin 3.5 - 5.0 g/dL 1.9(L) 2.4(L) 2.3(L)  AST 15 - 41 U/L 31 38 40  ALT 0 - 44 U/L 13 17 16   Alk Phosphatase 38 - 126 U/L 113 129(H) 128(H)  Total Bilirubin 0.3 - 1.2 mg/dL 0.7 1.0 1.1    2.  Dehydration with hypotension.  Was given IV fluids  3.  Chronic systolic heart failure with EF 20 to 25% along with chronic atrial fibrillation.  Mali vas 2 score of 3.  He knows on beta-blocker and Coumadin now goals of care full comfort.  4.  Hypothyroidism.  On home dose Synthroid with stable TSH.  5.  Sacral stage II pressure ulcer present on admission.  Nursing care, see nursing note for details.  6.  AKI on CKD stage III.  Baseline creatinine around 1-1/2 years to 2 years ago was 1.1.  Due to dehydration was hydrated but now on comfort measures.  7.  UTI present on admission.  Will stop all noncomfort treatment    Condition -death expected during hospital stay   Code Status :  DNR  Diet :   Diet Order            DIET - DYS 1 Room service appropriate? Yes; Fluid consistency: Nectar Thick  Diet effective now               Disposition Plan  : Residential hospice  Consults  : Speech therapy  Procedures  : None  PUD Prophylaxis : None  DVT Prophylaxis   Coumadin  Lab Results  Component Value Date   PLT 255 03/31/2019   Lab Results  Component Value Date   INR 2.5 (H) 03/31/2019   INR 2.8 (H) 03/30/2019   INR 3.1 (H)  03/14/2019     Inpatient Medications  Scheduled Meds: . Ipratropium-Albuterol  1 puff Inhalation Q6H  . levothyroxine  88 mcg Oral Q0600  . liver oil-zinc oxide   Topical BID   Continuous Infusions: . morphine 1 mg/hr (2019-04-18 0012)   PRN Meds:.acetaminophen, guaiFENesin-dextromethorphan, haloperidol lactate, hydrALAZINE, lip balm, LORazepam **OR** LORazepam **OR** LORazepam, morphine, [DISCONTINUED] ondansetron **OR** ondansetron (ZOFRAN) IV, Resource ThickenUp Clear  Antibiotics  :    Anti-infectives (From admission, onward)   Start     Dose/Rate Route Frequency Ordered Stop   03/31/19 1530  vancomycin (VANCOREADY) IVPB 500 mg/100 mL  Status:  Discontinued     500 mg 100 mL/hr over 60 Minutes Intravenous Every 48 hours 04/01/2019 1737 03/31/19 0935   03/30/19 1600  cefTRIAXone (ROCEPHIN) 2 g in sodium chloride 0.9 % 100 mL IVPB  Status:  Discontinued     2 g 200 mL/hr over 30 Minutes Intravenous Every 24 hours 03/30/19 1234 03/31/19 0935   03/30/19 1245  metroNIDAZOLE (FLAGYL) tablet 500 mg  Status:  Discontinued     500 mg Oral Every 8 hours 03/30/19 1143 03/31/19 0935   03/30/19 1000  remdesivir 100 mg in sodium chloride 0.9 % 100 mL IVPB  Status:  Discontinued     100 mg 200 mL/hr over 30 Minutes Intravenous Daily 03/25/2019 1604 03/31/19 0935   03/18/2019 1615  remdesivir 200 mg in sodium chloride 0.9% 250 mL IVPB     200 mg 580 mL/hr over 30 Minutes Intravenous Once 03/28/2019 1604 04/05/2019 1657   03/27/2019 1600  ceFEPIme (MAXIPIME) 1  g in sodium chloride 0.9 % 100 mL IVPB  Status:  Discontinued     1 g 200 mL/hr over 30 Minutes Intravenous Every 24 hours 03/11/2019 1550 03/30/19 1234   04/05/2019 1545  vancomycin (VANCOCIN) IVPB 1000 mg/200 mL premix     1,000 mg 200 mL/hr over 60 Minutes Intravenous  Once 03/19/2019 1537 04/01/2019 1857   04/06/2019 1545  meropenem (MERREM) 1 g in sodium chloride 0.9 % 100 mL IVPB  Status:  Discontinued     1 g 200 mL/hr over 30 Minutes Intravenous   Once 03/14/2019 1537 03/21/2019 1545        Jaecob Lowden M.D on 05-03-19 at 10:31 AM  To page go to [email or web address] - password [place]  [facility] -  Office  [phone]    See all Orders from today for further details    Objective:   Vitals:   04/02/19 2116 04/03/19 0800 04/03/19 1955 2019/05/03 0900  BP: 100/66 95/74 94/71  (!) 88/55  Pulse: 83 80 (!) 178 (!) 136  Resp: 19 20 18 20   Temp: 98 F (36.7 C) 97.6 F (36.4 C) (!) 96.9 F (36.1 C) 98.1 F (36.7 C)  TempSrc: Axillary Axillary Axillary Axillary  SpO2: 91% 91% (!) 83% (!) 77%  Weight:      Height:        Wt Readings from Last 3 Encounters:  03/30/19 47.2 kg  04/07/17 47.2 kg  09/10/16 52.3 kg    No intake or output data in the 24 hours ending 05-03-2019 1031   Physical Exam  Extremely cachectic frail elderly female, laying in bed, obtunded, unresponsive, with shallow agonal breathing, but appears comfortable .    Data Review:    CBC Recent Labs  Lab 04/08/2019 1457 03/17/2019 1656 03/30/19 0741 03/31/19 0211  WBC 22.5*  --  20.5* 15.4*  HGB 11.1* 10.9* 10.5* 9.9*  HCT 37.1 32.0* 35.6* 32.4*  PLT 280  --  263 255  MCV 96.4  --  95.2 95.0  MCH 28.8  --  28.1 29.0  MCHC 29.9*  --  29.5* 30.6  RDW 18.8*  --  19.2* 19.1*  LYMPHSABS  --   --  0.9 1.1  MONOABS  --   --  0.6 0.5  EOSABS  --   --  0.1 0.1  BASOSABS  --   --  0.1 0.1    Chemistries  Recent Labs  Lab 04/06/2019 1457 03/10/2019 1656 03/30/19 0741 03/31/19 0211  NA 140 140 143 146*  K 4.4 3.9 4.2 4.0  CL 99  --  105 110  CO2 25  --  24 25  GLUCOSE 117*  --  122* 117*  BUN 54*  --  55* 60*  CREATININE 2.02*  --  1.63* 1.47*  CALCIUM 8.9  --  8.4* 8.2*  MG  --   --   --  1.8  AST 40  --  38 31  ALT 16  --  17 13  ALKPHOS 128*  --  129* 113  BILITOT 1.1  --  1.0 0.7   ------------------------------------------------------------------------------------------------------------------ No results for input(s): CHOL, HDL,  LDLCALC, TRIG, CHOLHDL, LDLDIRECT in the last 72 hours.  No results found for: HGBA1C ------------------------------------------------------------------------------------------------------------------ No results for input(s): TSH, T4TOTAL, T3FREE, THYROIDAB in the last 72 hours.  Invalid input(s): FREET3  Cardiac Enzymes No results for input(s): CKMB, TROPONINI, MYOGLOBIN in the last 168 hours.  Invalid input(s): CK ------------------------------------------------------------------------------------------------------------------    Component Value Date/Time  BNP 575.4 (H) 03/31/2019 0211    Micro Results Recent Results (from the past 240 hour(s))  Blood culture (routine x 2)     Status: None   Collection Time: 03/30/2019  2:58 PM   Specimen: BLOOD  Result Value Ref Range Status   Specimen Description BLOOD RIGHT ANTECUBITAL  Final   Special Requests   Final    BOTTLES DRAWN AEROBIC AND ANAEROBIC Blood Culture results may not be optimal due to an inadequate volume of blood received in culture bottles   Culture   Final    NO GROWTH 5 DAYS Performed at [facility], [place] [address], [place], [place] [address]    Report Status 04/03/2019 FINAL  Final  Blood culture (routine x 2)     Status: Abnormal   Collection Time: 03/15/2019  2:58 PM   Specimen: BLOOD  Result Value Ref Range Status   Specimen Description BLOOD LEFT ANTECUBITAL  Final   Special Requests   Final    BOTTLES DRAWN AEROBIC ONLY Blood Culture adequate volume   Culture  Setup Time   Final    GRAM POSITIVE COCCI IN CLUSTERS AEROBIC BOTTLE ONLY CRITICAL RESULT CALLED TO, READ BACK BY AND VERIFIED WITH: [place] [id number] FCP Performed at [facility], [place] [address], [place], [place] [address]    Culture METHICILLIN RESISTANT STAPHYLOCOCCUS AUREUS (A)  Final   Report Status 04/01/2019 FINAL  Final   Organism ID, Bacteria METHICILLIN RESISTANT STAPHYLOCOCCUS AUREUS  Final      Susceptibility     Methicillin resistant staphylococcus aureus - MIC*    CIPROFLOXACIN >=8 RESISTANT Resistant     ERYTHROMYCIN >=8 RESISTANT Resistant     GENTAMICIN <=0.5 SENSITIVE Sensitive     OXACILLIN >=4 RESISTANT Resistant     TETRACYCLINE <=1 SENSITIVE Sensitive     VANCOMYCIN 1 SENSITIVE Sensitive     TRIMETH/SULFA <=10 SENSITIVE Sensitive     CLINDAMYCIN <=0.25 SENSITIVE Sensitive     RIFAMPIN <=0.5 SENSITIVE Sensitive     Inducible Clindamycin NEGATIVE Sensitive     * METHICILLIN RESISTANT STAPHYLOCOCCUS AUREUS  Urine culture     Status: Abnormal   Collection Time: 03/13/2019  4:44 PM   Specimen: Urine, Clean Catch  Result Value Ref Range Status   Specimen Description URINE, CLEAN CATCH  Final   Special Requests   Final    NONE Performed at [facility], [place] [address], [place], [place] [address]    Culture (A)  Final    >=100,000 COLONIES/mL PROVIDENCIA STUARTII >=100,000 COLONIES/mL KLEBSIELLA PNEUMONIAE    Report Status 04/01/2019 FINAL  Final   Organism ID, Bacteria PROVIDENCIA STUARTII (A)  Final   Organism ID, Bacteria KLEBSIELLA PNEUMONIAE (A)  Final      Susceptibility   Klebsiella pneumoniae - MIC*    AMPICILLIN >=32 RESISTANT Resistant     CEFAZOLIN <=4 SENSITIVE Sensitive     CEFTRIAXONE <=0.25 SENSITIVE Sensitive     CIPROFLOXACIN <=0.25 SENSITIVE Sensitive     GENTAMICIN <=1 SENSITIVE Sensitive     IMIPENEM <=0.25 SENSITIVE Sensitive     NITROFURANTOIN 32 SENSITIVE Sensitive     TRIMETH/SULFA <=20 SENSITIVE Sensitive     AMPICILLIN/SULBACTAM 8 SENSITIVE Sensitive     PIP/TAZO 8 SENSITIVE Sensitive     * >=100,000 COLONIES/mL KLEBSIELLA PNEUMONIAE   Providencia stuartii - MIC*    AMPICILLIN RESISTANT Resistant     CEFAZOLIN >=64 RESISTANT Resistant     CEFTRIAXONE <=0.25  SENSITIVE Sensitive     CIPROFLOXACIN >=4 RESISTANT Resistant     GENTAMICIN RESISTANT Resistant     IMIPENEM 2 SENSITIVE Sensitive     NITROFURANTOIN 128 RESISTANT Resistant      TRIMETH/SULFA <=20 SENSITIVE Sensitive     AMPICILLIN/SULBACTAM 4 SENSITIVE Sensitive     PIP/TAZO 8 SENSITIVE Sensitive     * >=100,000 COLONIES/mL PROVIDENCIA STUARTII    Radiology Reports DG Chest Port 1 View  Result Date: 03/20/2019 CLINICAL DATA:  Altered mental status. EXAM: PORTABLE CHEST 1 VIEW COMPARISON:  04/04/2017 FINDINGS: The cardiac silhouette, mediastinal and hilar contours are within normal limits. There is mild tortuosity and calcification of the thoracic aorta. Patchy ill-defined airspace opacity the right lung base suspicious for pneumonia. No definite pleural effusion. The left lung is relatively clear. IMPRESSION: Right lower lobe infiltrate. Electronically Signed   By: Marijo Sanes M.D.   On: 04/08/2019 16:27

## 2019-04-09 NOTE — Death Summary Note (Signed)
DEATH SUMMARY   Patient Details  Name: Amanda [name] MRN: [id number] DOB: December 04, 1922  Admission/Discharge Information   Admit Date:  04-13-19  Date of Death: Date of Death: Apr 19, 2019  Time of Death: Time of Death: 07/02/1208  Length of Stay: 6  Referring Physician: Barbie Banner, MD   Reason(s) for Hospitalization  Acute hypoxic respiratory failure due to COVID-19 pneumonia  Diagnoses  Preliminary cause of death:   COVID-19 pneumonia  Secondary Diagnoses (including complications and co-morbidities):  Chronic systolic CHF AKI on CKD stage III  Principal Problem:   Acute hypoxemic respiratory failure due to COVID-19 [facility]) Active Problems:   Hyperlipidemia   Essential hypertension   Atrial fibrillation, persistent (HCC)   DNR (do not resuscitate) discussion   Acute respiratory failure with hypoxia (HCC)   Pressure injury of skin   Chronic systolic CHF (congestive heart failure) (HCC)   Encephalopathy due to COVID-19 virus   Brief Hospital Course (including significant findings, care, treatment, and services provided and events leading to death)  Amanda [name] is a 86 y.o. year old female who is at baseline with significant dementia, SNF resident, known chronic systolic CHF, with severely depressed EF 20 to 25%, persistent atrial fibrillation on warfarin, hyperlipidemia, GERD, hypothyroidism, osteoporosis was sent to ED secondary to encephalopathy, increased lethargy, she was diagnosed with COVID-19 on 1/18, she was unable to tolerate an oral intake few days prior to presentation, in ED she was noted to have sepsis, with lactic acidosis, tachycardic, febrile and hypoxic on presentation, and A. fib with RVR, significant hypoxia 83% on room air, requiring 4 L nasal cannula, patient was appropriately resuscitated with IV fluids, treated with broad-spectrum IV antibiotics in ED, as well noted to have AKI, on baseline CKD stage IIIb as well, as well with clinical suspicion of aspiration  pneumonia in the setting of encephalopathy due to COVID-19 for pneumonia, she was treated with IV fluids, IV antibiotics, steroids, and remdesivir, as well as she was noted to have UTI present on admission, remains encephalopathic during hospital stay, with no oral intake, failure to thrive, with she appears to be uncomfortable, she did used to moan and groan, and due to her advanced age, poor baseline status, she was transitioned to comfort care after discussion with the family 03/30/2019, patient was kept on comfort measures during hospital stay, she passed away April 19, 2019 at 12:10 PM.   Pertinent Labs and Studies  Significant Diagnostic Studies DG Chest Port 1 View  Result Date: 04-13-2019 CLINICAL DATA:  Altered mental status. EXAM: PORTABLE CHEST 1 VIEW COMPARISON:  04/04/2017 FINDINGS: The cardiac silhouette, mediastinal and hilar contours are within normal limits. There is mild tortuosity and calcification of the thoracic aorta. Patchy ill-defined airspace opacity the right lung base suspicious for pneumonia. No definite pleural effusion. The left lung is relatively clear. IMPRESSION: Right lower lobe infiltrate. Electronically Signed   By: Rudie Meyer M.D.   On: 13-Apr-2019 16:27    Microbiology Recent Results (from the past 240 hour(s))  Blood culture (routine x 2)     Status: None   Collection Time: 04-13-2019  2:58 PM   Specimen: BLOOD  Result Value Ref Range Status   Specimen Description BLOOD RIGHT ANTECUBITAL  Final   Special Requests   Final    BOTTLES DRAWN AEROBIC AND ANAEROBIC Blood Culture results may not be optimal due to an inadequate volume of blood received in culture bottles   Culture   Final    NO GROWTH 5 DAYS  Performed at [facility], [place] [address], [place], [place] [address]    Report Status 04/03/2019 FINAL  Final  Blood culture (routine x 2)     Status: Abnormal   Collection Time: 03/12/2019  2:58 PM   Specimen: BLOOD  Result Value Ref Range Status    Specimen Description BLOOD LEFT ANTECUBITAL  Final   Special Requests   Final    BOTTLES DRAWN AEROBIC ONLY Blood Culture adequate volume   Culture  Setup Time   Final    GRAM POSITIVE COCCI IN CLUSTERS AEROBIC BOTTLE ONLY CRITICAL RESULT CALLED TO, READ BACK BY AND VERIFIED WITH: [place] [id number] FCP Performed at [facility], [place] [address] [place], [place] [address]    Culture METHICILLIN RESISTANT STAPHYLOCOCCUS AUREUS (A)  Final   Report Status 04/01/2019 FINAL  Final   Organism ID, Bacteria METHICILLIN RESISTANT STAPHYLOCOCCUS AUREUS  Final      Susceptibility   Methicillin resistant staphylococcus aureus - MIC*    CIPROFLOXACIN >=8 RESISTANT Resistant     ERYTHROMYCIN >=8 RESISTANT Resistant     GENTAMICIN <=0.5 SENSITIVE Sensitive     OXACILLIN >=4 RESISTANT Resistant     TETRACYCLINE <=1 SENSITIVE Sensitive     VANCOMYCIN 1 SENSITIVE Sensitive     TRIMETH/SULFA <=10 SENSITIVE Sensitive     CLINDAMYCIN <=0.25 SENSITIVE Sensitive     RIFAMPIN <=0.5 SENSITIVE Sensitive     Inducible Clindamycin NEGATIVE Sensitive     * METHICILLIN RESISTANT STAPHYLOCOCCUS AUREUS  Urine culture     Status: Abnormal   Collection Time: 04/05/2019  4:44 PM   Specimen: Urine, Clean Catch  Result Value Ref Range Status   Specimen Description URINE, CLEAN CATCH  Final   Special Requests   Final    NONE Performed at [facility], [place] [address] [place], [place] [address]    Culture (A)  Final    >=100,000 COLONIES/mL PROVIDENCIA STUARTII >=100,000 COLONIES/mL KLEBSIELLA PNEUMONIAE    Report Status 04/01/2019 FINAL  Final   Organism ID, Bacteria PROVIDENCIA STUARTII (A)  Final   Organism ID, Bacteria KLEBSIELLA PNEUMONIAE (A)  Final      Susceptibility   Klebsiella pneumoniae - MIC*    AMPICILLIN >=32 RESISTANT Resistant     CEFAZOLIN <=4 SENSITIVE Sensitive     CEFTRIAXONE <=0.25 SENSITIVE Sensitive     CIPROFLOXACIN <=0.25 SENSITIVE Sensitive     GENTAMICIN <=1  SENSITIVE Sensitive     IMIPENEM <=0.25 SENSITIVE Sensitive     NITROFURANTOIN 32 SENSITIVE Sensitive     TRIMETH/SULFA <=20 SENSITIVE Sensitive     AMPICILLIN/SULBACTAM 8 SENSITIVE Sensitive     PIP/TAZO 8 SENSITIVE Sensitive     * >=100,000 COLONIES/mL KLEBSIELLA PNEUMONIAE   Providencia stuartii - MIC*    AMPICILLIN RESISTANT Resistant     CEFAZOLIN >=64 RESISTANT Resistant     CEFTRIAXONE <=0.25 SENSITIVE Sensitive     CIPROFLOXACIN >=4 RESISTANT Resistant     GENTAMICIN RESISTANT Resistant     IMIPENEM 2 SENSITIVE Sensitive     NITROFURANTOIN 128 RESISTANT Resistant     TRIMETH/SULFA <=20 SENSITIVE Sensitive     AMPICILLIN/SULBACTAM 4 SENSITIVE Sensitive     PIP/TAZO 8 SENSITIVE Sensitive     * >=100,000 COLONIES/mL PROVIDENCIA STUARTII    Lab Basic Metabolic Panel: Recent Labs  Lab 04/03/2019 1457 03/17/2019 1656 03/30/19 0741 03/31/19 0211  NA 140 140 143 146*  K 4.4 3.9 4.2 4.0  CL 99  --  105 110  CO2 25  --  24 25  GLUCOSE 117*  --  122* 117*  BUN 54*  --  55* 60*  CREATININE 2.02*  --  1.63* 1.47*  CALCIUM 8.9  --  8.4* 8.2*  MG  --   --   --  1.8   Liver Function Tests: Recent Labs  Lab 04/03/2019 1457 03/30/19 0741 03/31/19 0211  AST 40 38 31  ALT 16 17 13   ALKPHOS 128* 129* 113  BILITOT 1.1 1.0 0.7  PROT 7.6 7.4 6.1*  ALBUMIN 2.3* 2.4* 1.9*   No results for input(s): LIPASE, AMYLASE in the last 168 hours. No results for input(s): AMMONIA in the last 168 hours. CBC: Recent Labs  Lab 03/11/2019 1457 03/11/2019 1656 03/30/19 0741 03/31/19 0211  WBC 22.5*  --  20.5* 15.4*  NEUTROABS  --   --  18.6* 13.5*  HGB 11.1* 10.9* 10.5* 9.9*  HCT 37.1 32.0* 35.6* 32.4*  MCV 96.4  --  95.2 95.0  PLT 280  --  263 255   Cardiac Enzymes: No results for input(s): CKTOTAL, CKMB, CKMBINDEX, TROPONINI in the last 168 hours. Sepsis Labs: Recent Labs  Lab 03/17/2019 1457 04/07/2019 1642 03/30/19 0741 03/31/19 0211  PROCALCITON  --  2.27 1.76 1.58  WBC 22.5*  --   20.5* 15.4*  LATICACIDVEN 2.3* 2.3*  --   --     Procedures/Operations     04/02/19 04-20-19, 5:42 PM

## 2019-04-09 DEATH — deceased
